# Patient Record
Sex: Female | Born: 1970
Health system: Southern US, Community
[De-identification: ages and names within clinical notes are randomized; demographics above are authoritative.]

## PROBLEM LIST (undated history)

## (undated) DIAGNOSIS — R519 Headache, unspecified: Secondary | ICD-10-CM

## (undated) DIAGNOSIS — R7303 Prediabetes: Secondary | ICD-10-CM

## (undated) DIAGNOSIS — E669 Obesity, unspecified: Secondary | ICD-10-CM

## (undated) DIAGNOSIS — Z87891 Personal history of nicotine dependence: Secondary | ICD-10-CM

## (undated) DIAGNOSIS — M549 Dorsalgia, unspecified: Secondary | ICD-10-CM

## (undated) DIAGNOSIS — M5010 Cervical disc disorder with radiculopathy, unspecified cervical region: Secondary | ICD-10-CM

## (undated) DIAGNOSIS — D649 Anemia, unspecified: Secondary | ICD-10-CM

## (undated) DIAGNOSIS — Z9889 Other specified postprocedural states: Secondary | ICD-10-CM

## (undated) DIAGNOSIS — J45909 Unspecified asthma, uncomplicated: Secondary | ICD-10-CM

## (undated) DIAGNOSIS — K635 Polyp of colon: Secondary | ICD-10-CM

## (undated) DIAGNOSIS — D72829 Elevated white blood cell count, unspecified: Secondary | ICD-10-CM

## (undated) DIAGNOSIS — K353 Acute appendicitis with localized peritonitis, without perforation or gangrene: Secondary | ICD-10-CM

## (undated) DIAGNOSIS — M51369 Other intervertebral disc degeneration, lumbar region without mention of lumbar back pain or lower extremity pain: Secondary | ICD-10-CM

## (undated) DIAGNOSIS — T8859XA Other complications of anesthesia, initial encounter: Secondary | ICD-10-CM

## (undated) DIAGNOSIS — F909 Attention-deficit hyperactivity disorder, unspecified type: Secondary | ICD-10-CM

## (undated) DIAGNOSIS — R112 Nausea with vomiting, unspecified: Secondary | ICD-10-CM

## (undated) DIAGNOSIS — M4802 Spinal stenosis, cervical region: Secondary | ICD-10-CM

## (undated) DIAGNOSIS — K219 Gastro-esophageal reflux disease without esophagitis: Secondary | ICD-10-CM

## (undated) HISTORY — DX: Attention-deficit hyperactivity disorder, unspecified type: F90.9

## (undated) HISTORY — PX: CHOLECYSTECTOMY: SHX55

## (undated) HISTORY — PX: HERNIA REPAIR: SHX51

## (undated) HISTORY — PX: BIOPSY BREAST: PRO8

## (undated) HISTORY — DX: Acute appendicitis with localized peritonitis, without perforation or gangrene: K35.30

---

## 2015-02-05 DIAGNOSIS — F909 Attention-deficit hyperactivity disorder, unspecified type: Secondary | ICD-10-CM

## 2015-02-05 HISTORY — DX: Attention-deficit hyperactivity disorder, unspecified type: F90.9

## 2015-09-05 ENCOUNTER — Emergency Department
Admission: EM | Admit: 2015-09-05 | Discharge: 2015-09-05 | Disposition: A | Discharge status: Home / Self Care | Payer: Medicaid Other | Attending: Emergency Medicine | Admitting: Emergency Medicine

## 2015-09-05 ENCOUNTER — Encounter: Payer: Self-pay | Admitting: Emergency Medicine

## 2015-09-05 DIAGNOSIS — Z79899 Other long term (current) drug therapy: Secondary | ICD-10-CM | POA: Insufficient documentation

## 2015-09-05 DIAGNOSIS — M545 Low back pain, unspecified: Secondary | ICD-10-CM

## 2015-09-05 DIAGNOSIS — F17210 Nicotine dependence, cigarettes, uncomplicated: Secondary | ICD-10-CM | POA: Insufficient documentation

## 2015-09-05 DIAGNOSIS — G8929 Other chronic pain: Secondary | ICD-10-CM | POA: Insufficient documentation

## 2015-09-05 HISTORY — DX: Dorsalgia, unspecified: M54.9

## 2015-09-05 MED ORDER — PREGABALIN 150 MG PO CAPS: 150.0000 mg | ORAL_CAPSULE | Freq: Two times a day (BID) | ORAL | Status: DC

## 2015-09-05 MED ORDER — BACLOFEN 10 MG PO TABS: 10.0000 mg | ORAL_TABLET | Freq: Two times a day (BID) | ORAL | Status: DC

## 2015-09-05 MED ORDER — HYDROCODONE-ACETAMINOPHEN 5-325 MG PO TABS: 1.0000 | ORAL_TABLET | Freq: Two times a day (BID) | ORAL | Status: DC | PRN

## 2015-09-05 NOTE — ED Notes (Signed)
Low back pain increasing over past week. History of disc problems and back pain. No new injury.

## 2015-09-05 NOTE — Discharge Instructions (Signed)
Chronic Back Pain  When back pain lasts longer than 3 months, it is called chronic back pain.People with chronic back pain often go through certain periods that are more intense (flare-ups).  CAUSES Chronic back pain can be caused by wear and tear (degeneration) on different structures in your back. These structures include:  The bones of your spine (vertebrae) and the joints surrounding your spinal cord and nerve roots (facets).  The strong, fibrous tissues that connect your vertebrae (ligaments). Degeneration of these structures may result in pressure on your nerves. This can lead to constant pain. HOME CARE INSTRUCTIONS  Avoid bending, heavy lifting, prolonged sitting, and activities which make the problem worse.  Take brief periods of rest throughout the day to reduce your pain. Lying down or standing usually is better than sitting while you are resting.  Take over-the-counter or prescription medicines only as directed by your caregiver. SEEK IMMEDIATE MEDICAL CARE IF:   You have weakness or numbness in one of your legs or feet.  You have trouble controlling your bladder or bowels.  You have nausea, vomiting, abdominal pain, shortness of breath, or fainting.   This information is not intended to replace advice given to you by your health care provider. Make sure you discuss any questions you have with your health care provider.   Document Released: 11/02/2004 Document Revised: 12/18/2011 Document Reviewed: 03/15/2015 Elsevier Interactive Patient Education [employer].  Emergency care providers appreciate that many patients coming to [place] are in severe pain and we wish to address their pain in the safest, most responsible manner.  It is important to recognize, however, that the proper treatment of chronic pain differs from that of the pain of injuries and acute illnesses.  Our goal is to provider quality, safe, personalized care and we thank you for giving [place] the opportunity to  serve you.  The use of narcotics and related agents for chronic pain syndromes may lead to additional physical and psychological problems.  Nearly as many people die from prescription narcotics each year as die from car crashes.  Additionally, this risk is increased if such prescriptions are obtained from a variety of sources.  Therefore, only your primary care physician or a pain management specialist is able to safely treat such syndromes with narcotic medications long-term.  Documentation revealing such prescriptions have been sought from multiple sources may prohibit [place] from providing a refill or different narcotic medication.  Your name may be checked first through the [place].  This database is a record of controlled substance medication prescriptions that the patient has received.  This has been established by [facility] in an effort to eliminate the dangerous, and often life-threatening, practice of obtaining multiple prescriptions from different medical providers.  If you have a chronic pain syndrome (i.e. chronic headaches, recurrent back or neck pain, dental pain, abdominal or pelvic pain without a specific diagnosis, or neuropathic pain such as fibromyalgia) or recurrent visits for the same condition without an acute diagnosis, you may be treated with non-narcotics and other non-addictive medicines.  Allergic reactions or negative side effects that may be reported by a patient to such medications will not typically lead to the use of a narcotic analgesic or other controlled substance as an alternative.  Patients managing chronic pain with a personal physician should have provisions in place for breakthrough pain.  If you are in crisis, you should call your physician.  If your physician directs you to the emergency department,  please have the doctor call and speak to our attending physician concerning your care.  When patients come to the  Emergency Department (ED) with acute medical conditions in which the ED physician feels it is appropriate to prescribe narcotic or sedating pain medication, the physician will prescribe these in very limited quantities.  The amount of these medications will last only until you can see your primary care physician in his/her office.  Any patient who returns to the ED seeking refills should expect only non-narcotic pain medications.  In the event an acute medical condition exists and the emergency physician feels it is necessary that the patient be given a narcotic or sedating medication, a responsible adult driver should be present in the room prior to the medication being given by the nurse.  Prescriptions for narcotic or sedating medications that have been lost, stolen, or expired will NOT be refilled in the ED.  Patients who have chronic pain may receive non-narcotic prescriptions until seen by their primary care physician.  It is every patient's personal responsibility to maintain active prescriptions with his or her primary care physician or specialist.  %%%%%%%%%%%%%%%%%%%%%%%%%%%%%%%%%%%%%%%%%%%%%%%%%%%%%%%%%%%%%%%%%%%%%%%%%%%%%%%%%% Take the prescription meds as directed. You MUST follow-up with a local clinic to establish care and for routine pain management. Follow-up with the [facility] in [place] for further care management. You may not be provided with narcotic pain medicines if you return to the ED.

## 2015-09-05 NOTE — ED Notes (Signed)
States increasing back pain x 1 week. Slipped getting in tub. History of disc problems.

## 2015-09-05 NOTE — ED Notes (Signed)
Patient states that she has 2 torn disc in her back, one at L4, L5, and L5, S1. [place]. Patient has been out of her meds for the last month due to lack of insurance. Over the last week her pain has increased. Patient says that she was taking Lyrica 150 mg BID, Baclofen 10 mg, Lortab 5 mg, and Hysungla 20 mg.

## 2015-09-05 NOTE — ED Provider Notes (Signed)
[facility] Emergency Department Provider Note ____________________________________________  Time seen: 1615  I have reviewed the triage vital signs and the nursing notes.  HISTORY  Chief Complaint  Back Pain  HPI Beverly Wright is a 44 y.o. female reports to the ED for evaluation and treatment of her chronic back pain. She gives a history of multilevel lumbar discannular tears. She has recently relocated here from [place], and has not reestablished care with a local PM & R provider. She gives a list of her previous medications in triage, and notes that it has been at least a month since she has dose of medications. She has in the interim been taking Aleve as needed for pain. Her pain is recently decreased just above her baseline about a week ago after she slipped and nearly fell while getting into the bathtub. She rates her baseline pain at about a 5 out of 10, but notes that her current acute flare pain is elevated her level to a 10 out of 10. She denies any new symptoms, distal paresthesias, incontinence, or foot drop.  Past Medical History  Diagnosis Date  . Back pain    There are no active problems to display for this patient.  Past Surgical History  Procedure Laterality Date  . Lumbar disc surgery      Current Outpatient Rx  Name  Route  Sig  Dispense  Refill  . baclofen (LIORESAL) 10 MG tablet   Oral   Take 10 mg by mouth 3 (three) times daily.         Marland Kitchen HYDROcodone Bitartrate ER (HYSINGLA ER) 20 MG T24A   Oral   Take 20 mg by mouth 1 day or 1 dose.         Marland Kitchen HYDROcodone-acetaminophen (NORCO/VICODIN) 5-325 MG tablet   Oral   Take 1 tablet by mouth every 6 (six) hours as needed for moderate pain.         . pregabalin (LYRICA) 150 MG capsule   Oral   Take 150 mg by mouth 2 (two) times daily.         . baclofen (LIORESAL) 10 MG tablet   Oral   Take 1 tablet (10 mg total) by mouth 2 (two) times daily.   20 each   0   .  HYDROcodone-acetaminophen (NORCO) 5-325 MG tablet   Oral   Take 1 tablet by mouth every 12 (twelve) hours as needed for moderate pain.   10 tablet   0   . pregabalin (LYRICA) 150 MG capsule   Oral   Take 1 capsule (150 mg total) by mouth 2 (two) times daily.   15 capsule   0    Allergies Review of patient's allergies indicates no known allergies.  No family history on file.  Social History Social History  Substance Use Topics  . Smoking status: Current Every Day Smoker -- 0.50 packs/day    Types: Cigarettes  . Smokeless tobacco: None  . Alcohol Use: No   Review of Systems  Constitutional: Negative for fever. Eyes: Negative for visual changes. ENT: Negative for sore throat. Cardiovascular: Negative for chest pain. Respiratory: Negative for shortness of breath. Gastrointestinal: Negative for abdominal pain, vomiting and diarrhea. Genitourinary: Negative for dysuria. Musculoskeletal: Positive for back pain. Skin: Negative for rash. Neurological: Negative for headaches, focal weakness or numbness. ____________________________________________  PHYSICAL EXAM:  VITAL SIGNS: ED Triage Vitals  Enc Vitals Group     BP 09/05/15 1456 141/93 mmHg  Pulse Rate 09/05/15 1456 87     Resp 09/05/15 1456 20     Temp 09/05/15 1456 98 F (36.7 C)     Temp Source 09/05/15 1456 Oral     SpO2 09/05/15 1456 98 %     Weight 09/05/15 1456 190 lb (86.183 kg)     Height 09/05/15 1456 5\' 7"  (1.702 m)     Head Cir --      Peak Flow --      Pain Score 09/05/15 1456 10     Pain Loc --      Pain Edu? --      Excl. in [place]? --    Constitutional: Alert and oriented. Well appearing and in no distress. Head: Normocephalic and atraumatic.      Eyes: Conjunctivae are normal. PERRL. Normal extraocular movements      Ears: Canals clear. TMs intact bilaterally.   Nose: No congestion/rhinorrhea.   Mouth/Throat: Mucous membranes are moist.   Neck: Supple. No  thyromegaly. Hematological/Lymphatic/Immunological: No cervical lymphadenopathy. Cardiovascular: Normal rate, regular rhythm.  Respiratory: Normal respiratory effort. No wheezes/rales/rhonchi. Gastrointestinal: Soft and nontender. No distention. Musculoskeletal: Lumbar spine without midline tenderness, spasm, or step-off. Negative straight leg raise in seated position. Normal transition from sit to stand. Nontender with normal range of motion in all extremities.  Neurologic:  Cranial nerves II through XII grossly intact. Normal LE DTRs bilaterally. Patient with normal toe dorsiflexion and foot eversion on exam. Normal gait without ataxia. Normal speech and language. No gross focal neurologic deficits are appreciated. Skin:  Skin is warm, dry and intact. No rash noted. Psychiatric: Mood and affect are normal. Patient exhibits appropriate insight and judgment. ____________________________________________  INITIAL IMPRESSION / ASSESSMENT AND PLAN / ED COURSE  Patient with acute flare for chronic low back pain presents to the ED for medication management. She is provided with a 5 day course of baclofen, Lyrica, and hydrocodone. She is provided with a copy of the ED's chronic pain management policy as well. She is encouraged to establish care with a local community clinic, for interim medical management. She is also encouraged to establish an appointment with a local physical medicine and rehabilitation provider pending the January 1 effective date of her new medical insurance. She is advised that future request for narcotic pain medicines do not be provided through this ED. ____________________________________________  FINAL CLINICAL IMPRESSION(S) / ED DIAGNOSES  Final diagnoses:  Chronic low back pain      Melvenia Needles, PA-C 09/05/15 2226  Lisa Roca, MD 09/05/15 2230

## 2015-09-29 ENCOUNTER — Encounter: Payer: Self-pay | Admitting: Family Medicine

## 2015-09-29 ENCOUNTER — Ambulatory Visit: Payer: Self-pay | Attending: Family Medicine | Admitting: Family Medicine

## 2015-09-29 VITALS — BP 125/87 | HR 86 | Temp 98.4°F | Resp 16 | Ht 67.0 in | Wt 231.0 lb

## 2015-09-29 DIAGNOSIS — M5441 Lumbago with sciatica, right side: Secondary | ICD-10-CM | POA: Insufficient documentation

## 2015-09-29 DIAGNOSIS — G8929 Other chronic pain: Secondary | ICD-10-CM | POA: Insufficient documentation

## 2015-09-29 DIAGNOSIS — F17210 Nicotine dependence, cigarettes, uncomplicated: Secondary | ICD-10-CM | POA: Insufficient documentation

## 2015-09-29 DIAGNOSIS — F902 Attention-deficit hyperactivity disorder, combined type: Secondary | ICD-10-CM | POA: Insufficient documentation

## 2015-09-29 DIAGNOSIS — M5442 Lumbago with sciatica, left side: Secondary | ICD-10-CM | POA: Insufficient documentation

## 2015-09-29 MED ORDER — GABAPENTIN 300 MG PO CAPS: 300.0000 mg | ORAL_CAPSULE | Freq: Two times a day (BID) | ORAL | Status: DC

## 2015-09-29 MED ORDER — BACLOFEN 10 MG PO TABS: 10.0000 mg | ORAL_TABLET | Freq: Three times a day (TID) | ORAL | Status: DC

## 2015-09-29 MED ORDER — ACETAMINOPHEN-CODEINE #3 300-30 MG PO TABS: 1.0000 | ORAL_TABLET | Freq: Two times a day (BID) | ORAL | Status: DC

## 2015-09-29 MED ORDER — PREGABALIN 150 MG PO CAPS: 150.0000 mg | ORAL_CAPSULE | Freq: Two times a day (BID) | ORAL | Status: DC

## 2015-09-29 NOTE — Patient Instructions (Addendum)
Beverly Wright was seen today for back pain.  Diagnoses and all orders for this visit:  Chronic bilateral low back pain with bilateral sciatica -     baclofen (LIORESAL) 10 MG tablet; Take 1 tablet (10 mg total) by mouth 3 (three) times daily. -     pregabalin (LYRICA) 150 MG capsule; Take 1 capsule (150 mg total) by mouth 2 (two) times daily. Reported on 09/29/2015 -     acetaminophen-codeine (TYLENOL #3) 300-30 MG tablet; Take 1-2 tablets by mouth 2 (two) times daily. -     Ambulatory referral to Pain Clinic -     gabapentin (NEURONTIN) 300 MG capsule; Take 1 capsule (300 mg total) by mouth 2 (two) times daily.  Attention deficit hyperactivity disorder (ADHD), combined type -     Ambulatory referral to Psychiatry    I will obtain medical records  gabapentin ordered for now to replace lyrica.   Please apply for PASS (medication assistance) for lyrica at the onsite pharmacy. This process takes about a month. You are also welcome to wait for your new insurance coverage to start in 2 weeks.   F/u in 4 weeks for chronic back pain   Dr. Adrian Blackwater
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
## 2015-09-29 NOTE — Assessment & Plan Note (Signed)
A; ADHD. I do not manage ADHD, patient informed  P: Psych referral placed Mental health resources provided to the following   [facility], [place] and [place].

## 2015-09-29 NOTE — Progress Notes (Signed)
HFU back pain Pain scale #7 Tobacco 1/2ppder every other day  No suicidal thoughts in the past two weeks  Medication refills Referral orthopedic

## 2015-09-29 NOTE — Assessment & Plan Note (Signed)
A; chronic pain. No acute worsening P: Patient informed that per clinic prescribing practices I prescribe tylenol #3 or tramadol only. Tylenol prescribed Baclofen refilled lyrica refilled lyrica is pricey, patient uninsured at the moment, so I also prescribed gabapentin which she can get today

## 2015-10-01 ENCOUNTER — Telehealth: Payer: Self-pay | Admitting: Clinical

## 2015-10-01 NOTE — Telephone Encounter (Signed)
Follow-up with Beverly Wright, she says she will have insurance in January (possibly [place], [place]), and wants to wait until that comes in to see psychiatry for ADHD medications. [facility] is sending her a list of ADHD resources in the [facility] area, so that she will know her options once her insurance arrives. In the meantime, she is now aware that she may call [phone]) or go to [facility] walk-in clinic, if she needs to see psychiatrist before October 09, 2014. Pt says she understands and will still wait until January 2017.

## 2015-11-19 ENCOUNTER — Encounter: Payer: Self-pay | Admitting: Primary Care

## 2015-11-19 ENCOUNTER — Other Ambulatory Visit: Payer: Self-pay | Admitting: Primary Care

## 2015-11-19 ENCOUNTER — Telehealth: Payer: Self-pay | Admitting: Primary Care

## 2015-11-19 ENCOUNTER — Telehealth: Payer: Self-pay

## 2015-11-19 ENCOUNTER — Ambulatory Visit (INDEPENDENT_AMBULATORY_CARE_PROVIDER_SITE_OTHER): Payer: BLUE CROSS/BLUE SHIELD | Admitting: Primary Care

## 2015-11-19 VITALS — BP 118/78 | HR 98 | Temp 98.0°F | Ht 66.0 in | Wt 225.0 lb

## 2015-11-19 DIAGNOSIS — J329 Chronic sinusitis, unspecified: Secondary | ICD-10-CM | POA: Diagnosis not present

## 2015-11-19 DIAGNOSIS — R062 Wheezing: Secondary | ICD-10-CM

## 2015-11-19 DIAGNOSIS — B349 Viral infection, unspecified: Secondary | ICD-10-CM | POA: Diagnosis not present

## 2015-11-19 DIAGNOSIS — B9789 Other viral agents as the cause of diseases classified elsewhere: Secondary | ICD-10-CM

## 2015-11-19 MED ORDER — ALBUTEROL SULFATE HFA 108 (90 BASE) MCG/ACT IN AERS: 2.0000 | INHALATION_SPRAY | Freq: Four times a day (QID) | RESPIRATORY_TRACT | Status: DC | PRN

## 2015-11-19 MED ORDER — PREDNISONE 10 MG PO TABS: ORAL_TABLET | ORAL | Status: DC

## 2015-11-19 MED ORDER — BENZONATATE 200 MG PO CAPS: 200.0000 mg | ORAL_CAPSULE | Freq: Three times a day (TID) | ORAL | Status: DC | PRN

## 2015-11-19 NOTE — Telephone Encounter (Signed)
Received ADHD records, tested positive for ADHD in April 2016. Results sent off for scanning.

## 2015-11-19 NOTE — Progress Notes (Signed)
Pre visit review using our clinic review tool, if applicable. No additional management support is needed unless otherwise documented below in the visit note. 

## 2015-11-19 NOTE — Patient Instructions (Signed)
Your symptoms are likely related to a viral infection which cannot be treated with antibiotics.  You may take Benzonatate capsules for cough. Take 1 capsule by mouth three times daily as needed for cough.  Start Prednisone tablets to help reduce pressure and congestion. Take 3 tablets for 2 days, then 2 tablets for 2 days, then 1 tablet for 2 days.  Try using Neti Pot rinses to help flush out nasal cavity.  Increase consumption of water and rest.  It was a pleasure meeting you!

## 2015-11-19 NOTE — Telephone Encounter (Signed)
I already sent this into the pharmacy she has listed. We may need to call the pharmacy to have them cancel in order for insurance to run through. Will you please call [facility] to cancel? I've sent in to [facility] on [address].

## 2015-11-19 NOTE — Progress Notes (Signed)
Subjective:    Patient ID: Leanord Asal, female    DOB: [birth year], 45 y.o.   MRN: [id number]  HPI  Ms. Friebel is a 45 year old female who presents today with a chief complaint of cough. She is scheduled to establish care next week as a new patient. She also reports shortness of breath, chest congestion, sinus pressure, wheezing, sore throat, ear pain. Her symptoms have been present for the past 3 days. She is a current smoker. Her cough is non productive and her nasal mucous is yellow/green. Denies fevers. She's taken Nyquil at night with some improvement. She's been around her grandson and husband who had similar symptoms.   Review of Systems  Constitutional: Negative for fever, chills and fatigue.  HENT: Positive for congestion, ear pain, sinus pressure and sore throat.   Respiratory: Positive for cough, shortness of breath and wheezing.   Cardiovascular: Negative for chest pain.  Gastrointestinal: Negative for nausea.       Past Medical History  Diagnosis Date  . Back pain   . Attention deficit hyperactivity disorder (ADHD) 02/05/2015    dx age 68     Social History   Social History  . Marital Status: Married    Spouse Name: N/A  . Number of Children: N/A  . Years of Education: N/A   Occupational History  . Not on file.   Social History Main Topics  . Smoking status: Current Every Day Smoker -- 0.50 packs/day    Types: Cigarettes  . Smokeless tobacco: Not on file  . Alcohol Use: No  . Drug Use: No  . Sexual Activity: Not on file   Other Topics Concern  . Not on file   Social History Narrative    Past Surgical History  Procedure Laterality Date  . Lumbar disc surgery  2014    No family history on file.  No Known Allergies  Current Outpatient Prescriptions on File Prior to Visit  Medication Sig Dispense Refill  . baclofen (LIORESAL) 10 MG tablet Take 1 tablet (10 mg total) by mouth 3 (three) times daily. 90 each 2  . pregabalin (LYRICA) 150 MG  capsule Take 1 capsule (150 mg total) by mouth 2 (two) times daily. Reported on 09/29/2015 60 capsule 2   No current facility-administered medications on file prior to visit.    BP 118/78 mmHg  Pulse 98  Temp(Src) 98 F (36.7 C) (Oral)  Ht 5\' 6"  (1.676 m)  Wt 225 lb (102.059 kg)  BMI 36.33 kg/m2  SpO2 99%    Objective:   Physical Exam  Constitutional: She appears well-nourished.  HENT:  Right Ear: Ear canal normal.  Left Ear: Ear canal normal.  Nose: Right sinus exhibits maxillary sinus tenderness and frontal sinus tenderness. Left sinus exhibits maxillary sinus tenderness and frontal sinus tenderness.  Mouth/Throat: Posterior oropharyngeal erythema present. No oropharyngeal exudate or posterior oropharyngeal edema.  Dullness to bilateral TM's  Eyes: Conjunctivae are normal.  Neck: Neck supple.  Cardiovascular: Normal rate and regular rhythm.   Pulmonary/Chest: She has wheezes in the right upper field. She has rhonchi in the right upper field.  Lymphadenopathy:    She has no cervical adenopathy.  Skin: Skin is warm and dry.          Assessment & Plan:  Viral Sinusitis:  Nasal congestion, sinus pressure, cough x 3 days. Improvement with Nyquil at night.  Exam with tenderness to maxillary sinuses, mild rhonchi and wheezing to RUF. Does not appear toxic. Vitals  stable. Suspect viral involvement at this point and will treat with supportive measures. RX for prednisone taper and tessalon pearls provided. Neti pot rinses, flonase, fluids, rest. Will re-evaluate next week during new patient appointment.

## 2015-11-19 NOTE — Telephone Encounter (Signed)
Spoke with  Lissa Merlin at [facility]; albuterol inhaler rx has not gotten to pharmacy electronically yet but Lissa Merlin will make note to cancel rx when comes in.

## 2015-11-19 NOTE — Telephone Encounter (Signed)
Pt was seen earlier today and pt request inhaler to help with breathing sent to [facility]; pt request cb. Mariann Laster called pt back to let her know med is being sent to [facility].

## 2015-11-25 ENCOUNTER — Ambulatory Visit (INDEPENDENT_AMBULATORY_CARE_PROVIDER_SITE_OTHER): Payer: BLUE CROSS/BLUE SHIELD | Admitting: Primary Care

## 2015-11-25 ENCOUNTER — Encounter: Payer: Self-pay | Admitting: Primary Care

## 2015-11-25 ENCOUNTER — Telehealth: Payer: Self-pay

## 2015-11-25 ENCOUNTER — Encounter: Payer: Self-pay | Admitting: Radiology

## 2015-11-25 VITALS — BP 120/76 | HR 91 | Temp 98.1°F | Ht 66.0 in | Wt 228.8 lb

## 2015-11-25 DIAGNOSIS — G8929 Other chronic pain: Secondary | ICD-10-CM

## 2015-11-25 DIAGNOSIS — M549 Dorsalgia, unspecified: Secondary | ICD-10-CM | POA: Diagnosis not present

## 2015-11-25 DIAGNOSIS — F909 Attention-deficit hyperactivity disorder, unspecified type: Secondary | ICD-10-CM

## 2015-11-25 DIAGNOSIS — M5442 Lumbago with sciatica, left side: Secondary | ICD-10-CM | POA: Diagnosis not present

## 2015-11-25 DIAGNOSIS — J45909 Unspecified asthma, uncomplicated: Secondary | ICD-10-CM | POA: Diagnosis not present

## 2015-11-25 DIAGNOSIS — M5441 Lumbago with sciatica, right side: Secondary | ICD-10-CM

## 2015-11-25 MED ORDER — AMPHETAMINE-DEXTROAMPHET ER 20 MG PO CP24: 40.0000 mg | ORAL_CAPSULE | ORAL | Status: DC

## 2015-11-25 MED ORDER — AMPHETAMINE-DEXTROAMPHET ER 20 MG PO CP24: 40.0000 mg | ORAL_CAPSULE | Freq: Every day | ORAL | Status: DC

## 2015-11-25 MED ORDER — CETIRIZINE HCL 10 MG PO TABS: 10.0000 mg | ORAL_TABLET | Freq: Every day | ORAL | Status: DC

## 2015-11-25 NOTE — Progress Notes (Signed)
Pre visit review using our clinic review tool, if applicable. No additional management support is needed unless otherwise documented below in the visit note. 

## 2015-11-25 NOTE — Assessment & Plan Note (Signed)
Presents with completed ADHD testing done in April 2016, which was scanned into chart. Refill provided for ADHD medications. Discussed that any changes in symptoms will have to be evaluated by psychology. UDS and controlled substance contract obtained.

## 2015-11-25 NOTE — Progress Notes (Signed)
Subjective:    Patient ID: Beverly Wright, female    DOB: [birth year], 45 y.o.   MRN: [id number]  HPI  Beverly Wright is a 45 year old female who presents today to establish care and discuss the problems mentioned below. Will obtain old records. Her last physical was in March 2016.  1) ADHD: Diagnosed in April 2016. She is currently managed on Adderall XR 20 mg every morning. She feels well managed. She brought in her ADHD testing report from April 2016 which was scanned into the chart.  2) Chronic Back Pain: Long standing history of pain. Xray and MRI's with evidence She is currently managed on Lyrica 150 mg and baclofen 10 mg. She underwent IDET surgery in 2004 which helped with pain for years. She fell in 2009 which caused her pain to return. She was managing with a spine specialist up until just recently since she's moved to [place] recently. She is working to lose weight through healthy diet and exercise. She's recetnly started working out at [employer] and is having breakthrough pain during work outs and is requesting pain medication. According to the [place] Controlled Substance Registry she was evaluated at the [facility] and [facility] and was provided with tylenol with codeine in December 2016.  3) Asthma: Diagnosed in youth. No recent issues until move to [place]. She has an albuterol inhaler that was sent to her pharmacy last week due to wheezing from viral URI. She's been using her inhaler every 6 hours as she will experience SOB, denies wheezing. She is not currently managed on an antihiamine. No recent spirometry or PFT's.  Review of Systems  Constitutional: Negative for fever and chills.  HENT: Negative for rhinorrhea.   Respiratory: Positive for shortness of breath. Negative for cough and wheezing.   Cardiovascular: Negative for chest pain.  Genitourinary: Negative for difficulty urinating.  Musculoskeletal: Positive for back pain.       Chronic back pain    Allergic/Immunologic: Positive for environmental allergies.  Neurological: Negative for dizziness and headaches.  Psychiatric/Behavioral:       Currently managed on Adderall XR, feels well managed.       Past Medical History  Diagnosis Date  . Back pain   . Attention deficit hyperactivity disorder (ADHD) 02/05/2015    dx age 87     Social History   Social History  . Marital Status: Married    Spouse Name: N/A  . Number of Children: N/A  . Years of Education: N/A   Occupational History  . Not on file.   Social History Main Topics  . Smoking status: Current Every Day Smoker -- 0.50 packs/day    Types: Cigarettes  . Smokeless tobacco: Not on file  . Alcohol Use: No  . Drug Use: No  . Sexual Activity: Not on file   Other Topics Concern  . Not on file   Social History Narrative   Married.   Has custody of her grandson.   Works as a Programme researcher, broadcasting/film/video.   Also aspires to go to nursing school    No past surgical history on file.  Family History  Problem Relation Age of Onset  . Other      adopted    No Known Allergies  Current Outpatient Prescriptions on File Prior to Visit  Medication Sig Dispense Refill  . albuterol (PROVENTIL HFA;VENTOLIN HFA) 108 (90 Base) MCG/ACT inhaler Inhale 2 puffs into the lungs every 6 (six) hours as needed for wheezing or shortness  of breath. 1 Inhaler 2  . pregabalin (LYRICA) 150 MG capsule Take 1 capsule (150 mg total) by mouth 2 (two) times daily. Reported on 09/29/2015 60 capsule 2   No current facility-administered medications on file prior to visit.    BP 120/76 mmHg  Pulse 91  Temp(Src) 98.1 F (36.7 C) (Oral)  Ht 5\' 6"  (1.676 m)  Wt 228 lb 12.8 oz (103.783 kg)  BMI 36.95 kg/m2  SpO2 98%    Objective:   Physical Exam  Constitutional: She appears well-nourished.  HENT:  Right Ear: Tympanic membrane and ear canal normal.  Left Ear: Tympanic membrane and ear canal normal.  Nose: Nose normal.  Mouth/Throat: Oropharynx is  clear and moist.  Eyes: Conjunctivae are normal.  Neck: Neck supple.  Cardiovascular: Normal rate and regular rhythm.   Pulmonary/Chest: Effort normal and breath sounds normal. She has no wheezes. She has no rales.  Skin: Skin is warm and dry.  Psychiatric: She has a normal mood and affect.          Assessment & Plan:

## 2015-11-25 NOTE — Telephone Encounter (Signed)
Patient called back asking about the PA on Adderall XR. Let her know I am waiting for response.  Sent the Prior Auth and waiting on response.

## 2015-11-25 NOTE — Telephone Encounter (Signed)
Pt request cb about prior auth for Adderall XR. Pt said pharmacy has faxed request to [facility]. Pt request cb.

## 2015-11-25 NOTE — Patient Instructions (Addendum)
Stop by the lab to complete the contract and urine drug screen.  Please schedule a physical with me in the Spring or Summer this year. You may also schedule a lab only appointment 3-4 days prior. We will discuss your lab results in detail during your physical.  Start Zyrtec antihistamine for allergies and asthma symptoms. Take 1 tablet by mouth at bedtime.  Please notify me if you continue to require your inhaler more than 2-3 times weekly.  It was a pleasure to see you today!

## 2015-11-25 NOTE — Assessment & Plan Note (Signed)
Long standing history. MRI with L4-5 annual tear. She was evaluated by [facility] and prescribed tylenol with codeine. (She did not mention this today during visit). Dicussed that I do not manage chronic back pain with narcotics and provided referral to pain management. Referral placed.

## 2015-11-25 NOTE — Assessment & Plan Note (Signed)
History of since youth. Recently some SOB since moving to [place]. Recently also with URI and is using albuterol frequently. Discussed that this is to be used sparingly. Start Zyrtec HS. Will complete spirometry at physical. She is to notify me if symptoms persist.

## 2015-11-26 ENCOUNTER — Other Ambulatory Visit: Payer: Self-pay | Admitting: Primary Care

## 2015-11-26 NOTE — Telephone Encounter (Signed)
Pt called and said she spoke with insurance company.  The "form" that was faxed yesterday for prior auth is incorrect according to [facility].  They have re-faxed a new form 11/26/15, and are requesting that the new form be completed and faxed back to them.  The [phone] is the best number to call insurance company if any questions.  Also insurance requests to put XL on the form not just adderall.

## 2015-11-26 NOTE — Telephone Encounter (Signed)
Noted. And I did sent both  to [facility] yesterday. Waiting on Beverly Wright to look at over the questions on the form. Beverly Wright is still seeing patients.

## 2015-11-26 NOTE — Telephone Encounter (Signed)
Faxed form to [facility].

## 2015-11-29 NOTE — Telephone Encounter (Signed)
Received fax confirmation that Adderall XR 20 mg has been approved on 10/26/2015. Already called patient on Friday, 11/26/2015 that prescription has been approved.   [employer] of [place].  Reference number: [id number] Effective dates of authorization: 20/17/2017-20/16/2018

## 2015-12-22 ENCOUNTER — Telehealth: Payer: Self-pay | Admitting: Primary Care

## 2015-12-22 DIAGNOSIS — G8929 Other chronic pain: Secondary | ICD-10-CM

## 2015-12-22 DIAGNOSIS — M549 Dorsalgia, unspecified: Principal | ICD-10-CM

## 2015-12-22 NOTE — Telephone Encounter (Signed)
Noted. Not sure what we can do, but new referral placed. Rosaria Ferries, are we able to get her connected with another pain clinic?

## 2015-12-22 NOTE — Telephone Encounter (Signed)
Pt called wanted to get referred to a different pain management clinic She was referred to [facility].  She stated this is not working out for her.  They put on a strong pain med morphine 15mg   that she could not take and she wanted something that wasn't as strong

## 2015-12-23 NOTE — Telephone Encounter (Signed)
Spoke with patient and patient wants a new Pain clinic appointment. We will refer to [facility], sent referral through Epic and gave the patient all their contact information, she will await their call.

## 2016-01-17 ENCOUNTER — Other Ambulatory Visit: Payer: Self-pay

## 2016-01-17 ENCOUNTER — Telehealth: Payer: Self-pay | Admitting: Primary Care

## 2016-01-17 DIAGNOSIS — M5442 Lumbago with sciatica, left side: Principal | ICD-10-CM

## 2016-01-17 DIAGNOSIS — G8929 Other chronic pain: Secondary | ICD-10-CM

## 2016-01-17 DIAGNOSIS — M5441 Lumbago with sciatica, right side: Principal | ICD-10-CM

## 2016-01-17 MED ORDER — PREGABALIN 150 MG PO CAPS: 150.0000 mg | ORAL_CAPSULE | Freq: Two times a day (BID) | ORAL | Status: DC

## 2016-01-17 NOTE — Telephone Encounter (Signed)
Spoken to patient. She stated that her appointment with pain management in 02/10/2016. Patient did not get the Lyrica until January because her new insurance did not get into effect until then. Patient stated that Lyrica does help with her sciatica.

## 2016-01-17 NOTE — Telephone Encounter (Signed)
Pt left v/m requesting refill lyrica; last refilled # 60 x 2 on 09/29/15 by Dr Adrian Blackwater; pt established care on 11/25/15 and appears was referred out for management of back pain.Please advise.

## 2016-01-17 NOTE — Telephone Encounter (Signed)
Will offer 1 refill until she can be evaluated by pain management. Please call in Lyrica 150 mg. Take 1 tablet by mouth twice daily. #60. No refills.

## 2016-01-17 NOTE — Telephone Encounter (Signed)
Received refill request for Lyrica.  Has she been evaluated by [facility] pain management? What's the current plan of treatment for her pain? When's the last time she had her Lyrica, I see this was prescribed last in December 2016 with 2 refills for which would only last until late February. Did the lyrica help with her sciatica?

## 2016-01-18 NOTE — Telephone Encounter (Signed)
Called and notified patient of prescription called in to [facility]. Patient verbalized understanding.

## 2016-01-18 NOTE — Telephone Encounter (Signed)
Called in Lyrica to [facility].

## 2016-02-08 ENCOUNTER — Telehealth: Payer: Self-pay

## 2016-02-08 NOTE — Telephone Encounter (Signed)
Pt will be due for adderall on 02/22/16 and pt has CPX scheduled for 03/01/16; Anda Kraft will be out of the office next week and not returning until the 16th. Pt will call on 02/18/16 and request adderall rx and will request done by another provider. Pt voiced understanding.

## 2016-02-10 ENCOUNTER — Encounter: Payer: Self-pay | Admitting: Pain Medicine

## 2016-02-10 ENCOUNTER — Ambulatory Visit: Payer: BLUE CROSS/BLUE SHIELD | Attending: Pain Medicine | Admitting: Pain Medicine

## 2016-02-10 ENCOUNTER — Encounter (INDEPENDENT_AMBULATORY_CARE_PROVIDER_SITE_OTHER): Payer: Self-pay

## 2016-02-10 VITALS — BP 128/88 | HR 91 | Temp 98.1°F | Resp 16 | Ht 67.0 in | Wt 215.0 lb

## 2016-02-10 DIAGNOSIS — M1990 Unspecified osteoarthritis, unspecified site: Secondary | ICD-10-CM | POA: Insufficient documentation

## 2016-02-10 DIAGNOSIS — M5136 Other intervertebral disc degeneration, lumbar region: Secondary | ICD-10-CM | POA: Insufficient documentation

## 2016-02-10 DIAGNOSIS — J45909 Unspecified asthma, uncomplicated: Secondary | ICD-10-CM | POA: Insufficient documentation

## 2016-02-10 DIAGNOSIS — M533 Sacrococcygeal disorders, not elsewhere classified: Secondary | ICD-10-CM | POA: Insufficient documentation

## 2016-02-10 DIAGNOSIS — R200 Anesthesia of skin: Secondary | ICD-10-CM | POA: Diagnosis not present

## 2016-02-10 DIAGNOSIS — F17210 Nicotine dependence, cigarettes, uncomplicated: Secondary | ICD-10-CM | POA: Insufficient documentation

## 2016-02-10 DIAGNOSIS — M1288 Other specific arthropathies, not elsewhere classified, other specified site: Secondary | ICD-10-CM | POA: Diagnosis not present

## 2016-02-10 DIAGNOSIS — K219 Gastro-esophageal reflux disease without esophagitis: Secondary | ICD-10-CM | POA: Diagnosis not present

## 2016-02-10 DIAGNOSIS — F909 Attention-deficit hyperactivity disorder, unspecified type: Secondary | ICD-10-CM | POA: Insufficient documentation

## 2016-02-10 DIAGNOSIS — M542 Cervicalgia: Secondary | ICD-10-CM | POA: Diagnosis not present

## 2016-02-10 DIAGNOSIS — M79604 Pain in right leg: Secondary | ICD-10-CM | POA: Diagnosis present

## 2016-02-10 DIAGNOSIS — M4726 Other spondylosis with radiculopathy, lumbar region: Secondary | ICD-10-CM | POA: Diagnosis not present

## 2016-02-10 DIAGNOSIS — M545 Low back pain: Secondary | ICD-10-CM | POA: Diagnosis present

## 2016-02-10 DIAGNOSIS — M5126 Other intervertebral disc displacement, lumbar region: Secondary | ICD-10-CM | POA: Insufficient documentation

## 2016-02-10 DIAGNOSIS — M79605 Pain in left leg: Secondary | ICD-10-CM | POA: Diagnosis present

## 2016-02-10 DIAGNOSIS — M5116 Intervertebral disc disorders with radiculopathy, lumbar region: Secondary | ICD-10-CM | POA: Insufficient documentation

## 2016-02-10 DIAGNOSIS — M51369 Other intervertebral disc degeneration, lumbar region without mention of lumbar back pain or lower extremity pain: Secondary | ICD-10-CM | POA: Insufficient documentation

## 2016-02-10 DIAGNOSIS — M5416 Radiculopathy, lumbar region: Secondary | ICD-10-CM | POA: Insufficient documentation

## 2016-02-10 DIAGNOSIS — M47816 Spondylosis without myelopathy or radiculopathy, lumbar region: Secondary | ICD-10-CM | POA: Insufficient documentation

## 2016-02-10 NOTE — Patient Instructions (Addendum)
PLAN  Continue present medication  Lumbar facet, medial branch nerve blocks to be performed at time of return appointment  F/U PCP K Clark for evaliation of  BP and general medical  condition  F/U surgical evaluation. May consider pending follow-up evaluations and  pending results of lumbar MRI  Ask the secretary and nurses the date of your lumbar MRI  F/U neurological evaluation. May consider PNCV/EMG studies and other studies pending follow-up evaluations  May consider radiofrequency rhizolysis or intraspinal procedures pending response to present treatment and F/U evaluation  Patient to call [facility] should patient have concerns prior to scheduled return appointment. Pain Management Discharge Instructions  General Discharge Instructions :  If you need to reach your doctor call: Monday-Friday 8:00 am - 4:00 pm at [phone] or toll free [phone].  After clinic hours [phone] to have operator reach doctor.  Bring all of your medication bottles to all your appointments in the pain clinic.  To cancel or reschedule your appointment with Pain Management please remember to call 24 hours in advance to avoid a fee.  Refer to the educational materials which you have been given on: General Risks, I had my Procedure. Discharge Instructions, Post Sedation.  Post Procedure Instructions:  The drugs you were given will stay in your system until tomorrow, so for the next 24 hours you should not drive, make any legal decisions or drink any alcoholic beverages.  You may eat anything you prefer, but it is better to start with liquids then soups and crackers, and gradually work up to solid foods.  Please notify your doctor immediately if you have any unusual bleeding, trouble breathing or pain that is not related to your normal pain.  Depending on the type of procedure that was done, some parts of your body may feel week and/or numb.  This usually clears up by tonight or  the next day.  Walk with the use of an assistive device or accompanied by an adult for the 24 hours.  You may use ice on the affected area for the first 24 hours.  Put ice in a Ziploc bag and cover with a towel and place against area 15 minutes on 15 minutes off.  You may switch to heat after 24 hours.GENERAL RISKS AND COMPLICATIONS  What are the risk, side effects and possible complications? Generally speaking, most procedures are safe.  However, with any procedure there are risks, side effects, and the possibility of complications.  The risks and complications are dependent upon the sites that are lesioned, or the type of nerve block to be performed.  The closer the procedure is to the spine, the more serious the risks are.  Great care is taken when placing the radio frequency needles, block needles or lesioning probes, but sometimes complications can occur. 1. Infection: Any time there is an injection through the skin, there is a risk of infection.  This is why sterile conditions are used for these blocks.  There are four possible types of infection. 1. Localized skin infection. 2. Central Nervous System Infection-This can be in the form of Meningitis, which can be deadly. 3. Epidural Infections-This can be in the form of an epidural abscess, which can cause pressure inside of the spine, causing compression of the spinal cord with subsequent paralysis. This would require an emergency surgery to decompress, and there are no guarantees that the patient would recover from the paralysis. 4. Discitis-This is an infection of the intervertebral discs.  It occurs in  about 1% of discography procedures.  It is difficult to treat and it may lead to surgery.        2. Pain: the needles have to go through skin and soft tissues, will cause soreness.       3. Damage to internal structures:  The nerves to be lesioned may be near blood vessels or    other nerves which can be potentially damaged.        4. Bleeding: Bleeding is more common if the patient is taking blood thinners such as  aspirin, Coumadin, Ticiid, Plavix, etc., or if he/she have some genetic predisposition  such as hemophilia. Bleeding into the spinal canal can cause compression of the spinal  cord with subsequent paralysis.  This would require an emergency surgery to  decompress and there are no guarantees that the patient would recover from the  paralysis.       5. Pneumothorax:  Puncturing of a lung is a possibility, every time a needle is introduced in  the area of the chest or upper back.  Pneumothorax refers to free air around the  collapsed lung(s), inside of the thoracic cavity (chest cavity).  Another two possible  complications related to a similar event would include: Hemothorax and Chylothorax.   These are variations of the Pneumothorax, where instead of air around the collapsed  lung(s), you may have blood or chyle, respectively.       6. Spinal headaches: They may occur with any procedures in the area of the spine.       7. Persistent CSF (Cerebro-Spinal Fluid) leakage: This is a rare problem, but may occur  with prolonged intrathecal or epidural catheters either due to the formation of a fistulous  track or a dural tear.       8. Nerve damage: By working so close to the spinal cord, there is always a possibility of  nerve damage, which could be as serious as a permanent spinal cord injury with  paralysis.       9. Death:  Although rare, severe deadly allergic reactions known as "Anaphylactic  reaction" can occur to any of the medications used.      10. Worsening of the symptoms:  We can always make thing worse.  What are the chances of something like this happening? Chances of any of this occuring are extremely low.  By statistics, you have more of a chance of getting killed in a motor vehicle accident: while driving to the hospital than any of the above occurring .  Nevertheless, you should be aware that they are  possibilities.  In general, it is similar to taking a shower.  Everybody knows that you can slip, hit your head and get killed.  Does that mean that you should not shower again?  Nevertheless always keep in mind that statistics do not mean anything if you happen to be on the wrong side of them.  Even if a procedure has a 1 (one) in a 1,000,000 (million) chance of going wrong, it you happen to be that one..Also, keep in mind that by statistics, you have more of a chance of having something go wrong when taking medications.  Who should not have this procedure? If you are on a blood thinning medication (e.g. Coumadin, Plavix, see list of "Blood Thinners"), or if you have an active infection going on, you should not have the procedure.  If you are taking any blood thinners, please inform your physician.  How should  I prepare for this procedure?  Do not eat or drink anything at least six hours prior to the procedure.  Bring a driver with you .  It cannot be a taxi.  Come accompanied by an adult that can drive you back, and that is strong enough to help you if your legs get weak or numb from the local anesthetic.  Take all of your medicines the morning of the procedure with just enough water to swallow them.  If you have diabetes, make sure that you are scheduled to have your procedure done first thing in the morning, whenever possible.  If you have diabetes, take only half of your insulin dose and notify our nurse that you have done so as soon as you arrive at the clinic.  If you are diabetic, but only take blood sugar pills (oral hypoglycemic), then do not take them on the morning of your procedure.  You may take them after you have had the procedure.  Do not take aspirin or any aspirin-containing medications, at least eleven (11) days prior to the procedure.  They may prolong bleeding.  Wear loose fitting clothing that may be easy to take off and that you would not mind if it got stained with  Betadine or blood.  Do not wear any jewelry or perfume  Remove any nail coloring.  It will interfere with some of our monitoring equipment.  NOTE: Remember that this is not meant to be interpreted as a complete list of all possible complications.  Unforeseen problems may occur.  BLOOD THINNERS The following drugs contain aspirin or other products, which can cause increased bleeding during surgery and should not be taken for 2 weeks prior to and 1 week after surgery.  If you should need take something for relief of minor pain, you may take acetaminophen which is found in Tylenol,m Datril, Anacin-3 and Panadol. It is not blood thinner. The products listed below are.  Do not take any of the products listed below in addition to any listed on your instruction sheet.  A.P.C or A.P.C with Codeine Codeine Phosphate Capsules #3 Ibuprofen Ridaura  ABC compound Congesprin Imuran rimadil  Advil Cope Indocin Robaxisal  Alka-Seltzer Effervescent Pain Reliever and Antacid Coricidin or Coricidin-D  Indomethacin Rufen  Alka-Seltzer plus Cold Medicine Cosprin Ketoprofen S-A-C Tablets  Anacin Analgesic Tablets or Capsules Coumadin Korlgesic Salflex  Anacin Extra Strength Analgesic tablets or capsules CP-2 Tablets Lanoril Salicylate  Anaprox Cuprimine Capsules Levenox Salocol  Anexsia-D Dalteparin Magan Salsalate  Anodynos Darvon compound Magnesium Salicylate Sine-off  Ansaid Dasin Capsules Magsal Sodium Salicylate  Anturane Depen Capsules Marnal Soma  APF Arthritis pain formula Dewitt's Pills Measurin Stanback  Argesic Dia-Gesic Meclofenamic Sulfinpyrazone  Arthritis Bayer Timed Release Aspirin Diclofenac Meclomen Sulindac  Arthritis pain formula Anacin Dicumarol Medipren Supac  Analgesic (Safety coated) Arthralgen Diffunasal Mefanamic Suprofen  Arthritis Strength Bufferin Dihydrocodeine Mepro Compound Suprol  Arthropan liquid Dopirydamole Methcarbomol with Aspirin Synalgos  ASA tablets/Enseals Disalcid  Micrainin Tagament  Ascriptin Doan's Midol Talwin  Ascriptin A/D Dolene Mobidin Tanderil  Ascriptin Extra Strength Dolobid Moblgesic Ticlid  Ascriptin with Codeine Doloprin or Doloprin with Codeine Momentum Tolectin  Asperbuf Duoprin Mono-gesic Trendar  Aspergum Duradyne Motrin or Motrin IB Triminicin  Aspirin plain, buffered or enteric coated Durasal Myochrisine Trigesic  Aspirin Suppositories Easprin Nalfon Trillsate  Aspirin with Codeine Ecotrin Regular or Extra Strength Naprosyn Uracel  Atromid-S Efficin Naproxen Ursinus  Auranofin Capsules Elmiron Neocylate Vanquish  Axotal Emagrin Norgesic Verin  Azathioprine Empirin or  Empirin with Codeine Normiflo Vitamin E  Azolid Emprazil Nuprin Voltaren  Bayer Aspirin plain, buffered or children's or timed BC Tablets or powders Encaprin Orgaran Warfarin Sodium  Buff-a-Comp Enoxaparin Orudis Zorpin  Buff-a-Comp with Codeine Equegesic Os-Cal-Gesic   Buffaprin Excedrin plain, buffered or Extra Strength Oxalid   Bufferin Arthritis Strength Feldene Oxphenbutazone   Bufferin plain or Extra Strength Feldene Capsules Oxycodone with Aspirin   Bufferin with Codeine Fenoprofen Fenoprofen Pabalate or Pabalate-SF   Buffets II Flogesic Panagesic   Buffinol plain or Extra Strength Florinal or Florinal with Codeine Panwarfarin   Buf-Tabs Flurbiprofen Penicillamine   Butalbital Compound Four-way cold tablets Penicillin   Butazolidin Fragmin Pepto-Bismol   Carbenicillin Geminisyn Percodan   Carna Arthritis Reliever Geopen Persantine   Carprofen Gold's salt Persistin   Chloramphenicol Goody's Phenylbutazone   Chloromycetin Haltrain Piroxlcam   Clmetidine heparin Plaquenil   Cllnoril Hyco-pap Ponstel   Clofibrate Hydroxy chloroquine Propoxyphen         Before stopping any of these medications, be sure to consult the physician who ordered them.  Some, such as Coumadin (Warfarin) are ordered to prevent or treat serious conditions such as "deep thrombosis",  "pumonary embolisms", and other heart problems.  The amount of time that you may need off of the medication may also vary with the medication and the reason for which you were taking it.  If you are taking any of these medications, please make sure you notify your pain physician before you undergo any procedures.         Facet Joint Block, Care After Refer to this sheet in the next few weeks. These instructions provide you with information on caring for yourself after your procedure. Your health care provider may also give you more specific instructions. Your treatment has been planned according to current medical practices, but problems sometimes occur. Call your health care provider if you have any problems or questions after your procedure. HOME CARE INSTRUCTIONS  2. Keep track of the amount of pain relief you feel and how long it lasts. 3. Limit pain medicine within the first 4-6 hours after the procedure as directed by your health care provider. 4. Resume taking dietary supplements and medicines as directed by your health care provider. 5. You may resume your regular diet. 6. Do not apply heat near or over the injection site(s) for 24 hours.  7. Do not take a bath or soak in water (such as a pool or lake) for 24 hours. 8. Do not drive for 24 hours unless approved by your health care provider. 9. Avoid strenuous activity for 24 hours. 10. Remove your bandages the morning after the procedure.  11. If the injection site is tender, applying an ice pack may relieve some tenderness. To do this: 1. Put ice in a bag. 2. Place a towel between your skin and the bag. 3. Leave the ice on for 15-20 minutes, 3-4 times a day. 12. Keep follow-up appointments as directed by your health care provider. SEEK MEDICAL CARE IF:   Your pain is not controlled by your medicines.   There is drainage from the injection site.   There is significant bleeding or swelling at the injection site.  You have  diabetes and your blood sugar is above 180 mg/dL. SEEK IMMEDIATE MEDICAL CARE IF:   You develop a fever of 101F (38.3C) or greater.   You have worsening pain or swelling around the injection site.   You have red streaking around the injection site.  You develop severe pain that is not controlled by your medicines.   You develop a headache, stiff neck, nausea, or vomiting.   Your eyes become very sensitive to light.   You have weakness, paralysis, or tingling in your arms or legs that was not present before the procedure.   You develop difficulty urinating or breathing.    This information is not intended to replace advice given to you by your health care provider. Make sure you discuss any questions you have with your health care provider.   Document Released: 09/11/2012 Document Revised: 10/16/2014 Document Reviewed: 09/11/2012 Elsevier Interactive Patient Education 2016 [employer] Facet Joint Block The facet joints connect the bones of the spine (vertebrae). They make it possible for you to bend, twist, and make other movements with your spine. They also prevent you from overbending, overtwisting, and making other excessive movements.  A facet joint block is a procedure where a numbing medicine (anesthetic) is injected into a facet joint. Often, a type of anti-inflammatory medicine called a steroid is also injected. A facet joint block may be done for two reasons:  13. Diagnosis. A facet joint block may be done as a test to see whether neck or back pain is caused by a worn-down or infected facet joint. If the pain gets better after a facet joint block, it means the pain is probably coming from the facet joint. If the pain does not get better, it means the pain is probably not coming from the facet joint.  14. Therapy. A facet joint block may be done to relieve neck or back pain caused by a facet joint. A facet joint block is only done as a therapy if the pain does not  improve with medicine, exercise programs, physical therapy, and other forms of pain management. LET [facility] CARE PROVIDER KNOW ABOUT:   Any allergies you have.   All medicines you are taking, including vitamins, herbs, eyedrops, and over-the-counter medicines and creams.   Previous problems you or members of your family have had with the use of anesthetics.   Any blood disorders you have had.   Other health problems you have. RISKS AND COMPLICATIONS Generally, having a facet joint block is safe. However, as with any procedure, complications can occur. Possible complications associated with having a facet joint block include:   Bleeding.   Injury to a nerve near the injection site.   Pain at the injection site.   Weakness or numbness in areas controlled by nerves near the injection site.   Infection.   Temporary fluid retention.   Allergic reaction to anesthetics or medicines used during the procedure. BEFORE THE PROCEDURE   Follow your health care provider's instructions if you are taking dietary supplements or medicines. You may need to stop taking them or reduce your dosage.   Do not take any new dietary supplements or medicines without asking your health care provider first.   Follow your health care provider's instructions about eating and drinking before the procedure. You may need to stop eating and drinking several hours before the procedure.   Arrange to have an adult drive you home after the procedure. PROCEDURE 12. You may need to remove your clothing and dress in an open-back gown so that your health care provider can access your spine.  13. The procedure will be done while you are lying on an X-ray table. Most of the time you will be asked to lie on your stomach, but you may be  asked to lie in a different position if an injection will be made in your neck.  14. Special machines will be used to monitor your oxygen levels, heart rate, and blood  pressure.  15. If an injection will be made in your neck, an intravenous (IV) tube will be inserted into one of your veins. Fluids and medicine will flow directly into your body through the IV tube.  16. The area over the facet joint where the injection will be made will be cleaned with an antiseptic soap. The surrounding skin will be covered with sterile drapes.  17. An anesthetic will be applied to your skin to make the injection area numb. You may feel a temporary stinging or burning sensation.  18. A video X-ray machine will be used to locate the joint. A contrast dye may be injected into the facet joint area to help with locating the joint.  19. When the joint is located, an anesthetic medicine will be injected into the joint through the needle.  76. Your health care provider will ask you whether you feel pain relief. If you do feel relief, a steroid may be injected to provide pain relief for a longer period of time. If you do not feel relief or feel only partial relief, additional injections of an anesthetic may be made in other facet joints.  21. The needle will be removed, the skin will be cleansed, and bandages will be applied.  AFTER THE PROCEDURE   You will be observed for 15-30 minutes before being allowed to go home. Do not drive. Have an adult drive you or take a taxi or public transportation instead.   If you feel pain relief, the pain will return in several hours or days when the anesthetic wears off.   You may feel pain relief 2-14 days after the procedure. The amount of time this relief lasts varies from person to person.   It is normal to feel some tenderness over the injected area(s) for 2 days following the procedure.   If you have diabetes, you may have a temporary increase in blood sugar.   This information is not intended to replace advice given to you by your health care provider. Make sure you discuss any questions you have with your health care provider.    Document Released: 02/14/2007 Document Revised: 10/16/2014 Document Reviewed: 07/15/2012 Elsevier Interactive Patient Education [employer].

## 2016-02-10 NOTE — Progress Notes (Signed)
Subjective:    Patient ID: Leanord Asal, female    DOB: April 09, 1971, 45 y.o.   MRN: [id number]  HPI  The patient is a 45 year old female who comes to pain management at the request of primary care physician Despina Pole for further evaluation and treatment of pain involving the lumbar and lower extremity regions predominantly. The patient has history of degenerative changes of the lumbar spine and has undergone prior IDET procedure in 2000 with history of L4-L5 annular tear noted on MRI. The patient has been prescribed medications consisting of Lyrica and baclofen and Hysingla ER. The patient continues Lyrica and baclofen at this time. We discussed patient's condition and patient stated that her pain radiating from the lumbar region toward the lower extremities left greater than the right and described the pain is agonizing burning constant deep sharp shooting stabbing tingling tiring sensation that awakens patient from sleep and interfered the patient ability to go to sleep. The patient stated the pain was also associated with numbness of the lower extremity. The patient stated the pain increased with bending climbing intercourse kneeling lifting motion nerve blocks sitting standing squatting stooping twisting walking working. Patient stated the pain had decreased with cold packs and hot packs resting warm showers and baths as well as with physical therapy and medications. We discussed patient's condition and reviewed prior MRI revealed patient to be with L1-L2 to L5-S1 facet spondylosis , moderate facet arthropathy L4-L5 and displacement with potential for compression of the L5 nerve root     Review of Systems    Cardiovascular: Unremarkable   Pulmonary: Asthma Positive cigarette smoking  Neurological: Unremarkable  Psychological: ADHD  Gastrointestinal: Gastroesophageal reflux  disease  Genitourinary: Unremarkable  Hematologic: Unremarkable  Endocrine: Unremarkable  Rheumatological: Osteoarthritis  Musculoskeletal: Unremarkable  Other significant: Unremarkable       Objective:   Physical Exam  The patient was with tenderness to palpation of the splenius capitis and occipitalis musculature regions of mild to moderate degree with mild to moderate tenderness of the cervical facet cervical paraspinal musculature region. Palpation of the acromioclavicular and glenohumeral joint regions were tenderness to palpation of mild to moderate degree and patient appeared to be with unremarkable Spurling's maneuver.. The patient appeared to be with bilaterally equal grip strength without significant increase of pain with Tinel and Phalen's maneuver. Palpation over the thoracic region thoracic facet region was attends to palpation with no crepitus of the thoracic region noted. There appeared to be muscle spasm of moderate degree of the lower thoracic paraspinal musculature region. Palpation over the lumbar paraspinal musculatures and lumbar facet region was associated with increased pain with lateral bending rotation extension and palpation of the lumbar facets reproducing moderate discomfort... There was tenderness to palpation of the PSIS and PII S region a moderate degree with moderate tenderness of the gluteal and piriformis musculature region. Straight leg raise was tolerates approximately 30 without a definite increased pain with dorsiflexion noted. EHL strength appeared to be slightly decreased. The knees were without excessive tenderness to palpation with negative anterior and posterior drawer signs without ballottement of the patella. No definite sensory deficit or dermatomal distribution of the lower extremities noted. There was negative clonus negative Homans. Abdomen was nontender with no costovertebral angle tenderness noted.      Assessment & Plan:    Degenerative disc disease lumbar spine L1-2 to L5-S1 spondylosis with L4-5 moderate facet arthropathy With displacement and potential for compression of the With displacement and potential for compression of the L5  nerve root  Lumbar facet syndrome  Lumbar radiculopathy  Sacroiliac joint dysfunction  Cervicalgia      PLAN  Continue present medication  Lumbar facet, medial branch nerve blocks to be performed at time of return appointment  F/U PCP K Clark for evaliation of  BP and general medical  condition  F/U surgical evaluation. May consider pending follow-up evaluations and  pending results of lumbar MRI  Ask the secretary and nurses the date of your lumbar MRI  F/U neurological evaluation. May consider PNCV/EMG studies and other studies pending follow-up evaluations  May consider radiofrequency rhizolysis or intraspinal procedures pending response to present treatment and F/U evaluation  Patient to call [facility] should patient have concerns prior to scheduled return appointment.

## 2016-02-14 ENCOUNTER — Other Ambulatory Visit: Payer: Self-pay

## 2016-02-14 DIAGNOSIS — M5441 Lumbago with sciatica, right side: Secondary | ICD-10-CM

## 2016-02-14 DIAGNOSIS — M5442 Lumbago with sciatica, left side: Secondary | ICD-10-CM

## 2016-02-14 DIAGNOSIS — G8929 Other chronic pain: Secondary | ICD-10-CM

## 2016-02-14 DIAGNOSIS — F909 Attention-deficit hyperactivity disorder, unspecified type: Secondary | ICD-10-CM

## 2016-02-14 NOTE — Telephone Encounter (Signed)
Who is waiting on permission from who? In regards to her lyrica do you mean?   Sorry-I'm not understanding

## 2016-02-14 NOTE — Telephone Encounter (Signed)
Pt left v/m requesting refill lyrica(last refilled # 60 on 01/17/16) to [facility]; adderall XR 20 mg (last printed # 60 on 11/25/15) Pt going to [facility]` pain clinic and they are waiting on permission to treat and wants to know what to do about pain meds. Pt request cb. Allie Bossier NP out of office.pt established 11/25/15.

## 2016-02-15 ENCOUNTER — Telehealth: Payer: Self-pay | Admitting: Primary Care

## 2016-02-15 MED ORDER — PREGABALIN 150 MG PO CAPS: 150.0000 mg | ORAL_CAPSULE | Freq: Two times a day (BID) | ORAL | Status: DC

## 2016-02-15 MED ORDER — AMPHETAMINE-DEXTROAMPHET ER 20 MG PO CP24: 40.0000 mg | ORAL_CAPSULE | Freq: Every day | ORAL | Status: DC

## 2016-02-15 NOTE — Telephone Encounter (Signed)
Done Printed the adderall  Please call in lyrica Will cc Anda Kraft for when she returns

## 2016-02-15 NOTE — Telephone Encounter (Signed)
Rx for Lyrica called in as prescribed, pt advise Rx sent and advise adderall Rx ready for pick up

## 2016-02-15 NOTE — Telephone Encounter (Signed)
Spoke with pt and she advise me that the pain clinic said they can't start prescribing meds until they get permission from Anda Kraft and Anda Kraft advises them she isn't filling med anymore(Kate out of office). Pt said they have not filled any meds yet and advised her to check with [place] about refills until they take over meds. Pt wants refill of Lyrica sent to pharmacy because she will be out on 02/17/16,and refill of adderall, pt knows adderall is a little early but Anda Kraft usually just puts not to refill it until it's due (this refill has don't fill until 02/22/16 on it), pt request refill of med, please advise

## 2016-02-15 NOTE — Telephone Encounter (Signed)
Please clarify with patient and [facility] pain clinic: Are they treating her chronic pain? They have my full permission to treat her pain as that is why I referred her to their services.

## 2016-02-16 NOTE — Telephone Encounter (Signed)
Received form from [facility] which was signed by Webb Silversmith NP and faxed back.

## 2016-02-16 NOTE — Telephone Encounter (Signed)
Spoke to patient and was advised that she saw Dr. Primus Bravo at the [facility] 02/10/16. Patient stated that they are waiting to hear back from [place] giving them permission  to treat her. Called and spoke to [place] at the Pain Clinic and was advised that they faxed over to the office a form for Beverly Wright to sign and send back giving permission to treat patient. Was advised that they can not take verbal permission and the form has to be signed and sent back to them. Beverly Wright stated that she will fax the form over again for Beverly Wright signature.

## 2016-02-17 LAB — TOXASSURE SELECT 13 (MW), URINE

## 2016-02-18 ENCOUNTER — Other Ambulatory Visit: Payer: Self-pay | Admitting: Primary Care

## 2016-02-18 DIAGNOSIS — Z0000 Encounter for general adult medical examination without abnormal findings: Secondary | ICD-10-CM

## 2016-02-21 ENCOUNTER — Encounter: Payer: Self-pay | Admitting: Pain Medicine

## 2016-02-21 ENCOUNTER — Ambulatory Visit: Payer: BLUE CROSS/BLUE SHIELD | Attending: Pain Medicine | Admitting: Pain Medicine

## 2016-02-21 VITALS — BP 112/71 | HR 82 | Temp 97.7°F | Resp 13 | Ht 67.0 in | Wt 215.0 lb

## 2016-02-21 DIAGNOSIS — M5136 Other intervertebral disc degeneration, lumbar region: Secondary | ICD-10-CM | POA: Diagnosis not present

## 2016-02-21 DIAGNOSIS — M5416 Radiculopathy, lumbar region: Secondary | ICD-10-CM

## 2016-02-21 DIAGNOSIS — M533 Sacrococcygeal disorders, not elsewhere classified: Secondary | ICD-10-CM

## 2016-02-21 DIAGNOSIS — M1288 Other specific arthropathies, not elsewhere classified, other specified site: Secondary | ICD-10-CM | POA: Diagnosis not present

## 2016-02-21 DIAGNOSIS — M79606 Pain in leg, unspecified: Secondary | ICD-10-CM | POA: Diagnosis present

## 2016-02-21 DIAGNOSIS — M47816 Spondylosis without myelopathy or radiculopathy, lumbar region: Secondary | ICD-10-CM | POA: Diagnosis not present

## 2016-02-21 DIAGNOSIS — M545 Low back pain: Secondary | ICD-10-CM | POA: Diagnosis present

## 2016-02-21 MED ORDER — CEFAZOLIN SODIUM 1 G IJ SOLR
INTRAMUSCULAR | Status: AC
  Administered 2016-02-21: 11:00:00
  Filled 2016-02-21: qty 10

## 2016-02-21 MED ORDER — TRIAMCINOLONE ACETONIDE 40 MG/ML IJ SUSP
40.0000 mg | Freq: Once | INTRAMUSCULAR | Status: DC
  Filled 2016-02-21: qty 1

## 2016-02-21 MED ORDER — CEFAZOLIN SODIUM 1-5 GM-% IV SOLN
1.0000 g | Freq: Once | INTRAVENOUS | Status: AC
  Administered 2016-02-21: 1 g via INTRAVENOUS

## 2016-02-21 MED ORDER — LACTATED RINGERS IV SOLN: 1000.0000 mL | INTRAVENOUS | Status: DC

## 2016-02-21 MED ORDER — CEFUROXIME AXETIL 250 MG PO TABS: 250.0000 mg | ORAL_TABLET | Freq: Two times a day (BID) | ORAL | Status: DC

## 2016-02-21 MED ORDER — FENTANYL CITRATE (PF) 100 MCG/2ML IJ SOLN
100.0000 ug | Freq: Once | INTRAMUSCULAR | Status: AC
  Administered 2016-02-21: 100 ug via INTRAVENOUS
  Filled 2016-02-21: qty 2

## 2016-02-21 MED ORDER — MIDAZOLAM HCL 5 MG/5ML IJ SOLN
5.0000 mg | Freq: Once | INTRAMUSCULAR | Status: AC
  Administered 2016-02-21: 5 mg via INTRAVENOUS
  Filled 2016-02-21: qty 5

## 2016-02-21 MED ORDER — ORPHENADRINE CITRATE 30 MG/ML IJ SOLN
60.0000 mg | Freq: Once | INTRAMUSCULAR | Status: DC
  Filled 2016-02-21: qty 2

## 2016-02-21 MED ORDER — BUPIVACAINE HCL (PF) 0.25 % IJ SOLN
30.0000 mL | Freq: Once | INTRAMUSCULAR | Status: DC
  Filled 2016-02-21: qty 30

## 2016-02-21 NOTE — Patient Instructions (Addendum)
PLAN  Continue present medication Lyrica for now Please obtain Ceftin antibiotic and begin taking Ceftin antibiotic today as prescribed You have inquired about Cymbalta. Cymbalta may be medication that will benefit you. You'll have concern regarding weight gain from Lyrica. Topamax may be an alternative medication to replace Lyrica. Please discuss these medications with primary care physician Gentry Fitz  F/U PCP Gentry Fitz for evaliation of  BP and general medical  condition  F/U surgical evaluation. May consider pending follow-up evaluations and  pending results of lumbar MRI  Ask the secretary and nurses the date of your lumbar MRI  F/U neurological evaluation. May consider PNCV/EMG studies and other studies pending follow-up evaluations  May consider radiofrequency rhizolysis or intraspinal procedures pending response to present treatment and F/U evaluation  Patient to call [facility] should patient have concerns prior to scheduled return appointment. GENERAL RISKS AND COMPLICATIONS  What are the risk, side effects and possible complications? Generally speaking, most procedures are safe.  However, with any procedure there are risks, side effects, and the possibility of complications.  The risks and complications are dependent upon the sites that are lesioned, or the type of nerve block to be performed.  The closer the procedure is to the spine, the more serious the risks are.  Great care is taken when placing the radio frequency needles, block needles or lesioning probes, but sometimes complications can occur. 1. Infection: Any time there is an injection through the skin, there is a risk of infection.  This is why sterile conditions are used for these blocks.  There are four possible types of infection. 1. Localized skin infection. 2. Central Nervous System Infection-This can be in the form of Meningitis, which can be deadly. 3. Epidural Infections-This can be in the form of an  epidural abscess, which can cause pressure inside of the spine, causing compression of the spinal cord with subsequent paralysis. This would require an emergency surgery to decompress, and there are no guarantees that the patient would recover from the paralysis. 4. Discitis-This is an infection of the intervertebral discs.  It occurs in about 1% of discography procedures.  It is difficult to treat and it may lead to surgery.        2. Pain: the needles have to go through skin and soft tissues, will cause soreness.       3. Damage to internal structures:  The nerves to be lesioned may be near blood vessels or    other nerves which can be potentially damaged.       4. Bleeding: Bleeding is more common if the patient is taking blood thinners such as  aspirin, Coumadin, Ticiid, Plavix, etc., or if he/she have some genetic predisposition  such as hemophilia. Bleeding into the spinal canal can cause compression of the spinal  cord with subsequent paralysis.  This would require an emergency surgery to  decompress and there are no guarantees that the patient would recover from the  paralysis.       5. Pneumothorax:  Puncturing of a lung is a possibility, every time a needle is introduced in  the area of the chest or upper back.  Pneumothorax refers to free air around the  collapsed lung(s), inside of the thoracic cavity (chest cavity).  Another two possible  complications related to a similar event would include: Hemothorax and Chylothorax.   These are variations of the Pneumothorax, where instead of air around the collapsed  lung(s), you may have blood or  chyle, respectively.       6. Spinal headaches: They may occur with any procedures in the area of the spine.       7. Persistent CSF (Cerebro-Spinal Fluid) leakage: This is a rare problem, but may occur  with prolonged intrathecal or epidural catheters either due to the formation of a fistulous  track or a dural tear.       8. Nerve damage: By working so close  to the spinal cord, there is always a possibility of  nerve damage, which could be as serious as a permanent spinal cord injury with  paralysis.       9. Death:  Although rare, severe deadly allergic reactions known as "Anaphylactic  reaction" can occur to any of the medications used.      10. Worsening of the symptoms:  We can always make thing worse.  What are the chances of something like this happening? Chances of any of this occuring are extremely low.  By statistics, you have more of a chance of getting killed in a motor vehicle accident: while driving to the hospital than any of the above occurring .  Nevertheless, you should be aware that they are possibilities.  In general, it is similar to taking a shower.  Everybody knows that you can slip, hit your head and get killed.  Does that mean that you should not shower again?  Nevertheless always keep in mind that statistics do not mean anything if you happen to be on the wrong side of them.  Even if a procedure has a 1 (one) in a 1,000,000 (million) chance of going wrong, it you happen to be that one..Also, keep in mind that by statistics, you have more of a chance of having something go wrong when taking medications.  Who should not have this procedure? If you are on a blood thinning medication (e.g. Coumadin, Plavix, see list of "Blood Thinners"), or if you have an active infection going on, you should not have the procedure.  If you are taking any blood thinners, please inform your physician.  How should I prepare for this procedure?  Do not eat or drink anything at least six hours prior to the procedure.  Bring a driver with you .  It cannot be a taxi.  Come accompanied by an adult that can drive you back, and that is strong enough to help you if your legs get weak or numb from the local anesthetic.  Take all of your medicines the morning of the procedure with just enough water to swallow them.  If you have diabetes, make sure that you  are scheduled to have your procedure done first thing in the morning, whenever possible.  If you have diabetes, take only half of your insulin dose and notify our nurse that you have done so as soon as you arrive at the clinic.  If you are diabetic, but only take blood sugar pills (oral hypoglycemic), then do not take them on the morning of your procedure.  You may take them after you have had the procedure.  Do not take aspirin or any aspirin-containing medications, at least eleven (11) days prior to the procedure.  They may prolong bleeding.  Wear loose fitting clothing that may be easy to take off and that you would not mind if it got stained with Betadine or blood.  Do not wear any jewelry or perfume  Remove any nail coloring.  It will interfere with some of our  monitoring equipment.  NOTE: Remember that this is not meant to be interpreted as a complete list of all possible complications.  Unforeseen problems may occur.  BLOOD THINNERS The following drugs contain aspirin or other products, which can cause increased bleeding during surgery and should not be taken for 2 weeks prior to and 1 week after surgery.  If you should need take something for relief of minor pain, you may take acetaminophen which is found in Tylenol,m Datril, Anacin-3 and Panadol. It is not blood thinner. The products listed below are.  Do not take any of the products listed below in addition to any listed on your instruction sheet.  A.P.C or A.P.C with Codeine Codeine Phosphate Capsules #3 Ibuprofen Ridaura  ABC compound Congesprin Imuran rimadil  Advil Cope Indocin Robaxisal  Alka-Seltzer Effervescent Pain Reliever and Antacid Coricidin or Coricidin-D  Indomethacin Rufen  Alka-Seltzer plus Cold Medicine Cosprin Ketoprofen S-A-C Tablets  Anacin Analgesic Tablets or Capsules Coumadin Korlgesic Salflex  Anacin Extra Strength Analgesic tablets or capsules CP-2 Tablets Lanoril Salicylate  Anaprox Cuprimine Capsules  Levenox Salocol  Anexsia-D Dalteparin Magan Salsalate  Anodynos Darvon compound Magnesium Salicylate Sine-off  Ansaid Dasin Capsules Magsal Sodium Salicylate  Anturane Depen Capsules Marnal Soma  APF Arthritis pain formula Dewitt's Pills Measurin Stanback  Argesic Dia-Gesic Meclofenamic Sulfinpyrazone  Arthritis Bayer Timed Release Aspirin Diclofenac Meclomen Sulindac  Arthritis pain formula Anacin Dicumarol Medipren Supac  Analgesic (Safety coated) Arthralgen Diffunasal Mefanamic Suprofen  Arthritis Strength Bufferin Dihydrocodeine Mepro Compound Suprol  Arthropan liquid Dopirydamole Methcarbomol with Aspirin Synalgos  ASA tablets/Enseals Disalcid Micrainin Tagament  Ascriptin Doan's Midol Talwin  Ascriptin A/D Dolene Mobidin Tanderil  Ascriptin Extra Strength Dolobid Moblgesic Ticlid  Ascriptin with Codeine Doloprin or Doloprin with Codeine Momentum Tolectin  Asperbuf Duoprin Mono-gesic Trendar  Aspergum Duradyne Motrin or Motrin IB Triminicin  Aspirin plain, buffered or enteric coated Durasal Myochrisine Trigesic  Aspirin Suppositories Easprin Nalfon Trillsate  Aspirin with Codeine Ecotrin Regular or Extra Strength Naprosyn Uracel  Atromid-S Efficin Naproxen Ursinus  Auranofin Capsules Elmiron Neocylate Vanquish  Axotal Emagrin Norgesic Verin  Azathioprine Empirin or Empirin with Codeine Normiflo Vitamin E  Azolid Emprazil Nuprin Voltaren  Bayer Aspirin plain, buffered or children's or timed BC Tablets or powders Encaprin Orgaran Warfarin Sodium  Buff-a-Comp Enoxaparin Orudis Zorpin  Buff-a-Comp with Codeine Equegesic Os-Cal-Gesic   Buffaprin Excedrin plain, buffered or Extra Strength Oxalid   Bufferin Arthritis Strength Feldene Oxphenbutazone   Bufferin plain or Extra Strength Feldene Capsules Oxycodone with Aspirin   Bufferin with Codeine Fenoprofen Fenoprofen Pabalate or Pabalate-SF   Buffets II Flogesic Panagesic   Buffinol plain or Extra Strength Florinal or Florinal with  Codeine Panwarfarin   Buf-Tabs Flurbiprofen Penicillamine   Butalbital Compound Four-way cold tablets Penicillin   Butazolidin Fragmin Pepto-Bismol   Carbenicillin Geminisyn Percodan   Carna Arthritis Reliever Geopen Persantine   Carprofen Gold's salt Persistin   Chloramphenicol Goody's Phenylbutazone   Chloromycetin Haltrain Piroxlcam   Clmetidine heparin Plaquenil   Cllnoril Hyco-pap Ponstel   Clofibrate Hydroxy chloroquine Propoxyphen         Before stopping any of these medications, be sure to consult the physician who ordered them.  Some, such as Coumadin (Warfarin) are ordered to prevent or treat serious conditions such as "deep thrombosis", "pumonary embolisms", and other heart problems.  The amount of time that you may need off of the medication may also vary with the medication and the reason for which you were taking it.  If you are  taking any of these medications, please make sure you notify your pain physician before you undergo any procedures.

## 2016-02-21 NOTE — Progress Notes (Signed)
Patient here for procedure d/t lower back pain.  Patient would like you to take over prescribing medications and permission has been received by [facility] office.  Patient would like to switch Lyrica 150 mg to Toprol d/t the fact that she is trying to lose weight.  Safety precautions to be maintained throughout the outpatient stay will include: orient to surroundings, keep bed in low position, maintain call bell within reach at all times, provide assistance with transfer out of bed and ambulation.

## 2016-02-21 NOTE — Progress Notes (Signed)
Subjective:    Patient ID: Leanord Asal, female    DOB: 1971/01/14, 45 y.o.   MRN: [id number]  HPI  PROCEDURE PERFORMED: Lumbar facet (medial branch block)   NOTE: The patient is a 45 y.o. female who returns to [place] for further evaluation and treatment of pain involving the lumbar and lower extremity region. MRI  revealed the patient to be with evidence of degenerative disc disease lumbar spine with L1-2 to L5-S1 spondylosis with L4-5 moderate facet arthropathy With displacement and potential for compression of the With displacement and potential for compression of the L5 nerve root. There is concern regarding intraspinal abnormalities contributing to patient's symptomatology with concern regarding component of pain due to facet arthrosis lumbar radiculopathy and lumbar stenosis with neurogenic claudication at the present time decision has been made to proceed with lumbar facet, medial branch nerve blocks in attempt to decrease severity of patient's symptoms, minimize progression of symptoms, and avoid the need for more involved treatment. The risks, benefits, and expectations of the procedure have been discussed and explained to the patient who was understanding and in agreement with suggested treatment plan. We will proceed with interventional treatment as discussed and as explained to the patient who was understanding and wished to proceed with procedure as planned.   DESCRIPTION OF PROCEDURE: Lumbar facet (medial branch block) with IV Versed, IV fentanyl conscious sedation, EKG, blood pressure, pulse, capnography, and pulse oximetry monitoring. The procedure was performed with the patient in the prone position. Betadine prep of proposed entry site performed.   NEEDLE PLACEMENT AT: Left L 2 lumbar facet (medial branch block). Under fluoroscopic guidance with oblique orientation of 15 degrees, a 22-gauge needle was inserted at the L 2 vertebral body level with needle placed at  the targeted area of Burton's Eye or Eye of the Scotty Dog with documentation of needle placement in the superior and lateral border of targeted area of Burton's Eye or Eye of the Scotty Dog with oblique orientation of 15 degrees. Following documentation of needle placement at the L 2 vertebral body level, needle placement was then accomplished at the L 3 vertebral body level.   NEEDLE PLACEMENT AT L3, L4, and L5 VERTEBRAL BODY LEVELS ON THE LEFT SIDE The procedure was performed at the L3, L4, and L5 vertebral body levels exactly as was performed at the L 2 vertebral body level utilizing the same technique and under fluoroscopic guidance.  NEEDLE PLACEMENT AT THE SACRAL ALA with AP view of the lumbosacral spine. With the patient in the prone position, Betadine prep of proposed entry site accomplished, a 22 gauge needle was inserted in the region of the sacral ala (groove formed by the superior articulating process of S1 and the sacral wing). Following documentation of needle placement at the sacral ala,    Needle placement was then verified at all levels on lateral view. Following documentation of needle placement at all levels on lateral view and following negative aspiration for heme and CSF, each level was injected with 1 mL of 0.25% bupivacaine with Kenalog.     LUMBAR FACET, MEDIAL BRANCH NERVE, BLOCKS PERFORMED ON THE RIGHT SIDE   The procedure was performed on the right side exactly as was performed on the left side at the same levels and utilizing the same technique under fluoroscopic guidance.     The patient tolerated the procedure well. A total of 40 mg of Kenalog was utilized for the procedure.   PLAN:  1. Medications: The patient  will continue presently prescribed medications. 2. May consider modification of treatment regimen at time of return appointment pending response to treatment rendered on today's visit. 3. The patient is to follow-up with primary care physician Gentry Fitz  for further evaluation of blood pressure and general medical condition status post steroid injection performed on today's visit. 4. Surgical follow-up evaluation. Has been addressed 5. Neurological follow-up evaluation.. May consider PNCV EMG studies and other studies 6. The patient may be candidate for radiofrequency procedures, implantation type procedures, and other treatment pending response to treatment and follow-up evaluation. 7. The patient has been advised to call the [facility] prior to scheduled return appointment should there be significant change in condition or should patient have other concerns regarding condition prior to scheduled return appointment.  The patient is understanding and in agreement with suggested treatment plan.   Review of Systems     Objective:   Physical Exam        Assessment & Plan:

## 2016-02-22 ENCOUNTER — Telehealth: Payer: Self-pay

## 2016-02-22 ENCOUNTER — Telehealth: Payer: Self-pay | Admitting: *Deleted

## 2016-02-22 ENCOUNTER — Encounter: Payer: Self-pay | Admitting: Primary Care

## 2016-02-22 ENCOUNTER — Telehealth: Payer: Self-pay | Admitting: Primary Care

## 2016-02-22 NOTE — Telephone Encounter (Signed)
Please notify Ms. Umphlett that if she desires proper management of her pain, she will need to adhere to the regimen prescribed by Dr. Vicie Mutters office. I support Dr. Ethel Rana recommendations and strongly encourage her to follow up as scheduled in June. I will not be prescribing any narcotics for her pain.

## 2016-02-22 NOTE — Telephone Encounter (Signed)
Spoken to patient as she called again regarding pain medication. Notified patient of Kate's comments. Tried to get patient understand that why Anda Kraft is not prescribing narcotics for her pain. Also suggested to patient if pain is so bad, patient may go to the ED. Patient decline and stated that she cannot afford to go. Patient started to cry and kept crying then hang up the phone.

## 2016-02-22 NOTE — Telephone Encounter (Signed)
Patient called Dr. Anell Barr office asking for pain medicine. Dr. Carlis Abbott wants to know why she is not getting it here. Please let them know today.

## 2016-02-22 NOTE — Telephone Encounter (Signed)
What's the status with pain management? Doesn't seem likely that pain management would refuse to treat. Looks like we faxed over something last week.

## 2016-02-22 NOTE — Telephone Encounter (Addendum)
Beverly Wright with Dr Primus Bravo office left v/m; Dr Primus Bravo is not going to write pain med at this time and Dr Primus Bravo is not sure when will take over prescribing pain medication.

## 2016-02-22 NOTE — Telephone Encounter (Signed)
Left voicemail re; pain medication and pain management for Beverly Wright .  At this time Dr Primus Bravo is not going to prescribe medications for the patient. If he begins prescribing, then all medication management re; pain will be at his discretion.  Patient asked for me to please call and let your office know this information.

## 2016-02-22 NOTE — Telephone Encounter (Signed)
Nurses Please discussed with me As you are well aware there is no guarantee that we will ever prescribe  medications  for treatment of patient's pain Please discuss with me in detail before you call patient

## 2016-02-22 NOTE — Telephone Encounter (Signed)
Spoken to patient. She is currently taking Lyrica but the back pain have increasing getting worse and requesting medication for the back pain. The shot that was given to patient on 02/21/16 did not help but she feels worse. She does not know that else she needs to do. She stated that she needs something for the pain until evaluation period with pain management. Patient have appointment to see Dr Primus Bravo for eval on 03/15/2016.
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
## 2016-02-22 NOTE — Telephone Encounter (Signed)
Spoke with patient re; procedure on yesterday, patient c/o the same pain and desires pain medication.  Dr Primus Bravo at this time is not going to prescribe and patient informed of this.  She states she did call PCP, Alma Friendly for medication.  Patient asked if I would please call and let them know Dr Primus Bravo decision to not prescribe medication.  Voicemail left with Rena reflecting this information.

## 2016-02-22 NOTE — Telephone Encounter (Addendum)
Pt said that she just got a shot for back pain on 02/21/16; pt is still going thru evaluation period and pain mgt cannot prescribe oral medication at this time. Pt is to contact PCP; recommendations per pain mgt are Cymbalta or topamax to substitute for Lyrica at pts request; pt is presently taking lyrica and baclofen. Pt requesting pain med for back pain. [facility]. Pt request cb ASAP.

## 2016-02-22 NOTE — Telephone Encounter (Signed)
Just received fax from pain management requesting approval to treat Beverly Wright. It appears this was signed and faxed in my absence last week.  Please call pain management and patient to figure out what's going on. I'm getting conflicting reports. Is pain management treating her?

## 2016-02-24 ENCOUNTER — Other Ambulatory Visit: Payer: Self-pay | Admitting: Primary Care

## 2016-02-24 ENCOUNTER — Telehealth: Payer: Self-pay | Admitting: Primary Care

## 2016-02-24 DIAGNOSIS — M549 Dorsalgia, unspecified: Principal | ICD-10-CM

## 2016-02-24 DIAGNOSIS — G8929 Other chronic pain: Secondary | ICD-10-CM

## 2016-02-24 NOTE — Telephone Encounter (Signed)
Please call in Tramadol 50 mg tablets. Take 1 tablet by mouth every 8 hours as needed for severe pain. #30, no refills.

## 2016-02-25 ENCOUNTER — Other Ambulatory Visit (INDEPENDENT_AMBULATORY_CARE_PROVIDER_SITE_OTHER): Payer: BLUE CROSS/BLUE SHIELD

## 2016-02-25 DIAGNOSIS — Z0000 Encounter for general adult medical examination without abnormal findings: Secondary | ICD-10-CM | POA: Diagnosis not present

## 2016-02-25 LAB — COMPREHENSIVE METABOLIC PANEL
ALBUMIN: 4.2 g/dL (ref 3.5–5.2)
ALT: 21 U/L (ref 0–35)
AST: 18 U/L (ref 0–37)
Alkaline Phosphatase: 61 U/L (ref 39–117)
BILIRUBIN TOTAL: 0.4 mg/dL (ref 0.2–1.2)
BUN: 19 mg/dL (ref 6–23)
CALCIUM: 9.3 mg/dL (ref 8.4–10.5)
CO2: 27 mEq/L (ref 19–32)
CREATININE: 0.77 mg/dL (ref 0.40–1.20)
Chloride: 104 mEq/L (ref 96–112)
GFR: 86.18 mL/min (ref >60.00)
Glucose, Bld: 114 mg/dL — ABNORMAL HIGH (ref 70–99)
Potassium: 4 mEq/L (ref 3.5–5.1)
Sodium: 138 mEq/L (ref 135–145)
Total Protein: 6.5 g/dL (ref 6.0–8.3)

## 2016-02-25 LAB — CBC
HCT: 43.6 % (ref 36.0–46.0)
HEMOGLOBIN: 14.6 g/dL (ref 12.0–15.0)
MCHC: 33.6 g/dL (ref 30.0–36.0)
MCV: 89.7 fl (ref 78.0–100.0)
PLATELETS: 303 10*3/uL (ref 150.0–400.0)
RBC: 4.86 Mil/uL (ref 3.87–5.11)
RDW: 15.2 % (ref 11.5–15.5)
WBC: 13.7 10*3/uL — ABNORMAL HIGH (ref 4.0–10.5)

## 2016-02-25 LAB — LIPID PANEL
CHOLESTEROL: 192 mg/dL (ref 0–200)
HDL: 59.2 mg/dL (ref >39.00)
LDL Cholesterol: 121 mg/dL — ABNORMAL HIGH (ref 0–99)
NonHDL: 132.33
Total CHOL/HDL Ratio: 3
Triglycerides: 56 mg/dL (ref 0.0–149.0)
VLDL: 11.2 mg/dL (ref 0.0–40.0)

## 2016-02-25 LAB — VITAMIN D 25 HYDROXY (VIT D DEFICIENCY, FRACTURES): VITD: 22.99 ng/mL — AB (ref 30.00–100.00)

## 2016-02-25 LAB — HEMOGLOBIN A1C: HEMOGLOBIN A1C: 6 % (ref 4.6–6.5)

## 2016-02-25 MED ORDER — TRAMADOL HCL 50 MG PO TABS: 50.0000 mg | ORAL_TABLET | Freq: Three times a day (TID) | ORAL | Status: DC | PRN

## 2016-02-25 NOTE — Telephone Encounter (Signed)
[facility]

## 2016-03-01 ENCOUNTER — Ambulatory Visit (INDEPENDENT_AMBULATORY_CARE_PROVIDER_SITE_OTHER): Payer: BLUE CROSS/BLUE SHIELD | Admitting: Primary Care

## 2016-03-01 VITALS — BP 124/86 | HR 108 | Temp 98.1°F | Ht 67.0 in | Wt 224.4 lb

## 2016-03-01 DIAGNOSIS — M5442 Lumbago with sciatica, left side: Secondary | ICD-10-CM | POA: Diagnosis not present

## 2016-03-01 DIAGNOSIS — F902 Attention-deficit hyperactivity disorder, combined type: Secondary | ICD-10-CM

## 2016-03-01 DIAGNOSIS — Z1239 Encounter for other screening for malignant neoplasm of breast: Secondary | ICD-10-CM

## 2016-03-01 DIAGNOSIS — M5441 Lumbago with sciatica, right side: Secondary | ICD-10-CM

## 2016-03-01 DIAGNOSIS — Z0000 Encounter for general adult medical examination without abnormal findings: Secondary | ICD-10-CM | POA: Insufficient documentation

## 2016-03-01 DIAGNOSIS — G8929 Other chronic pain: Secondary | ICD-10-CM

## 2016-03-01 MED ORDER — BACLOFEN 10 MG PO TABS: 10.0000 mg | ORAL_TABLET | Freq: Two times a day (BID) | ORAL | Status: DC

## 2016-03-01 NOTE — Patient Instructions (Addendum)
Your labs show that you are prediabetic. You must improve your diet by reducing intake of:  Breads, pasta, rice, processed carbohydrates, sugary foods, sweets, coke, gatorade.  Ensure you are consuming 64 ounces of water daily.  You will be contacted regarding your Mammogram.  Please let us know if you have not heard back within one week.   Increase Tramadol to 100 mg four times daily as needed for pain. Please e-mail me with an update.  Schedule a lab only appointment in 6 months to recheck your sugar levels.  Follow up in 1 year for repeat physical or sooner if needed.  It was a pleasure to see you today!  Diabetes Mellitus and Food It is important for you to manage your blood sugar (glucose) level. Your blood glucose level can be greatly affected by what you eat. Eating healthier foods in the appropriate amounts throughout the day at about the same time each day will help you control your blood glucose level. It can also help slow or prevent worsening of your diabetes mellitus. Healthy eating may even help you improve the level of your blood pressure and reach or maintain a healthy weight.  General recommendations for healthful eating and cooking habits include:  Eating meals and snacks regularly. Avoid going long periods of time without eating to lose weight.  Eating a diet that consists mainly of plant-based foods, such as fruits, vegetables, nuts, legumes, and whole grains.  Using low-heat cooking methods, such as baking, instead of high-heat cooking methods, such as deep frying. Work with your dietitian to make sure you understand how to use the Nutrition Facts information on food labels. HOW CAN FOOD AFFECT ME? Carbohydrates Carbohydrates affect your blood glucose level more than any other type of food. Your dietitian will help you determine how many carbohydrates to eat at each meal and teach you how to count carbohydrates. Counting carbohydrates is important to keep your blood  glucose at a healthy level, especially if you are using insulin or taking certain medicines for diabetes mellitus. Alcohol Alcohol can cause sudden decreases in blood glucose (hypoglycemia), especially if you use insulin or take certain medicines for diabetes mellitus. Hypoglycemia can be a life-threatening condition. Symptoms of hypoglycemia (sleepiness, dizziness, and disorientation) are similar to symptoms of having too much alcohol.  If your health care provider has given you approval to drink alcohol, do so in moderation and use the following guidelines:  Women should not have more than one drink per day, and men should not have more than two drinks per day. One drink is equal to:  12 oz of beer.  5 oz of wine.  1 oz of hard liquor.  Do not drink on an empty stomach.  Keep yourself hydrated. Have water, diet soda, or unsweetened iced tea.  Regular soda, juice, and other mixers might contain a lot of carbohydrates and should be counted. WHAT FOODS ARE NOT RECOMMENDED? As you make food choices, it is important to remember that all foods are not the same. Some foods have fewer nutrients per serving than other foods, even though they might have the same number of calories or carbohydrates. It is difficult to get your body what it needs when you eat foods with fewer nutrients. Examples of foods that you should avoid that are high in calories and carbohydrates but low in nutrients include:  Trans fats (most processed foods list trans fats on the Nutrition Facts label).  Regular soda.  Juice.  Candy.  Sweets, such  as cake, pie, doughnuts, and cookies.  Fried foods. WHAT FOODS CAN I EAT? Eat nutrient-rich foods, which will nourish your body and keep you healthy. The food you should eat also will depend on several factors, including:  The calories you need.  The medicines you take.  Your weight.  Your blood glucose level.  Your blood pressure level.  Your cholesterol  level. You should eat a variety of foods, including:  Protein.  Lean cuts of meat.  Proteins low in saturated fats, such as fish, egg whites, and beans. Avoid processed meats.  Fruits and vegetables.  Fruits and vegetables that may help control blood glucose levels, such as apples, mangoes, and yams.  Dairy products.  Choose fat-free or low-fat dairy products, such as milk, yogurt, and cheese.  Grains, bread, pasta, and rice.  Choose whole grain products, such as multigrain bread, whole oats, and brown rice. These foods may help control blood pressure.  Fats.  Foods containing healthful fats, such as nuts, avocado, olive oil, canola oil, and fish. DOES EVERYONE WITH DIABETES MELLITUS HAVE THE SAME MEAL PLAN? Because every person with diabetes mellitus is different, there is not one meal plan that works for everyone. It is very important that you meet with a dietitian who will help you create a meal plan that is just right for you.   This information is not intended to replace advice given to you by your health care provider. Make sure you discuss any questions you have with your health care provider.   Document Released: 06/22/2005 Document Revised: 10/16/2014 Document Reviewed: 08/22/2013 Elsevier Interactive Patient Education [employer].

## 2016-03-01 NOTE — Progress Notes (Signed)
Pre visit review using our clinic review tool, if applicable. No additional management support is needed unless otherwise documented below in the visit note. 

## 2016-03-01 NOTE — Progress Notes (Signed)
Subjective:    Patient ID: Beverly Wright, female    DOB: 17-Oct-1970, 45 y.o.   MRN: [id number]  HPI  Beverly Wright is a 45 year old female who presents today for complete physical.  Immunizations: -Tetanus: Completed in March 2016 -Influenza: She did not complete last season.   Diet: She endorses a fair diet. Breakfast: Skips Lunch: Soup, Games developer: Pasta, pork chops, chicken, steak, little vegetables, bread, potatoes, mac and cheese Snacks: Fruit with Carmel sauce, fruit dip, chips Desserts: Occasionally, recently cut back on sweets. Beverages: Gatorade, coke, water (1 liter per day)  Exercise: She does not currently exercise. Eye exam: Completed 1 year ago, has noticed slight change in vision. Dental exam: Completed several years ago. Pap Smear: Completed several years ago. Would like to defer until next year. Mammogram: Has not completed in several years.   Review of Systems  Constitutional: Negative for unexpected weight change.  HENT: Negative for rhinorrhea.   Respiratory: Negative for cough and shortness of breath.   Cardiovascular: Negative for chest pain.  Gastrointestinal: Negative for diarrhea and constipation.  Genitourinary: Negative for difficulty urinating.  Musculoskeletal: Positive for back pain and arthralgias.       Moderate to severe pain to mid lower back with radiation to bilateral buttocks.  Skin: Negative for rash.  Neurological: Negative for dizziness and headaches.       Numbness to right hand from procedure at pain management.  Psychiatric/Behavioral:       Denies concerns for anxiety and depression       Past Medical History  Diagnosis Date  . Back pain   . Attention deficit hyperactivity disorder (ADHD) 02/05/2015    dx age 76      Social History   Social History  . Marital Status: Married    Spouse Name: N/A  . Number of Children: N/A  . Years of Education: N/A   Occupational History  . Not on file.   Social History  Main Topics  . Smoking status: Current Some Day Smoker -- 0.20 packs/day    Types: Cigarettes  . Smokeless tobacco: Not on file  . Alcohol Use: No  . Drug Use: No  . Sexual Activity: Not on file   Other Topics Concern  . Not on file   Social History Narrative   Married.   Has custody of her grandson.   Works as a Programme researcher, broadcasting/film/video.   Also aspires to go to nursing school    Past Surgical History  Procedure Laterality Date  . Hernia repair      Family History  Problem Relation Age of Onset  . Adopted: Yes  . Other      adopted    No Known Allergies  Current Outpatient Prescriptions on File Prior to Visit  Medication Sig Dispense Refill  . albuterol (PROVENTIL HFA;VENTOLIN HFA) 108 (90 Base) MCG/ACT inhaler Inhale 2 puffs into the lungs every 6 (six) hours as needed for wheezing or shortness of breath. 1 Inhaler 2  . amphetamine-dextroamphetamine (ADDERALL XR) 20 MG 24 hr capsule Take 2 capsules (40 mg total) by mouth daily. 60 capsule 0  . baclofen (LIORESAL) 10 MG tablet Take 10 mg by mouth 2 (two) times daily.    . cefUROXime (CEFTIN) 250 MG tablet Take 1 tablet (250 mg total) by mouth 2 (two) times daily with a meal. 14 tablet 0  . pregabalin (LYRICA) 150 MG capsule Take 1 capsule (150 mg total) by mouth 2 (two) times daily. Reported on  09/29/2015 60 capsule 0  . cetirizine (ZYRTEC) 10 MG tablet Take 1 tablet (10 mg total) by mouth daily. (Patient not taking: Reported on 02/10/2016) 30 tablet 11  . traMADol (ULTRAM) 50 MG tablet Take 1 tablet (50 mg total) by mouth every 8 (eight) hours as needed. (Patient not taking: Reported on 03/01/2016) 30 tablet 0   Current Facility-Administered Medications on File Prior to Visit  Medication Dose Route Frequency Provider Last Rate Last Dose  . bupivacaine (PF) (MARCAINE) 0.25 % injection 30 mL  30 mL Other Once Mohammed Kindle, MD      . lactated ringers infusion 1,000 mL  1,000 mL Intravenous Continuous Mohammed Kindle, MD      . orphenadrine  (NORFLEX) injection 60 mg  60 mg Intramuscular Once Mohammed Kindle, MD   60 mg at 02/21/16 1135  . triamcinolone acetonide (KENALOG-40) injection 40 mg  40 mg Other Once Mohammed Kindle, MD        BP 124/86 mmHg  Pulse 108  Temp(Src) 98.1 F (36.7 C) (Oral)  Ht 5\' 7"  (1.702 m)  Wt 224 lb 6.4 oz (101.787 kg)  BMI 35.14 kg/m2  SpO2 98%  LMP 10/19/2015    Objective:   Physical Exam  Constitutional: She is oriented to person, place, and time. She appears well-nourished.  HENT:  Right Ear: Tympanic membrane and ear canal normal.  Left Ear: Tympanic membrane and ear canal normal.  Nose: Nose normal.  Mouth/Throat: Oropharynx is clear and moist.  Eyes: Conjunctivae and EOM are normal. Pupils are equal, round, and reactive to light.  Neck: Neck supple. No thyromegaly present.  Cardiovascular: Normal rate and regular rhythm.   No murmur heard. Pulmonary/Chest: Effort normal and breath sounds normal. She has no rales.  Abdominal: Soft. Bowel sounds are normal. There is no tenderness.  Musculoskeletal:       Lumbar back: She exhibits decreased range of motion and pain. She exhibits no tenderness and no spasm.  Lymphadenopathy:    She has no cervical adenopathy.  Neurological: She is alert and oriented to person, place, and time. She has normal reflexes. No cranial nerve deficit.  Skin: Skin is warm and dry. No rash noted.  Psychiatric: She has a normal mood and affect.          Assessment & Plan:

## 2016-03-01 NOTE — Assessment & Plan Note (Signed)
Td UTD. Pap and mammogram due. Mammogram ordered, patient would like to defer Pap until next year. Discussed the importance of a healthy diet and regular exercise in order for weight loss and to reduce risk of other medical diseases.  Labs with prediabetes, discussed risks and improvements in diet. Exam with moderate decrease in ROM with pain to lumbar spine, otherwise unremarkable. Repeat A1C in 6 months, follow up in 1 year.

## 2016-03-01 NOTE — Assessment & Plan Note (Addendum)
Followed with [facility] pain management, no improvement with injection, patient referred to [facility]. Will provide patient with Tramadol as needed for now until she's evaluated. Continue Lyrica and Baclofen.

## 2016-03-01 NOTE — Assessment & Plan Note (Signed)
Stable on current regimen   

## 2016-03-02 ENCOUNTER — Encounter: Payer: Self-pay | Admitting: Primary Care

## 2016-03-02 ENCOUNTER — Telehealth: Payer: Self-pay | Admitting: Primary Care

## 2016-03-02 NOTE — Telephone Encounter (Signed)
Spoken and notified [facility] Also notified this is temporary until either the injection takes effect or when she gets in with pain management.

## 2016-03-02 NOTE — Telephone Encounter (Signed)
Beverly Wright, please call in Tramadol 100 mg tablets. Take 1 tablet by mouth every 6 hours as needed for severe pain. #120, no refills.

## 2016-03-02 NOTE — Telephone Encounter (Signed)
[facility]

## 2016-03-03 ENCOUNTER — Other Ambulatory Visit: Payer: Self-pay | Admitting: Primary Care

## 2016-03-03 ENCOUNTER — Encounter: Payer: BLUE CROSS/BLUE SHIELD | Admitting: Primary Care

## 2016-03-13 ENCOUNTER — Encounter: Payer: Self-pay | Admitting: Primary Care

## 2016-03-14 ENCOUNTER — Ambulatory Visit: Payer: BLUE CROSS/BLUE SHIELD

## 2016-03-15 ENCOUNTER — Ambulatory Visit: Payer: BLUE CROSS/BLUE SHIELD | Admitting: Pain Medicine

## 2016-03-15 ENCOUNTER — Telehealth: Payer: Self-pay | Admitting: Pain Medicine

## 2016-03-15 NOTE — Telephone Encounter (Signed)
Thank you :)

## 2016-03-15 NOTE — Telephone Encounter (Addendum)
Patient was referred to [facility], 03-15-16 appt should have been canceled

## 2016-03-17 DIAGNOSIS — M5126 Other intervertebral disc displacement, lumbar region: Secondary | ICD-10-CM | POA: Insufficient documentation

## 2016-03-21 ENCOUNTER — Encounter: Payer: Self-pay | Admitting: Primary Care

## 2016-03-22 ENCOUNTER — Telehealth: Payer: Self-pay | Admitting: Primary Care

## 2016-03-22 ENCOUNTER — Other Ambulatory Visit: Payer: Self-pay | Admitting: Primary Care

## 2016-03-22 DIAGNOSIS — F909 Attention-deficit hyperactivity disorder, unspecified type: Secondary | ICD-10-CM

## 2016-03-22 MED ORDER — AMPHETAMINE-DEXTROAMPHET ER 20 MG PO CP24: 20.0000 mg | ORAL_CAPSULE | Freq: Every day | ORAL | Status: DC

## 2016-03-22 MED ORDER — AMPHETAMINE-DEXTROAMPHET ER 20 MG PO CP24: 40.0000 mg | ORAL_CAPSULE | Freq: Every day | ORAL | Status: DC

## 2016-03-22 NOTE — Telephone Encounter (Signed)
Patient returned Chan's call. °

## 2016-03-22 NOTE — Telephone Encounter (Signed)
Called patient and notified her prescriptions are ready for pick up.

## 2016-04-26 ENCOUNTER — Encounter: Payer: Self-pay | Admitting: Primary Care

## 2016-04-27 ENCOUNTER — Encounter: Payer: Self-pay | Admitting: Primary Care

## 2016-04-27 ENCOUNTER — Telehealth: Payer: Self-pay | Admitting: Primary Care

## 2016-04-27 DIAGNOSIS — F909 Attention-deficit hyperactivity disorder, unspecified type: Secondary | ICD-10-CM

## 2016-04-27 MED ORDER — AMPHETAMINE-DEXTROAMPHET ER 20 MG PO CP24: 40.0000 mg | ORAL_CAPSULE | Freq: Every day | ORAL | Status: DC

## 2016-04-27 NOTE — Telephone Encounter (Signed)
[facility] for Adderall. They will shred and I will print new prescriptions with the correct dosing. Patient notified on my chart that prescriptions are ready for pickup at her convenience.

## 2016-06-19 ENCOUNTER — Encounter: Payer: Self-pay | Admitting: Primary Care

## 2016-06-19 ENCOUNTER — Ambulatory Visit (INDEPENDENT_AMBULATORY_CARE_PROVIDER_SITE_OTHER): Payer: BLUE CROSS/BLUE SHIELD | Admitting: Primary Care

## 2016-06-19 ENCOUNTER — Telehealth: Payer: Self-pay | Admitting: Primary Care

## 2016-06-19 VITALS — BP 134/86 | HR 87 | Temp 98.4°F | Ht 67.0 in | Wt 228.0 lb

## 2016-06-19 DIAGNOSIS — N644 Mastodynia: Secondary | ICD-10-CM | POA: Diagnosis not present

## 2016-06-19 NOTE — Telephone Encounter (Signed)
Pt has appt with Allie Bossier NP 06/19/16 at 12:15.

## 2016-06-19 NOTE — Patient Instructions (Signed)
Stop by the front desk and speak with either Rosaria Ferries or Ebony Hail regarding your Mammogram. Please schedule this for this week.  It was a pleasure to see you today!

## 2016-06-19 NOTE — Telephone Encounter (Signed)
[facility]  Patient Name: Beverly Wright  DOB: 1971/09/04    Initial Comment Caller states for week having sharp pain in left breast; hurts to even take bra off; spotting some today; feels like someone ripping female organs from wall; moved some furniture over the weekend; history of hernia in groin area when young;    Nurse Assessment  Nurse: Robby Sermon, RN, April Date/Time (Eastern Time): 06/19/2016 9:02:16 AM  Confirm and document reason for call. If symptomatic, describe symptoms. You must click the next button to save text entered. ---Caller states she is having extreme in her left breast, she says even shower water hurts severely as well as taking off her bra. There is no redness or warmth. She states it is the underside of her breast, deep. Saturday she rearranged living room furniture, her husband helped her rearrange furniture in bedroom. Now she began spotting yesterday but only enough when she wipes. This morning she is experiencing severe abdominal pain, right above the pelvic bone. She is also radiating to where her ovaries are as well as scars from hernia repairs.  Has the patient traveled out of the country within the last 30 days? ---No  Does the patient have any new or worsening symptoms? ---Yes  Will a triage be completed? ---Yes  Related visit to physician within the last 2 weeks? ---No  Does the PT have any chronic conditions? (i.e. diabetes, asthma, etc.) ---Yes  List chronic conditions. ---asthma, torn disc between L4 and L5  Is the patient pregnant or possibly pregnant? (Ask all females between the ages of 49-55) ---No  Is this a behavioral health or substance abuse call? ---No     Guidelines    Guideline Title Affirmed Question Affirmed Notes  Breast Symptoms Change in shape or appearance of breast    Final Disposition User   See PCP When Office is Open (within 3 days) Robby Sermon,  RN, April    Comments  Appointment 06/19/16 12:15 with Alma Friendly at [facility]. Appointment made by Debbora Dus RN.   Referrals  REFERRED TO PCP OFFICE   Disagree/Comply: Comply

## 2016-06-19 NOTE — Progress Notes (Signed)
Pre visit review using our clinic review tool, if applicable. No additional management support is needed unless otherwise documented below in the visit note. 

## 2016-06-19 NOTE — Telephone Encounter (Signed)
Noted  

## 2016-06-19 NOTE — Progress Notes (Signed)
Subjective:    Patient ID: Beverly Wright, female    DOB: [birth year], 45 y.o.   MRN: [id number]  HPI  Beverly Wright is a 45 year old female who presents today with a chief complaint of breast pain. Her pain is located to the 6 o'clock position of her left breast which has been present for the past 1 week. She describes her pain as sharp and constant. Her grandson layed down onto her left breast several days ago which caused a lot of pain. Use of her bra causes discomfort. She has noticed swelling. Denies erythema, discharge, changes in skin texture. She recently started spotting several days ago and is experiencing menstrual cramping. She has a history of irregular periods for the past 2 years.    Review of Systems  Constitutional: Negative for fatigue, fever and unexpected weight change.  Cardiovascular:       Left breast pain  Genitourinary:       Menstrual cramping  Skin: Negative for color change, rash and wound.       Past Medical History:  Diagnosis Date  . Attention deficit hyperactivity disorder (ADHD) 02/05/2015   dx age 21   . Back pain      Social History   Social History  . Marital status: Married    Spouse name: N/A  . Number of children: N/A  . Years of education: N/A   Occupational History  . Not on file.   Social History Main Topics  . Smoking status: Current Some Day Smoker    Packs/day: 0.20    Types: Cigarettes  . Smokeless tobacco: Not on file  . Alcohol use No  . Drug use: No  . Sexual activity: Not on file   Other Topics Concern  . Not on file   Social History Narrative   Married.   Has custody of her grandson.   Works as a Programme researcher, broadcasting/film/video.   Also aspires to go to nursing school    Past Surgical History:  Procedure Laterality Date  . HERNIA REPAIR      Family History  Problem Relation Age of Onset  . Adopted: Yes  . Other      adopted    No Known Allergies  Current Outpatient Prescriptions on File Prior to Visit  Medication Sig  Dispense Refill  . amphetamine-dextroamphetamine (ADDERALL XR) 20 MG 24 hr capsule Take 2 capsules (40 mg total) by mouth daily. 60 capsule 0  . amphetamine-dextroamphetamine (ADDERALL XR) 20 MG 24 hr capsule Take 2 capsules (40 mg total) by mouth daily. 60 capsule 0  . amphetamine-dextroamphetamine (ADDERALL XR) 20 MG 24 hr capsule Take 2 capsules (40 mg total) by mouth daily. 60 capsule 0  . baclofen (LIORESAL) 10 MG tablet Take 1 tablet (10 mg total) by mouth 2 (two) times daily. 180 each 2  . PROAIR HFA 108 (90 Base) MCG/ACT inhaler INHALE 2 PUFFS INTO THE LUNGS EVERY 6 (SIX) HOURS AS NEEDED FOR WHEEZING OR SHORTNESS OF BREATH. 8.5 Inhaler 2   Current Facility-Administered Medications on File Prior to Visit  Medication Dose Route Frequency Provider Last Rate Last Dose  . bupivacaine (PF) (MARCAINE) 0.25 % injection 30 mL  30 mL Other Once Mohammed Kindle, MD      . lactated ringers infusion 1,000 mL  1,000 mL Intravenous Continuous Mohammed Kindle, MD      . orphenadrine (NORFLEX) injection 60 mg  60 mg Intramuscular Once Mohammed Kindle, MD      . triamcinolone acetonide (  KENALOG-40) injection 40 mg  40 mg Other Once Mohammed Kindle, MD        BP 134/86   Pulse 87   Temp 98.4 F (36.9 C) (Oral)   Ht 5\' 7"  (1.702 m)   Wt 228 lb (103.4 kg)   SpO2 98%   BMI 35.71 kg/m    Objective:   Physical Exam  Constitutional: She appears well-nourished.  Neck: Neck supple.  Cardiovascular: Normal rate.   Pulmonary/Chest: Effort normal. Right breast exhibits no mass, no skin change and no tenderness. Left breast exhibits mass and tenderness. Left breast exhibits no skin change.    Dense breast tissue to left when compared to right.  Skin: Skin is warm and dry. No erythema.          Assessment & Plan:  Breast Pain:  Located to left breast x 1 week. Exam with tenderness to numerous positions. Tissue to left breast more dense than right. No recent mammogram on file. Could be related to  recent initiation of menstrual period, but given no recent mammogram on file, will obtain diagnostic mammogram and ultrasound. No other alarm signs.   Sheral Flow, NP

## 2016-06-23 ENCOUNTER — Encounter: Payer: Self-pay | Admitting: Primary Care

## 2016-07-06 ENCOUNTER — Other Ambulatory Visit: Payer: BLUE CROSS/BLUE SHIELD

## 2016-07-06 ENCOUNTER — Ambulatory Visit: Payer: BLUE CROSS/BLUE SHIELD

## 2016-07-18 ENCOUNTER — Other Ambulatory Visit: Payer: Self-pay

## 2016-07-18 DIAGNOSIS — F909 Attention-deficit hyperactivity disorder, unspecified type: Secondary | ICD-10-CM

## 2016-07-18 MED ORDER — AMPHETAMINE-DEXTROAMPHET ER 20 MG PO CP24: 40.0000 mg | ORAL_CAPSULE | Freq: Every day | ORAL | 0 refills | Status: DC

## 2016-07-18 NOTE — Telephone Encounter (Signed)
Pt left v/m requesting rx for Adderall. Call when ready for pick up. Last printed #60 x 2. Last annual 03/01/16.

## 2016-07-19 ENCOUNTER — Encounter: Payer: Self-pay | Admitting: Primary Care

## 2016-07-19 ENCOUNTER — Other Ambulatory Visit: Payer: Self-pay | Admitting: Primary Care

## 2016-07-19 NOTE — Telephone Encounter (Addendum)
Beverly Wright notified patient thru [facility].

## 2016-08-23 ENCOUNTER — Encounter: Payer: Self-pay | Admitting: Primary Care

## 2016-08-27 ENCOUNTER — Other Ambulatory Visit: Payer: Self-pay | Admitting: Primary Care

## 2016-08-27 DIAGNOSIS — E559 Vitamin D deficiency, unspecified: Secondary | ICD-10-CM

## 2016-08-27 DIAGNOSIS — R7303 Prediabetes: Secondary | ICD-10-CM

## 2016-08-28 ENCOUNTER — Other Ambulatory Visit (INDEPENDENT_AMBULATORY_CARE_PROVIDER_SITE_OTHER): Payer: BLUE CROSS/BLUE SHIELD

## 2016-08-28 DIAGNOSIS — E559 Vitamin D deficiency, unspecified: Secondary | ICD-10-CM

## 2016-08-28 DIAGNOSIS — R7303 Prediabetes: Secondary | ICD-10-CM | POA: Diagnosis not present

## 2016-08-28 LAB — HEMOGLOBIN A1C: Hgb A1c MFr Bld: 5.8 % (ref 4.6–6.5)

## 2016-08-28 LAB — VITAMIN D 25 HYDROXY (VIT D DEFICIENCY, FRACTURES): VITD: 41.28 ng/mL (ref 30.00–100.00)

## 2016-09-11 ENCOUNTER — Encounter: Payer: Self-pay | Admitting: Primary Care

## 2016-09-11 NOTE — Telephone Encounter (Signed)
Please see my chart message. Will you check the [facility] for her immunizations. Looks like she needs a copy. Thanks.

## 2016-09-12 NOTE — Telephone Encounter (Signed)
See My Chart message. Looks like she won't be in [place]. Can we get her immunizations from her previous PCP in [place]? Maybe call [facility] and ask specifically what she needs proof of?

## 2016-09-13 DIAGNOSIS — M47816 Spondylosis without myelopathy or radiculopathy, lumbar region: Secondary | ICD-10-CM | POA: Insufficient documentation

## 2016-09-14 NOTE — Telephone Encounter (Signed)
Will you please respond to patient with an update?

## 2016-10-04 ENCOUNTER — Ambulatory Visit (INDEPENDENT_AMBULATORY_CARE_PROVIDER_SITE_OTHER): Payer: BLUE CROSS/BLUE SHIELD

## 2016-10-04 DIAGNOSIS — Z111 Encounter for screening for respiratory tuberculosis: Secondary | ICD-10-CM | POA: Diagnosis not present

## 2016-10-04 DIAGNOSIS — Z23 Encounter for immunization: Secondary | ICD-10-CM | POA: Diagnosis not present

## 2016-10-04 NOTE — Progress Notes (Signed)
Pre visit review using our clinic review tool, if applicable. No additional management support is needed unless otherwise documented below in the visit note. 

## 2016-10-07 LAB — TB SKIN TEST
INDURATION: 0 mm
TB SKIN TEST: NEGATIVE

## 2016-10-10 ENCOUNTER — Encounter: Payer: Self-pay | Admitting: *Deleted

## 2016-10-19 ENCOUNTER — Other Ambulatory Visit: Payer: Self-pay | Admitting: Primary Care

## 2016-10-19 DIAGNOSIS — F909 Attention-deficit hyperactivity disorder, unspecified type: Secondary | ICD-10-CM

## 2016-10-19 MED ORDER — AMPHETAMINE-DEXTROAMPHET ER 20 MG PO CP24: 40.0000 mg | ORAL_CAPSULE | Freq: Every day | ORAL | 0 refills | Status: DC

## 2016-10-19 MED ORDER — AMPHETAMINE-DEXTROAMPHET ER 20 MG PO CP24
40.0000 mg | ORAL_CAPSULE | Freq: Every day | ORAL | 0 refills | Status: DC
Start: 2016-10-19 — End: 2017-01-17

## 2016-10-20 ENCOUNTER — Encounter: Payer: Self-pay | Admitting: Primary Care

## 2016-10-23 ENCOUNTER — Other Ambulatory Visit: Payer: Self-pay | Admitting: Primary Care

## 2016-11-26 ENCOUNTER — Other Ambulatory Visit: Payer: Self-pay | Admitting: Primary Care

## 2016-11-26 DIAGNOSIS — G8929 Other chronic pain: Secondary | ICD-10-CM

## 2016-11-26 DIAGNOSIS — M5442 Lumbago with sciatica, left side: Principal | ICD-10-CM

## 2016-11-26 DIAGNOSIS — M5441 Lumbago with sciatica, right side: Principal | ICD-10-CM

## 2016-11-27 NOTE — Telephone Encounter (Signed)
Ok to refill? Electronically refill request for baclofen (LIORESAL) 10 MG tablet #180 with 2 refills. Last prescribed on 03/01/2016. Last seen on 06/19/2016

## 2016-12-07 ENCOUNTER — Other Ambulatory Visit: Payer: Self-pay | Admitting: Primary Care

## 2016-12-07 ENCOUNTER — Encounter: Payer: Self-pay | Admitting: Primary Care

## 2016-12-07 DIAGNOSIS — M47816 Spondylosis without myelopathy or radiculopathy, lumbar region: Secondary | ICD-10-CM | POA: Insufficient documentation

## 2017-01-04 ENCOUNTER — Encounter: Payer: Self-pay | Admitting: Primary Care

## 2017-01-10 ENCOUNTER — Other Ambulatory Visit: Payer: Self-pay | Admitting: Radiology

## 2017-01-10 ENCOUNTER — Encounter: Payer: Self-pay | Admitting: Primary Care

## 2017-01-17 ENCOUNTER — Other Ambulatory Visit: Payer: Self-pay | Admitting: Primary Care

## 2017-01-17 DIAGNOSIS — F909 Attention-deficit hyperactivity disorder, unspecified type: Secondary | ICD-10-CM

## 2017-01-18 MED ORDER — AMPHETAMINE-DEXTROAMPHET ER 20 MG PO CP24: 40.0000 mg | ORAL_CAPSULE | Freq: Every day | ORAL | 0 refills | Status: DC

## 2017-01-18 NOTE — Telephone Encounter (Signed)
Please notify patient that her Adderall Rx's are ready for pick up. Ensure UDS and contract UTD.

## 2017-01-19 ENCOUNTER — Encounter: Payer: Self-pay | Admitting: Primary Care

## 2017-01-19 NOTE — Telephone Encounter (Signed)
Rx left in front office for pick up and meds not detected in predious UDS per Anda Kraft wants to repeat... Noted on Rx in front office Pt notified via mychart

## 2017-01-25 ENCOUNTER — Telehealth: Payer: Self-pay | Admitting: Primary Care

## 2017-01-25 NOTE — Telephone Encounter (Signed)
Please notify patient that she is due for her CPE in May 2018, we will need to see her at this time in order to continue filling Adderall as well. Any problems with Adderall? Is she taking this daily? Please schedule.

## 2017-01-26 NOTE — Telephone Encounter (Signed)
Message left for patient to return my call.  

## 2017-01-30 NOTE — Telephone Encounter (Signed)
Message left for patient to return my call.  

## 2017-01-31 NOTE — Telephone Encounter (Signed)
Pt returned your call. I sch her for cpe 5/25. She said she is not having any problems with her Adderall.

## 2017-02-16 ENCOUNTER — Other Ambulatory Visit: Payer: Self-pay | Admitting: Primary Care

## 2017-02-21 ENCOUNTER — Other Ambulatory Visit: Payer: Self-pay | Admitting: Primary Care

## 2017-02-21 DIAGNOSIS — Z0000 Encounter for general adult medical examination without abnormal findings: Secondary | ICD-10-CM

## 2017-02-21 DIAGNOSIS — R7303 Prediabetes: Secondary | ICD-10-CM

## 2017-02-21 DIAGNOSIS — E559 Vitamin D deficiency, unspecified: Secondary | ICD-10-CM

## 2017-02-28 ENCOUNTER — Other Ambulatory Visit: Payer: BLUE CROSS/BLUE SHIELD

## 2017-03-02 ENCOUNTER — Telehealth: Payer: Self-pay | Admitting: Primary Care

## 2017-03-02 ENCOUNTER — Encounter: Payer: Medicaid Other | Admitting: Primary Care

## 2017-03-02 ENCOUNTER — Encounter: Payer: BLUE CROSS/BLUE SHIELD | Admitting: Primary Care

## 2017-03-02 NOTE — Telephone Encounter (Signed)
Please reschedule and notify patient that she will need to be seen in our office for follow up for any additional refills of her medications. She is due for follow up and CPE. No fee.

## 2017-03-02 NOTE — Telephone Encounter (Signed)
Patient did not come in for their appointment today for cpe. Please let me know if patient needs to be contacted immediately for follow up or no follow up needed. Do you want to charge the NSF? °

## 2017-03-13 ENCOUNTER — Encounter: Payer: Self-pay | Admitting: Primary Care

## 2017-03-13 NOTE — Telephone Encounter (Signed)
Sent letter to pt to reschedule appointment

## 2017-04-13 ENCOUNTER — Telehealth: Payer: Self-pay

## 2017-04-13 NOTE — Telephone Encounter (Signed)
Pt said 2016 or 2017 pt had titers at [facility] [phone] had sent titers for Hep B, MMR,and varicella. Vallarie Mare do you know where titer results are? Pt request cb.

## 2017-04-16 NOTE — Telephone Encounter (Signed)
Message left for patient to return my call.  

## 2017-04-17 ENCOUNTER — Other Ambulatory Visit (INDEPENDENT_AMBULATORY_CARE_PROVIDER_SITE_OTHER): Payer: Self-pay

## 2017-04-17 ENCOUNTER — Ambulatory Visit (INDEPENDENT_AMBULATORY_CARE_PROVIDER_SITE_OTHER): Payer: Self-pay

## 2017-04-17 DIAGNOSIS — Z0000 Encounter for general adult medical examination without abnormal findings: Secondary | ICD-10-CM

## 2017-04-17 DIAGNOSIS — E559 Vitamin D deficiency, unspecified: Secondary | ICD-10-CM

## 2017-04-17 DIAGNOSIS — R7303 Prediabetes: Secondary | ICD-10-CM

## 2017-04-17 DIAGNOSIS — Z111 Encounter for screening for respiratory tuberculosis: Secondary | ICD-10-CM

## 2017-04-17 LAB — VITAMIN D 25 HYDROXY (VIT D DEFICIENCY, FRACTURES): VITD: 37.92 ng/mL (ref 30.00–100.00)

## 2017-04-17 LAB — COMPREHENSIVE METABOLIC PANEL
ALT: 20 U/L (ref 0–35)
AST: 17 U/L (ref 0–37)
Albumin: 4 g/dL (ref 3.5–5.2)
Alkaline Phosphatase: 71 U/L (ref 39–117)
BUN: 18 mg/dL (ref 6–23)
CALCIUM: 9 mg/dL (ref 8.4–10.5)
CHLORIDE: 105 meq/L (ref 96–112)
CO2: 28 meq/L (ref 19–32)
CREATININE: 0.78 mg/dL (ref 0.40–1.20)
GFR: 84.47 mL/min (ref >60.00)
Glucose, Bld: 111 mg/dL — ABNORMAL HIGH (ref 70–99)
Potassium: 4.6 mEq/L (ref 3.5–5.1)
Sodium: 140 mEq/L (ref 135–145)
Total Bilirubin: 0.3 mg/dL (ref 0.2–1.2)
Total Protein: 6.7 g/dL (ref 6.0–8.3)

## 2017-04-17 LAB — LIPID PANEL
CHOL/HDL RATIO: 3
Cholesterol: 194 mg/dL (ref 0–200)
HDL: 60.7 mg/dL (ref >39.00)
LDL CALC: 111 mg/dL — AB (ref 0–99)
NonHDL: 133.09
TRIGLYCERIDES: 110 mg/dL (ref 0.0–149.0)
VLDL: 22 mg/dL (ref 0.0–40.0)

## 2017-04-17 LAB — HEMOGLOBIN A1C: Hgb A1c MFr Bld: 5.8 % (ref 4.6–6.5)

## 2017-04-17 NOTE — Progress Notes (Signed)
Pt received a TB test on the right arm.

## 2017-04-19 LAB — TB SKIN TEST
Induration: 0 mm
TB SKIN TEST: NEGATIVE

## 2017-04-23 ENCOUNTER — Ambulatory Visit (INDEPENDENT_AMBULATORY_CARE_PROVIDER_SITE_OTHER): Payer: BLUE CROSS/BLUE SHIELD | Admitting: Primary Care

## 2017-04-23 ENCOUNTER — Other Ambulatory Visit (HOSPITAL_COMMUNITY)
Admission: RE | Admit: 2017-04-23 | Discharge: 2017-04-23 | Disposition: A | Discharge status: Home / Self Care | Payer: BLUE CROSS/BLUE SHIELD | Source: Ambulatory Visit | Attending: Primary Care | Admitting: Primary Care

## 2017-04-23 ENCOUNTER — Encounter: Payer: Self-pay | Admitting: Primary Care

## 2017-04-23 VITALS — Ht 67.0 in | Wt 230.4 lb

## 2017-04-23 DIAGNOSIS — F902 Attention-deficit hyperactivity disorder, combined type: Secondary | ICD-10-CM

## 2017-04-23 DIAGNOSIS — Z124 Encounter for screening for malignant neoplasm of cervix: Secondary | ICD-10-CM | POA: Insufficient documentation

## 2017-04-23 DIAGNOSIS — G8929 Other chronic pain: Secondary | ICD-10-CM

## 2017-04-23 DIAGNOSIS — Z0000 Encounter for general adult medical examination without abnormal findings: Secondary | ICD-10-CM | POA: Diagnosis not present

## 2017-04-23 DIAGNOSIS — M5442 Lumbago with sciatica, left side: Secondary | ICD-10-CM

## 2017-04-23 DIAGNOSIS — M5441 Lumbago with sciatica, right side: Secondary | ICD-10-CM

## 2017-04-23 DIAGNOSIS — Z020 Encounter for examination for admission to educational institution: Secondary | ICD-10-CM | POA: Diagnosis not present

## 2017-04-23 LAB — POC URINALSYSI DIPSTICK (AUTOMATED)
BILIRUBIN UA: NEGATIVE
Blood, UA: NEGATIVE
GLUCOSE UA: NEGATIVE
Ketones, UA: NEGATIVE
Leukocytes, UA: NEGATIVE
Nitrite, UA: NEGATIVE
Protein, UA: NEGATIVE
SPEC GRAV UA: 1.015 (ref 1.010–1.025)
UROBILINOGEN UA: 0.2 U/dL
pH, UA: 7 (ref 5.0–8.0)

## 2017-04-23 NOTE — Assessment & Plan Note (Signed)
2 negative urine drug screen results with active prescription for Adderall. First negative screen was in January 2018, the second being in April 2018. Based off the [place] controlled substance Registry she did fill the prescription during those months.  Discussed today that I will no longer be prescribing Adderall given the 2 negative urine drug screen results. She verbalized understanding.

## 2017-04-23 NOTE — Assessment & Plan Note (Signed)
Following with pain management, has not required use of her Hysingla in months. No evidence of filling this prescription in several months based off of the [place] controlled substance registry. Continue Cymbalta.

## 2017-04-23 NOTE — Addendum Note (Signed)
Addended by: Jacqualin Combes on: 04/23/2017 03:06 PM   Modules accepted: Orders

## 2017-04-23 NOTE — Assessment & Plan Note (Signed)
Immunizations up-to-date. Pap due, completed today. Mammogram up-to-date. Discussed the importance of a healthy diet and regular exercise in order for weight loss, and to reduce the risk of other medical problems. Exam unremarkable. Labs with prediabetes, borderline hyperlipidemia. Education provided. Follow-up in one year for annual exam. Nursing school forms completed.

## 2017-04-23 NOTE — Telephone Encounter (Signed)
Noted. Addressed in the office visit on 04/23/2017

## 2017-04-23 NOTE — Progress Notes (Signed)
Subjective:    Patient ID: Beverly Wright, female    DOB: 05-21-1971, 46 y.o.   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  Immunizations: -Tetanus: Complete din 2016 -Influenza: Completed last season   Diet: She endorses a fair diet. Breakfast: Skips Lunch: Sandwich, chips Dinner: Meat, starch, casseroles, green beans Snacks: None Desserts: Daily Beverages: Soda, herbal detox tea, little water  Exercise: She does not currently exercise Eye exam: Completed several years ago. Dental exam: Has not been in several years. Pap Smear: Due. Mammogram: Completed in March 2018   Review of Systems  Constitutional: Negative for unexpected weight change.  HENT: Negative for rhinorrhea.   Respiratory: Negative for cough and shortness of breath.   Cardiovascular: Negative for chest pain.  Gastrointestinal: Negative for constipation and diarrhea.       GERD  Genitourinary: Negative for difficulty urinating and menstrual problem.  Musculoskeletal: Positive for joint swelling. Negative for arthralgias and myalgias.       Right ankle swelling, gradually increasing during the day. Sits often during the day.  Skin: Negative for rash.  Allergic/Immunologic: Negative for environmental allergies.  Neurological: Negative for dizziness, numbness and headaches.  Psychiatric/Behavioral:       Denies concerns for anxiety or depression.       Past Medical History:  Diagnosis Date  . Attention deficit hyperactivity disorder (ADHD) 02/05/2015   dx age 34   . Back pain      Social History   Social History  . Marital status: Married    Spouse name: N/A  . Number of children: N/A  . Years of education: N/A   Occupational History  . Not on file.   Social History Main Topics  . Smoking status: Current Some Day Smoker    Packs/day: 0.20    Types:  Cigarettes  . Smokeless tobacco: Never Used  . Alcohol use No  . Drug use: No  . Sexual activity: Not on file   Other Topics Concern  . Not on file   Social History Narrative   Married.   Has custody of her grandson.   Works as a Programme researcher, broadcasting/film/video.   Also aspires to go to nursing school    Past Surgical History:  Procedure Laterality Date  . HERNIA REPAIR      Family History  Problem Relation Age of Onset  . Adopted: Yes  . Other Unknown        adopted    No Known Allergies  Current Outpatient Prescriptions on File Prior to Visit  Medication Sig Dispense Refill  . DULoxetine (CYMBALTA) 30 MG capsule Take 30 mg by mouth daily.   0  . PROAIR HFA 108 (90 Base) MCG/ACT inhaler INHALE 2 PUFFS INTO THE LUNGS EVERY 6 (SIX) HOURS AS NEEDED FOR WHEEZING OR SHORTNESS OF BREATH. 8.5 Inhaler 2  . amphetamine-dextroamphetamine (ADDERALL XR) 20 MG 24 hr capsule Take 2 capsules (40 mg total) by mouth daily. (Patient not taking: Reported on 04/23/2017) 60 capsule 0  . amphetamine-dextroamphetamine (ADDERALL XR) 20 MG 24 hr capsule Take 2 capsules (40 mg total) by mouth daily. (Patient not taking: Reported on 04/23/2017) 60 capsule 0  . amphetamine-dextroamphetamine (ADDERALL XR) 20 MG 24 hr capsule Take 2 capsules (40 mg total) by mouth daily. (Patient not taking: Reported on 04/23/2017) 60 capsule 0  . baclofen (LIORESAL) 10 MG  tablet TAKE 1 TABLET (10 MG TOTAL) BY MOUTH 2 (TWO) TIMES DAILY. (Patient not taking: Reported on 04/23/2017) 180 tablet 1   Current Facility-Administered Medications on File Prior to Visit  Medication Dose Route Frequency Provider Last Rate Last Dose  . bupivacaine (PF) (MARCAINE) 0.25 % injection 30 mL  30 mL Other Once Mohammed Kindle, MD      . lactated ringers infusion 1,000 mL  1,000 mL Intravenous Continuous Mohammed Kindle, MD      . orphenadrine (NORFLEX) injection 60 mg  60 mg Intramuscular Once Mohammed Kindle, MD      . triamcinolone acetonide (KENALOG-40) injection 40  mg  40 mg Other Once Mohammed Kindle, MD        Ht 5\' 7"  (1.702 m)   Wt 230 lb 6.4 oz (104.5 kg)   BMI 36.09 kg/m    Objective:   Physical Exam  Constitutional: She is oriented to person, place, and time. She appears well-nourished.  HENT:  Right Ear: Tympanic membrane and ear canal normal.  Left Ear: Tympanic membrane and ear canal normal.  Nose: Nose normal.  Mouth/Throat: Oropharynx is clear and moist.  Eyes: Pupils are equal, round, and reactive to light. Conjunctivae and EOM are normal.  Neck: Neck supple. No thyromegaly present.  Cardiovascular: Normal rate and regular rhythm.   No murmur heard. Pulmonary/Chest: Effort normal and breath sounds normal. She has no rales.  Abdominal: Soft. Bowel sounds are normal. There is no tenderness.  Musculoskeletal: Normal range of motion.  Mild swelling right lateral malleolus. Good ROM, no changes in skin color.  Lymphadenopathy:    She has no cervical adenopathy.  Neurological: She is alert and oriented to person, place, and time. She has normal reflexes. No cranial nerve deficit.  Skin: Skin is warm and dry. No rash noted.  Psychiatric: She has a normal mood and affect.          Assessment & Plan:

## 2017-04-23 NOTE — Patient Instructions (Addendum)
Start exercising. You should be getting 150 minutes of moderate intensity exercise weekly.  It's important to improve your diet by reducing consumption of fast food, fried food, processed snack foods, sugary drinks. Increase consumption of fresh vegetables and fruits, whole grains, water.  Ensure you are drinking 64 ounces of water daily.  We will notify you of your pap results after received.  It was a pleasure to see you today!

## 2017-04-24 ENCOUNTER — Telehealth: Payer: Self-pay | Admitting: Primary Care

## 2017-04-24 NOTE — Telephone Encounter (Signed)
Pt returned your call, please call back  Thanks

## 2017-04-24 NOTE — Telephone Encounter (Signed)
Spoken to patient and notified her that paperwork has completed as much as I can. Patient still need her 2 step PPD test which she had that nurse visit scheduled.  Left the paperwork in the front office for patient to pick up.

## 2017-04-25 LAB — CYTOLOGY - PAP
DIAGNOSIS: NEGATIVE
HPV (WINDOPATH): NOT DETECTED

## 2017-05-01 ENCOUNTER — Ambulatory Visit (INDEPENDENT_AMBULATORY_CARE_PROVIDER_SITE_OTHER): Payer: BLUE CROSS/BLUE SHIELD | Admitting: *Deleted

## 2017-05-01 DIAGNOSIS — Z111 Encounter for screening for respiratory tuberculosis: Secondary | ICD-10-CM

## 2017-05-04 LAB — TB SKIN TEST
INDURATION: 0 mm
TB SKIN TEST: NEGATIVE

## 2017-05-15 ENCOUNTER — Telehealth: Payer: Self-pay | Admitting: Primary Care

## 2017-05-15 ENCOUNTER — Encounter: Payer: Self-pay | Admitting: Primary Care

## 2017-05-15 NOTE — Telephone Encounter (Signed)
Spoken to patient. I will fill out for patient that she had flu vaccine in 09/2016. I already print the immunization summery for patient.   Notified patient as well that we do not have the flu vaccine available yet.

## 2017-05-15 NOTE — Telephone Encounter (Signed)
Pt dropped off form to be fixed. She's also requesting a print out if possible stating we don't have vaccination available yet. Says she will get kicked out of program without it.

## 2017-05-15 NOTE — Telephone Encounter (Signed)
Went to the front office. I have fixed the form and place the flu vaccine on 10/04/2016 for patient. Raquel Sarna printed letter for patient regarding flu vaccine is not available yet.

## 2017-05-15 NOTE — Telephone Encounter (Signed)
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  Patient said her flu shot wasn't listed.  The school said she had to have proof of a flu shot between August and December of the current flu season.  Please call patient back. Patient starts school a week from tomorrow.

## 2017-06-19 ENCOUNTER — Other Ambulatory Visit: Payer: Self-pay | Admitting: Primary Care

## 2017-07-18 ENCOUNTER — Ambulatory Visit (INDEPENDENT_AMBULATORY_CARE_PROVIDER_SITE_OTHER): Payer: BLUE CROSS/BLUE SHIELD

## 2017-07-18 DIAGNOSIS — Z23 Encounter for immunization: Secondary | ICD-10-CM | POA: Diagnosis not present

## 2017-08-10 ENCOUNTER — Ambulatory Visit: Payer: Self-pay | Admitting: *Deleted

## 2017-08-10 ENCOUNTER — Emergency Department (HOSPITAL_COMMUNITY): Payer: BLUE CROSS/BLUE SHIELD

## 2017-08-10 ENCOUNTER — Emergency Department (HOSPITAL_COMMUNITY)
Admission: EM | Admit: 2017-08-10 | Discharge: 2017-08-10 | Disposition: A | Discharge status: Home / Self Care | Payer: BLUE CROSS/BLUE SHIELD | Attending: Emergency Medicine | Admitting: Emergency Medicine

## 2017-08-10 ENCOUNTER — Encounter (HOSPITAL_COMMUNITY): Payer: Self-pay | Admitting: Emergency Medicine

## 2017-08-10 DIAGNOSIS — J45909 Unspecified asthma, uncomplicated: Secondary | ICD-10-CM | POA: Diagnosis not present

## 2017-08-10 DIAGNOSIS — R1031 Right lower quadrant pain: Secondary | ICD-10-CM

## 2017-08-10 DIAGNOSIS — R112 Nausea with vomiting, unspecified: Secondary | ICD-10-CM | POA: Diagnosis not present

## 2017-08-10 DIAGNOSIS — Z87891 Personal history of nicotine dependence: Secondary | ICD-10-CM | POA: Insufficient documentation

## 2017-08-10 DIAGNOSIS — Z79899 Other long term (current) drug therapy: Secondary | ICD-10-CM | POA: Insufficient documentation

## 2017-08-10 DIAGNOSIS — R197 Diarrhea, unspecified: Secondary | ICD-10-CM | POA: Insufficient documentation

## 2017-08-10 LAB — COMPREHENSIVE METABOLIC PANEL
ALBUMIN: 4 g/dL (ref 3.5–5.0)
ALT: 19 U/L (ref 14–54)
AST: 18 U/L (ref 15–41)
Alkaline Phosphatase: 76 U/L (ref 38–126)
Anion gap: 10 (ref 5–15)
BILIRUBIN TOTAL: 0.4 mg/dL (ref 0.3–1.2)
BUN: 17 mg/dL (ref 6–20)
CHLORIDE: 102 mmol/L (ref 101–111)
CO2: 26 mmol/L (ref 22–32)
CREATININE: 0.8 mg/dL (ref 0.44–1.00)
Calcium: 8.6 mg/dL — ABNORMAL LOW (ref 8.9–10.3)
GFR calc Af Amer: >60 mL/min (ref >60)
GLUCOSE: 110 mg/dL — AB (ref 65–99)
POTASSIUM: 4.4 mmol/L (ref 3.5–5.1)
Sodium: 138 mmol/L (ref 135–145)
Total Protein: 7.3 g/dL (ref 6.5–8.1)

## 2017-08-10 LAB — URINALYSIS, ROUTINE W REFLEX MICROSCOPIC
Bilirubin Urine: NEGATIVE
Glucose, UA: NEGATIVE mg/dL
Hgb urine dipstick: NEGATIVE
KETONES UR: NEGATIVE mg/dL
LEUKOCYTES UA: NEGATIVE
NITRITE: NEGATIVE
PH: 5 (ref 5.0–8.0)
Protein, ur: NEGATIVE mg/dL
SPECIFIC GRAVITY, URINE: 1.016 (ref 1.005–1.030)

## 2017-08-10 LAB — CBC
HEMATOCRIT: 42.6 % (ref 36.0–46.0)
Hemoglobin: 13.9 g/dL (ref 12.0–15.0)
MCH: 29.7 pg (ref 26.0–34.0)
MCHC: 32.6 g/dL (ref 30.0–36.0)
MCV: 91 fL (ref 78.0–100.0)
Platelets: 279 10*3/uL (ref 150–400)
RBC: 4.68 MIL/uL (ref 3.87–5.11)
RDW: 13.7 % (ref 11.5–15.5)
WBC: 8.7 10*3/uL (ref 4.0–10.5)

## 2017-08-10 LAB — LIPASE, BLOOD: LIPASE: 24 U/L (ref 11–51)

## 2017-08-10 LAB — I-STAT BETA HCG BLOOD, ED (MC, WL, AP ONLY): I-stat hCG, quantitative: <5 m[IU]/mL (ref <5)

## 2017-08-10 MED ORDER — ONDANSETRON HCL 4 MG PO TABS: 4.0000 mg | ORAL_TABLET | Freq: Three times a day (TID) | ORAL | 0 refills | Status: DC | PRN

## 2017-08-10 MED ORDER — IOPAMIDOL (ISOVUE-300) INJECTION 61%
100.0000 mL | Freq: Once | INTRAVENOUS | Status: AC | PRN
  Administered 2017-08-10: 100 mL via INTRAVENOUS

## 2017-08-10 MED ORDER — ONDANSETRON HCL 4 MG/2ML IJ SOLN
4.0000 mg | Freq: Once | INTRAMUSCULAR | Status: AC
  Administered 2017-08-10: 4 mg via INTRAVENOUS
  Filled 2017-08-10: qty 2

## 2017-08-10 MED ORDER — SODIUM CHLORIDE 0.9 % IV BOLUS (SEPSIS)
1000.0000 mL | Freq: Once | INTRAVENOUS | Status: AC
  Administered 2017-08-10: 1000 mL via INTRAVENOUS

## 2017-08-10 MED ORDER — MORPHINE SULFATE (PF) 2 MG/ML IV SOLN
2.0000 mg | Freq: Once | INTRAVENOUS | Status: AC
  Administered 2017-08-10: 2 mg via INTRAVENOUS
  Filled 2017-08-10: qty 1

## 2017-08-10 MED ORDER — HYDROCODONE-ACETAMINOPHEN 5-325 MG PO TABS
1.0000 | ORAL_TABLET | Freq: Four times a day (QID) | ORAL | 0 refills | Status: DC | PRN
Start: 2017-08-10 — End: 2017-12-28

## 2017-08-10 NOTE — ED Notes (Signed)
Patient transported to Ultrasound 

## 2017-08-10 NOTE — ED Provider Notes (Signed)
[facility] EMERGENCY DEPARTMENT Provider Note   CSN: [id number] Arrival date & time: 08/10/17  1107     History   Chief Complaint Chief Complaint  Patient presents with  . Diarrhea    HPI Beverly Wright is a 46 y.o. female.  HPI Patient presents with right lower quadrant pain that woke her at 3 AM this morning.  States there is some radiation to her back.  Had associated nausea and vomiting x2.  Also multiple episodes of loose stool.  No blood in stool or vomit.  Patient had subjective fevers and chills.  Patient also states she has had intermittent constipation and diarrhea for the past 1-2 months. Past Medical History:  Diagnosis Date  . Attention deficit hyperactivity disorder (ADHD) 02/05/2015   dx age 44   . Back pain     Patient Active Problem List   Diagnosis Date Noted  . Preventative health care 03/01/2016  . DDD (degenerative disc disease), lumbar 02/10/2016  . Facet syndrome, lumbar 02/10/2016  . Lumbar radiculopathy 02/10/2016  . Asthma 11/25/2015  . Chronic low back pain with bilateral sciatica 09/29/2015  . Attention deficit hyperactivity disorder (ADHD) 02/05/2015    Past Surgical History:  Procedure Laterality Date  . BIOPSY BREAST Right   . HERNIA REPAIR      OB History    Gravida Para Term Preterm AB Living   5 4 3 1 1 4    SAB TAB Ectopic Multiple Live Births   1               Home Medications    Prior to Admission medications   Medication Sig Start Date End Date Taking? Authorizing Provider  albuterol (PROAIR HFA) 108 (90 Base) MCG/ACT inhaler INHALE 2 PUFFS INTO THE LUNGS EVERY 6 (SIX) HOURS AS NEEDED FOR WHEEZING OR SHORTNESS OF BREATH. 06/19/17  Yes Pleas Koch, NP  Cholecalciferol (VITAMIN D PO) Take 1 capsule by mouth daily.   Yes [provider]  lansoprazole (PREVACID) 15 MG capsule Take 15 mg by mouth daily at 12 noon.   Yes [provider]  naproxen sodium (ALEVE) 220 MG tablet Take 440 mg by mouth.    Yes [provider]  HYDROcodone-acetaminophen (NORCO) 5-325 MG tablet Take 1 tablet by mouth every 6 (six) hours as needed for severe pain. 08/10/17   Julianne Rice, MD  ondansetron (ZOFRAN) 4 MG tablet Take 1 tablet (4 mg total) by mouth every 8 (eight) hours as needed for nausea or vomiting. 08/10/17   Julianne Rice, MD    Family History Family History  Adopted: Yes  Problem Relation Age of Onset  . Other Unknown        adopted    Social History Social History   Tobacco Use  . Smoking status: Former Smoker    Packs/day: 0.20    Years: 23.00    Pack years: 4.60    Types: Cigarettes    Last attempt to quit: 06/11/2017    Years since quitting: 0.1  . Smokeless tobacco: Never Used  Substance Use Topics  . Alcohol use: No    Alcohol/week: 0.0 oz  . Drug use: No     Allergies   Patient has no known allergies.   Review of Systems Review of Systems  Constitutional: Positive for chills and fever.  Respiratory: Negative for cough and shortness of breath.   Cardiovascular: Negative for chest pain.  Gastrointestinal: Positive for abdominal pain, diarrhea, nausea and vomiting. Negative for blood in  stool.  Genitourinary: Positive for flank pain. Negative for dysuria, frequency, hematuria, pelvic pain, vaginal bleeding and vaginal discharge.  Musculoskeletal: Positive for back pain. Negative for myalgias, neck pain and neck stiffness.  Skin: Negative for rash and wound.  Neurological: Negative for dizziness, weakness, light-headedness, numbness and headaches.  All other systems reviewed and are negative.    Physical Exam Updated Vital Signs BP 112/60 (BP Location: Left Arm)   Pulse 60   Temp 98.1 F (36.7 C) (Oral)   Resp 19   Ht 5\' 7"  (1.702 m)   Wt 99.8 kg (220 lb)   LMP 11/10/2016   SpO2 99%   BMI 34.46 kg/m   Physical Exam  Constitutional: She is oriented to person, place, and time. She appears well-developed and well-nourished.  HENT:  Head:  Normocephalic and atraumatic.  Mouth/Throat: Oropharynx is clear and moist.  Eyes: Pupils are equal, round, and reactive to light. EOM are normal.  Neck: Normal range of motion. Neck supple.  Cardiovascular: Normal rate and regular rhythm.  Exam reveals no gallop and no friction rub.   No murmur heard. Pulmonary/Chest: Effort normal and breath sounds normal.  Abdominal: Soft. Bowel sounds are normal. There is tenderness. There is no rebound and no guarding.  Patient with left lower quadrant and right lower quadrant tenderness to palpation.  Questionable rebound tenderness on the right.  Musculoskeletal: Normal range of motion. She exhibits no edema or tenderness.  No CVA tenderness bilaterally.  Neurological: She is alert and oriented to person, place, and time.  Moves all extremities without focal deficit.  Sensation fully intact.  Skin: Skin is warm and dry. Capillary refill takes less than 2 seconds. No rash noted. No erythema.  Psychiatric: She has a normal mood and affect. Her behavior is normal.  Nursing note and vitals reviewed.    ED Treatments / Results  Labs (all labs ordered are listed, but only abnormal results are displayed) Labs Reviewed  COMPREHENSIVE METABOLIC PANEL - Abnormal; Notable for the following components:      Result Value   Glucose, Bld 110 (*)    Calcium 8.6 (*)    All other components within normal limits  URINALYSIS, ROUTINE W REFLEX MICROSCOPIC - Abnormal; Notable for the following components:   APPearance HAZY (*)    All other components within normal limits  LIPASE, BLOOD  CBC  I-STAT BETA HCG BLOOD, ED ([facility], [facility], [facility] ONLY)    EKG  EKG Interpretation None       Radiology No results found.  Procedures Procedures (including critical care time)  Medications Ordered in ED Medications  sodium chloride 0.9 % bolus 1,000 mL (0 mLs Intravenous Stopped 08/10/17 1506)  morphine 2 MG/ML injection 2 mg (2 mg Intravenous Given 08/10/17 1322)    ondansetron (ZOFRAN) injection 4 mg (4 mg Intravenous Given 08/10/17 1322)  iopamidol (ISOVUE-300) 61 % injection 100 mL (100 mLs Intravenous Contrast Given 08/10/17 1421)     Initial Impression / Assessment and Plan / ED Course  I have reviewed the triage vital signs and the nursing notes.  Pertinent labs & imaging results that were available during my care of the patient were reviewed by me and considered in my medical decision making (see chart for details).    Ultrasound with questionable gallstones but no evidence of cholecystitis.  CT with no definite abnormality.  Patient states her symptoms are completely resolved after single dose of IV morphine. Patient will follow up with gastroenterology as an outpatient.  Return precautions given.  Final Clinical Impressions(s) / ED Diagnoses   Final diagnoses:  Nausea vomiting and diarrhea  Right lower quadrant abdominal pain    New Prescriptions This SmartLink is deprecated. Use AVSMEDLIST instead to display the medication list for a patient.   Julianne Rice, MD 08/12/17 [phone]

## 2017-08-10 NOTE — ED Triage Notes (Signed)
Patient reports loose, watery, foul smelling BMs x6 for past month. Patient states now has nausea, vomiting, and fevers. Denies any recent antibiotics. 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 Denies any urinary symptoms. Patient does report sharp, stabbing right lower abd pain.

## 2017-08-10 NOTE — Telephone Encounter (Signed)
C/o watery diarrhea for a month.  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  Started at 3:30am this morning with sharp abd pain radiating around to her back.  Husband is with her and is going to drive her to the ED now. Reason for Disposition . [1] SEVERE abdominal pain (e.g., excruciating) AND [2] present > 1 hour  Answer Assessment - Initial Assessment Questions 1. DIARRHEA SEVERITY: "How bad is the diarrhea?" "How many extra stools have you had in the past 24 hours than normal?"    - MILD: Few loose or mushy BMs; increase of 1-3 stools over normal daily number of stools; mild increase in ostomy output.   - MODERATE: Increase of 4-6 stools daily over normal; moderate increase in ostomy output.   - SEVERE (or Worst Possible): Increase of 7 or more stools daily over normal; moderate increase in ostomy output; incontinence.     Severe.  I'm going multiple times a day.   Goes all day. 2. ONSET: "When did the diarrhea begin?"      A month ago 3. BM CONSISTENCY: "How loose or watery is the diarrhea?"      Watery.  Darker brown than usual. 4. VOMITING: "Are you also vomiting?" If so, ask: "How many times in the past 24 hours?"      Vomiting since this morning.  I'm not taking any water or food. 5. ABDOMINAL PAIN: "Are you having any abdominal pain?" If yes: "What does it feel like?" (e.g., crampy, dull, intermittent, constant)      Sharp pain in abd and in back.  Lower right back and abd 6. ABDOMINAL PAIN SEVERITY: If present, ask: "How bad is the pain?"  (e.g., Scale 1-10; mild, moderate, or severe)    - MILD (1-3): doesn't interfere with normal activities, abdomen soft and not tender to touch     - MODERATE (4-7): interferes with normal activities or awakens from sleep, tender to touch     - SEVERE (8-10): excruciating pain, doubled over, unable to do any normal activities       Very severe 7. ORAL INTAKE: If vomiting, "Have you been able to drink liquids?"  "How much fluids have you had in the past 24 hours?"     No 8. HYDRATION: "Any signs of dehydration?" (e.g., dry mouth [not just dry lips], too weak to stand, dizziness, new weight loss) "When did you last urinate?"     Dizzy, very dry mouth, very sweaty 9. EXPOSURE: "Have you traveled to a foreign country recently?" "Have you been exposed to anyone with diarrhea?" "Could you have eaten any food that was spoiled?"     No , No  10. OTHER SYMPTOMS: "Do you have any other symptoms?" (e.g., fever, blood in stool)       No blood in stool.   I have not checked it. 11. PREGNANCY: "Is there any chance you are pregnant?" "When was your last menstrual period?"       No   Feb was last period.  Protocols used: [facility]

## 2017-08-10 NOTE — ED Notes (Signed)
Not in [place] for recheck of VS

## 2017-12-04 ENCOUNTER — Encounter: Payer: Self-pay | Admitting: Primary Care

## 2017-12-13 ENCOUNTER — Encounter: Payer: Self-pay | Admitting: Primary Care

## 2017-12-13 DIAGNOSIS — J4520 Mild intermittent asthma, uncomplicated: Secondary | ICD-10-CM

## 2017-12-13 MED ORDER — ALBUTEROL SULFATE HFA 108 (90 BASE) MCG/ACT IN AERS: 2.0000 | INHALATION_SPRAY | RESPIRATORY_TRACT | 0 refills | Status: DC | PRN

## 2017-12-28 ENCOUNTER — Encounter: Payer: Self-pay | Admitting: Primary Care

## 2017-12-28 ENCOUNTER — Ambulatory Visit: Payer: BLUE CROSS/BLUE SHIELD | Admitting: Primary Care

## 2017-12-28 VITALS — BP 114/80 | HR 90 | Temp 98.0°F | Wt 245.0 lb

## 2017-12-28 DIAGNOSIS — R7303 Prediabetes: Secondary | ICD-10-CM | POA: Insufficient documentation

## 2017-12-28 DIAGNOSIS — R195 Other fecal abnormalities: Secondary | ICD-10-CM | POA: Diagnosis not present

## 2017-12-28 DIAGNOSIS — J4520 Mild intermittent asthma, uncomplicated: Secondary | ICD-10-CM | POA: Diagnosis not present

## 2017-12-28 DIAGNOSIS — N951 Menopausal and female climacteric states: Secondary | ICD-10-CM

## 2017-12-28 DIAGNOSIS — R609 Edema, unspecified: Secondary | ICD-10-CM | POA: Insufficient documentation

## 2017-12-28 DIAGNOSIS — R600 Localized edema: Secondary | ICD-10-CM | POA: Diagnosis not present

## 2017-12-28 LAB — COMPREHENSIVE METABOLIC PANEL
ALBUMIN: 4.4 g/dL (ref 3.5–5.2)
ALT: 63 U/L — AB (ref 0–35)
AST: 78 U/L — ABNORMAL HIGH (ref 0–37)
Alkaline Phosphatase: 71 U/L (ref 39–117)
BUN: 16 mg/dL (ref 6–23)
CHLORIDE: 103 meq/L (ref 96–112)
CO2: 30 mEq/L (ref 19–32)
CREATININE: 0.83 mg/dL (ref 0.40–1.20)
Calcium: 9.6 mg/dL (ref 8.4–10.5)
GFR: 78.39 mL/min (ref >60.00)
Glucose, Bld: 88 mg/dL (ref 70–99)
Potassium: 4.2 mEq/L (ref 3.5–5.1)
SODIUM: 142 meq/L (ref 135–145)
Total Bilirubin: 0.3 mg/dL (ref 0.2–1.2)
Total Protein: 7.6 g/dL (ref 6.0–8.3)

## 2017-12-28 LAB — CBC WITH DIFFERENTIAL/PLATELET
BASOS ABS: 0 10*3/uL (ref 0.0–0.1)
Basophils Relative: 0.5 % (ref 0.0–3.0)
Eosinophils Absolute: 0.4 10*3/uL (ref 0.0–0.7)
Eosinophils Relative: 5.3 % — ABNORMAL HIGH (ref 0.0–5.0)
HCT: 40.9 % (ref 36.0–46.0)
Hemoglobin: 13.7 g/dL (ref 12.0–15.0)
LYMPHS ABS: 1.7 10*3/uL (ref 0.7–4.0)
Lymphocytes Relative: 24.4 % (ref 12.0–46.0)
MCHC: 33.6 g/dL (ref 30.0–36.0)
MCV: 90.5 fl (ref 78.0–100.0)
MONOS PCT: 7.4 % (ref 3.0–12.0)
Monocytes Absolute: 0.5 10*3/uL (ref 0.1–1.0)
NEUTROS ABS: 4.3 10*3/uL (ref 1.4–7.7)
NEUTROS PCT: 62.4 % (ref 43.0–77.0)
PLATELETS: 309 10*3/uL (ref 150.0–400.0)
RBC: 4.52 Mil/uL (ref 3.87–5.11)
RDW: 14.8 % (ref 11.5–15.5)
WBC: 6.9 10*3/uL (ref 4.0–10.5)

## 2017-12-28 LAB — TSH: TSH: 3.37 u[IU]/mL (ref 0.35–4.50)

## 2017-12-28 LAB — HEMOGLOBIN A1C: HEMOGLOBIN A1C: 5.9 % (ref 4.6–6.5)

## 2017-12-28 MED ORDER — VENLAFAXINE HCL ER 37.5 MG PO CP24
37.5000 mg | ORAL_CAPSULE | Freq: Every day | ORAL | 0 refills | Status: DC
Start: 2017-12-28 — End: 2018-03-27

## 2017-12-28 NOTE — Progress Notes (Signed)
Subjective:    Patient ID: Beverly Wright, female    DOB: [birth year], 47 y.o.   MRN: [id number]  HPI  Mr. Papania is a 47 year old female who presents today with multiple complaints.   1) Hot Flashes: No menstrual cycle since February 2018. She's experiencing symptoms of hot flashes, mood swings, bloating, difficulty staying asleep. She's never taken anything for her hot flashes including OTC treatment. She'll experience hot flashes during the day and also at night. Her flashes will wake her from sleep at night.   2) Lower Extremity Edema: Everyday occurrence and located to bilateral lower extremities from ankles to feet, and fingers. She notices her swelling throughout the day, does have swelling the morning which is better, but then progresses throughout the day. She does elevated her legs in the evening without a noticeable difference until morning. She is sedentary most of her day as she sits in a desk and recliner. She does not wear compression socks.   She started going to the gym one month ago, but has been more consistent starting this week. She is drinking 64 ounces of water daily.  Wt Readings from Last 3 Encounters:  12/28/17 245 lb (111.1 kg)  08/10/17 220 lb (99.8 kg)  04/23/17 230 lb 6.4 oz (104.5 kg)    3) Bowel Changes: Daily with episodes of diarrhea with her stool starting out very soft (not formed), then 2-3 liquid stools, then no bowel movement for the rest of her day. After 7 days of this she'll then have 1-2 days without a bowel movement. She denies changes in diet, fevers, chills, rectal bleeding. She does experience intermittent, infrequent epigastric pain that will wrap around her right flank. She was evaluated in the ED in November 2018 for those symptoms, negative gall bladder work up. Overall her epigastric symptoms are better.    Review of Systems  Constitutional: Negative for fever.  Cardiovascular:       Lower extremity edema   Gastrointestinal:  Positive for diarrhea. Negative for blood in stool, nausea and vomiting.  Genitourinary:       Hot flashes, mood swings, irritability        Past Medical History:  Diagnosis Date  . Attention deficit hyperactivity disorder (ADHD) 02/05/2015   dx age 63   . Back pain      Social History   Socioeconomic History  . Marital status: Married    Spouse name: Not on file  . Number of children: Not on file  . Years of education: Not on file  . Highest education level: Not on file  Occupational History  . Not on file  Social Needs  . Financial resource strain: Not on file  . Food insecurity:    Worry: Not on file    Inability: Not on file  . Transportation needs:    Medical: Not on file    Non-medical: Not on file  Tobacco Use  . Smoking status: Former Smoker    Packs/day: 0.20    Years: 23.00    Pack years: 4.60    Types: Cigarettes    Last attempt to quit: 06/11/2017    Years since quitting: 0.5  . Smokeless tobacco: Never Used  Substance and Sexual Activity  . Alcohol use: No    Alcohol/week: 0.0 oz  . Drug use: No  . Sexual activity: Not on file  Lifestyle  . Physical activity:    Days per week: Not on file    Minutes per session:  Not on file  . Stress: Not on file  Relationships  . Social connections:    Talks on phone: Not on file    Gets together: Not on file    Attends religious service: Not on file    Active member of club or organization: Not on file    Attends meetings of clubs or organizations: Not on file    Relationship status: Not on file  . Intimate partner violence:    Fear of current or ex partner: Not on file    Emotionally abused: Not on file    Physically abused: Not on file    Forced sexual activity: Not on file  Other Topics Concern  . Not on file  Social History Narrative   Married.   Has custody of her grandson.   Works as a Programme researcher, broadcasting/film/video.   Also aspires to go to nursing school    Past Surgical History:  Procedure Laterality Date  .  BIOPSY BREAST Right   . HERNIA REPAIR      Family History  Adopted: Yes  Problem Relation Age of Onset  . Other Unknown        adopted    No Known Allergies  Current Outpatient Medications on File Prior to Visit  Medication Sig Dispense Refill  . albuterol (PROAIR HFA) 108 (90 Base) MCG/ACT inhaler Inhale 2 puffs into the lungs every 4 (four) hours as needed for wheezing or shortness of breath. 8.5 Inhaler 0  . calcium carbonate (TUMS - DOSED IN MG ELEMENTAL CALCIUM) 500 MG chewable tablet Chew 1 tablet by mouth daily.    . Cholecalciferol (VITAMIN D PO) Take 1 capsule by mouth daily.    . cyanocobalamin 500 MCG tablet Take 500 mcg by mouth daily.    Marland Kitchen ibuprofen (ADVIL,MOTRIN) 200 MG tablet Take 200 mg by mouth every 6 (six) hours as needed.     No current facility-administered medications on file prior to visit.     BP 114/80   Pulse 90   Temp 98 F (36.7 C) (Oral)   Wt 245 lb (111.1 kg)   LMP 11/10/2016   SpO2 97%   BMI 38.37 kg/m    Objective:   Physical Exam  Constitutional: She appears well-nourished.  HENT:  Right Ear: Tympanic membrane and ear canal normal.  Left Ear: Tympanic membrane and ear canal normal.  Nose: Right sinus exhibits no maxillary sinus tenderness and no frontal sinus tenderness. Left sinus exhibits no maxillary sinus tenderness and no frontal sinus tenderness.  Mouth/Throat: Oropharynx is clear and moist.  Eyes: Conjunctivae are normal.  Neck: Neck supple.  Cardiovascular: Normal rate and regular rhythm.  No lower extremity edema noted today  Pulmonary/Chest: Effort normal and breath sounds normal. She has no wheezes. She has no rales.  Lymphadenopathy:    She has no cervical adenopathy.  Skin: Skin is warm and dry.  Psychiatric: She has a normal mood and affect.          Assessment & Plan:

## 2017-12-28 NOTE — Assessment & Plan Note (Signed)
Doing well, using albuterol sparingly.

## 2017-12-28 NOTE — Assessment & Plan Note (Signed)
None noted on exam.  Suspect this to be secondary to her sedentary lifestyle and weight gain. Check labs today including A1C, TSH, CMP.  Recommended to continue regular exercise, become more mobile during the day, elevate legs when resting, compression socks.

## 2017-12-28 NOTE — Assessment & Plan Note (Signed)
Symptoms of hot flashes, irritability, mood swings, difficulty sleeping. Discussed options for treatment, will start with low dose Effexor once daily.  She will update through my chart in 4 weeks.  We discussed possible side effects of headache, GI upset, drowsiness, and SI/HI. If thoughts of SI/HI develop, we discussed to present to the emergency immediately. Patient verbalized understanding.

## 2017-12-28 NOTE — Patient Instructions (Signed)
Start venlafaxine ER 37.5 mg every morning for menopausal symptoms. Please update me in 3-4 weeks, or sooner if you have any problems.  Elevate your legs when sitting at your desk.   Continue exercising. You should be getting 150 minutes of moderate intensity exercise weekly.  Stop by the lab prior to leaving today. I will notify you of your results once received.   Increase fiber in your diet, or by trying metamucil.   It was a pleasure to see you today!   High-Fiber Diet Fiber, also called dietary fiber, is a type of carbohydrate found in fruits, vegetables, whole grains, and beans. A high-fiber diet can have many health benefits. Your health care provider may recommend a high-fiber diet to help:  Prevent constipation. Fiber can make your bowel movements more regular.  Lower your cholesterol.  Relieve hemorrhoids, uncomplicated diverticulosis, or irritable bowel syndrome.  Prevent overeating as part of a weight-loss plan.  Prevent heart disease, type 2 diabetes, and certain cancers.  What is my plan? The recommended daily intake of fiber includes:  38 grams for men under age 77.  70 grams for men over age 5.  2 grams for women under age 57.  46 grams for women over age 75.  You can get the recommended daily intake of dietary fiber by eating a variety of fruits, vegetables, grains, and beans. Your health care provider may also recommend a fiber supplement if it is not possible to get enough fiber through your diet. What do I need to know about a high-fiber diet?  Fiber supplements have not been widely studied for their effectiveness, so it is better to get fiber through food sources.  Always check the fiber content on thenutrition facts label of any prepackaged food. Look for foods that contain at least 5 grams of fiber per serving.  Ask your dietitian if you have questions about specific foods that are related to your condition, especially if those foods are not  listed in the following section.  Increase your daily fiber consumption gradually. Increasing your intake of dietary fiber too quickly may cause bloating, cramping, or gas.  Drink plenty of water. Water helps you to digest fiber. What foods can I eat? Grains Whole-grain breads. Multigrain cereal. Oats and oatmeal. Brown rice. Barley. Bulgur wheat. [place]. Bran muffins. Popcorn. Rye wafer crackers. Vegetables Sweet potatoes. Spinach. Kale. Artichokes. Cabbage. Broccoli. Green peas. Carrots. Squash. Fruits Berries. Pears. Apples. Oranges. Avocados. Prunes and raisins. Dried figs. Meats and Other Protein Sources [place], kidney, pinto, and soy beans. Split peas. Lentils. Nuts and seeds. Dairy Fiber-fortified yogurt. Beverages Fiber-fortified soy milk. Fiber-fortified orange juice. Other Fiber bars. The items listed above may not be a complete list of recommended foods or beverages. Contact your dietitian for more options. What foods are not recommended? Grains White bread. Pasta made with refined flour. White rice. Vegetables Fried potatoes. Canned vegetables. Well-cooked vegetables. Fruits Fruit juice. Cooked, strained fruit. Meats and Other Protein Sources Fatty cuts of meat. Fried Sales executive or fried fish. Dairy Milk. Yogurt. Cream cheese. Sour cream. Beverages Soft drinks. Other Cakes and pastries. Butter and oils. The items listed above may not be a complete list of foods and beverages to avoid. Contact your dietitian for more information. What are some tips for including high-fiber foods in my diet?  Eat a wide variety of high-fiber foods.  Make sure that half of all grains consumed each day are whole grains.  Replace breads and cereals made from refined flour or white  flour with whole-grain breads and cereals.  Replace white rice with brown rice, bulgur wheat, or millet.  Start the day with a breakfast that is high in fiber, such as a cereal that contains at least 5 grams  of fiber per serving.  Use beans in place of meat in soups, salads, or pasta.  Eat high-fiber snacks, such as berries, raw vegetables, nuts, or popcorn. This information is not intended to replace advice given to you by your health care provider. Make sure you discuss any questions you have with your health care provider. Document Released: 09/25/2005 Document Revised: 03/02/2016 Document Reviewed: 03/10/2014 Elsevier Interactive Patient Education  [employer].

## 2017-12-28 NOTE — Assessment & Plan Note (Signed)
Soft to liquid stools daily, then break for a 1-2 days.  Could be secondary to stress, weight gain, poor diet. Discussed to increase fiber through diet, or with metamucil.  She'll update.  Consider stool studies in the future if no improvement. Do not suspect infectious cause.

## 2017-12-28 NOTE — Assessment & Plan Note (Signed)
Prior A1C of 5.8, due for repeat A1C today. Discussed the importance of a healthy diet and regular exercise in order for weight loss, and to reduce the risk of any potential medical problems.

## 2018-01-01 ENCOUNTER — Ambulatory Visit: Payer: BLUE CROSS/BLUE SHIELD | Admitting: Primary Care

## 2018-01-01 ENCOUNTER — Encounter: Payer: Self-pay | Admitting: Primary Care

## 2018-01-01 DIAGNOSIS — R1084 Generalized abdominal pain: Secondary | ICD-10-CM

## 2018-01-02 ENCOUNTER — Ambulatory Visit: Payer: BLUE CROSS/BLUE SHIELD | Admitting: Family Medicine

## 2018-01-02 ENCOUNTER — Encounter: Payer: Self-pay | Admitting: Family Medicine

## 2018-01-02 ENCOUNTER — Encounter: Payer: Self-pay | Admitting: *Deleted

## 2018-01-02 VITALS — BP 136/82 | HR 104 | Temp 100.1°F | Wt 243.5 lb

## 2018-01-02 DIAGNOSIS — B9789 Other viral agents as the cause of diseases classified elsewhere: Secondary | ICD-10-CM | POA: Diagnosis not present

## 2018-01-02 DIAGNOSIS — M791 Myalgia, unspecified site: Secondary | ICD-10-CM | POA: Diagnosis not present

## 2018-01-02 DIAGNOSIS — J988 Other specified respiratory disorders: Secondary | ICD-10-CM

## 2018-01-02 DIAGNOSIS — R509 Fever, unspecified: Secondary | ICD-10-CM | POA: Diagnosis not present

## 2018-01-02 NOTE — Patient Instructions (Signed)
For nasal congestion you can use Afrin nasal spray for 3 days max, Sudafed, saline nasal spray (generic is fine for all). For cough you can try Delsym. Drink enough fluids to make your urine light yellow. For fever/chill/muscle aches you can take over the counter acetaminophen or ibuprofen.  Please come back in if you are not better in 5-7 days or if you develop wheezing, shortness of breath or persistent vomiting.   

## 2018-01-02 NOTE — Progress Notes (Signed)
Subjective:    Patient ID: Beverly Wright, female    DOB: 04-02-1971, 47 y.o.   MRN: [id number]  HPI This is a 47 yo female, accompanied by her school aged son, who presents today with 2 days of sudden onset myalgias, sore throat, headache, dry cough, fever to 100.2. She has taken some Mucinex with little relief. No wheeze, feels winded. Headache in left temple. Little nasal drainage, no ear pin. 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  No meds for fever or myalgias.   Past Medical History:  Diagnosis Date  . Attention deficit hyperactivity disorder (ADHD) 02/05/2015   dx age 31   . Back pain    Past Surgical History:  Procedure Laterality Date  . BIOPSY BREAST Right   . HERNIA REPAIR     Family History  Adopted: Yes  Problem Relation Age of Onset  . Other Unknown        adopted   Social History   Tobacco Use  . Smoking status: Former Smoker    Packs/day: 0.20    Years: 23.00    Pack years: 4.60    Types: Cigarettes    Last attempt to quit: 06/11/2017    Years since quitting: 0.5  . Smokeless tobacco: Never Used  Substance Use Topics  . Alcohol use: No    Alcohol/week: 0.0 oz  . Drug use: No   Medications, allergies, past medical history, surgical history, family history, social history and problem list reviewed and updated.  Review of Systems  Per HPI    Objective:   Physical Exam  Constitutional: She is oriented to person, place, and time. She appears well-developed and well-nourished. She appears ill. No distress.  HENT:  Head: Normocephalic and atraumatic.  Right Ear: Tympanic membrane, external ear and ear canal normal.  Left Ear: Tympanic membrane, external ear and ear canal normal.  Nose: No mucosal edema or rhinorrhea.  Mouth/Throat: Mucous membranes are normal. Posterior oropharyngeal erythema present. No oropharyngeal exudate, posterior oropharyngeal edema or tonsillar abscesses.  Eyes:  Conjunctivae are normal.  Neck: Normal range of motion. Neck supple.  Cardiovascular: Normal rate, regular rhythm and normal heart sounds.  Pulmonary/Chest: Effort normal and breath sounds normal. No respiratory distress. She has no wheezes. She has no rales.  Occasional dry cough witnessed.   Lymphadenopathy:    She has no cervical adenopathy.  Neurological: She is alert and oriented to person, place, and time.  Skin: Skin is warm and dry. She is not diaphoretic.  Psychiatric: She has a normal mood and affect. Her behavior is normal. Judgment and thought content normal.  Vitals reviewed.     BP 136/82   Pulse (!) 104   Temp 100.1 F (37.8 C) (Oral)   Wt 243 lb 8 oz (110.5 kg)   LMP 11/10/2016   SpO2 93%   BMI 38.14 kg/m  Wt Readings from Last 3 Encounters:  01/02/18 243 lb 8 oz (110.5 kg)  12/28/17 245 lb (111.1 kg)  08/10/17 220 lb (99.8 kg)  POCT influenza- negative    Assessment & Plan:  1. Fever and chills - patient given 600 mg ibuprofen in office  - POCT Influenza A/B  2. Myalgia - POCT Influenza A/B  3. Viral respiratory illness - negative influenza test - Provided written and verbal information regarding diagnosis and treatment. -  Patient Instructions  For nasal congestion you can use Afrin nasal spray for 3 days  max, Sudafed, saline nasal spray (generic is fine for all). For cough you can try Delsym. Drink enough fluids to make your urine light yellow. For fever/chill/muscle aches you can take over the counter acetaminophen or ibuprofen.  Please come back in if you are not better in 5-7 days or if you develop wheezing, shortness of breath or persistent vomiting.       Clarene Reamer, FNP-BC  [facility]  01/02/2018 8:21 PM

## 2018-01-03 LAB — POCT INFLUENZA A/B
Influenza A, POC: NEGATIVE
Influenza B, POC: NEGATIVE

## 2018-01-04 ENCOUNTER — Other Ambulatory Visit: Payer: Self-pay | Admitting: Primary Care

## 2018-01-04 DIAGNOSIS — R1010 Upper abdominal pain, unspecified: Secondary | ICD-10-CM

## 2018-01-04 DIAGNOSIS — R945 Abnormal results of liver function studies: Secondary | ICD-10-CM

## 2018-01-07 ENCOUNTER — Ambulatory Visit: Payer: Self-pay

## 2018-01-07 ENCOUNTER — Emergency Department (HOSPITAL_COMMUNITY): Payer: BLUE CROSS/BLUE SHIELD

## 2018-01-07 ENCOUNTER — Emergency Department (HOSPITAL_COMMUNITY)
Admission: EM | Admit: 2018-01-07 | Discharge: 2018-01-07 | Disposition: A | Discharge status: Home / Self Care | Payer: BLUE CROSS/BLUE SHIELD | Attending: Emergency Medicine | Admitting: Emergency Medicine

## 2018-01-07 ENCOUNTER — Encounter (HOSPITAL_COMMUNITY): Payer: Self-pay

## 2018-01-07 DIAGNOSIS — J40 Bronchitis, not specified as acute or chronic: Secondary | ICD-10-CM

## 2018-01-07 DIAGNOSIS — Z79899 Other long term (current) drug therapy: Secondary | ICD-10-CM | POA: Insufficient documentation

## 2018-01-07 DIAGNOSIS — Z87891 Personal history of nicotine dependence: Secondary | ICD-10-CM | POA: Insufficient documentation

## 2018-01-07 LAB — CBC WITH DIFFERENTIAL/PLATELET
Basophils Absolute: 0 10*3/uL (ref 0.0–0.1)
Basophils Relative: 0 %
EOS ABS: 0.4 10*3/uL (ref 0.0–0.7)
Eosinophils Relative: 6 %
HCT: 40.1 % (ref 36.0–46.0)
Hemoglobin: 13.2 g/dL (ref 12.0–15.0)
LYMPHS ABS: 2.4 10*3/uL (ref 0.7–4.0)
Lymphocytes Relative: 41 %
MCH: 29.6 pg (ref 26.0–34.0)
MCHC: 32.9 g/dL (ref 30.0–36.0)
MCV: 89.9 fL (ref 78.0–100.0)
Monocytes Absolute: 0.3 10*3/uL (ref 0.1–1.0)
Monocytes Relative: 6 %
Neutro Abs: 2.8 10*3/uL (ref 1.7–7.7)
Neutrophils Relative %: 47 %
Platelets: 261 10*3/uL (ref 150–400)
RBC: 4.46 MIL/uL (ref 3.87–5.11)
RDW: 13.6 % (ref 11.5–15.5)
WBC: 5.9 10*3/uL (ref 4.0–10.5)

## 2018-01-07 LAB — COMPREHENSIVE METABOLIC PANEL
ALT: 34 U/L (ref 14–54)
AST: 25 U/L (ref 15–41)
Albumin: 4 g/dL (ref 3.5–5.0)
Alkaline Phosphatase: 71 U/L (ref 38–126)
Anion gap: 12 (ref 5–15)
BUN: 17 mg/dL (ref 6–20)
CO2: 23 mmol/L (ref 22–32)
Calcium: 9 mg/dL (ref 8.9–10.3)
Chloride: 103 mmol/L (ref 101–111)
Creatinine, Ser: 0.73 mg/dL (ref 0.44–1.00)
GFR calc Af Amer: >60 mL/min (ref >60)
GFR calc non Af Amer: >60 mL/min (ref >60)
Glucose, Bld: 100 mg/dL — ABNORMAL HIGH (ref 65–99)
Potassium: 3.4 mmol/L — ABNORMAL LOW (ref 3.5–5.1)
Sodium: 138 mmol/L (ref 135–145)
Total Bilirubin: 0.5 mg/dL (ref 0.3–1.2)
Total Protein: 7.6 g/dL (ref 6.5–8.1)

## 2018-01-07 MED ORDER — METHYLPREDNISOLONE SODIUM SUCC 125 MG IJ SOLR
125.0000 mg | Freq: Once | INTRAMUSCULAR | Status: AC
  Administered 2018-01-07: 125 mg via INTRAVENOUS
  Filled 2018-01-07: qty 2

## 2018-01-07 MED ORDER — DOXYCYCLINE HYCLATE 100 MG PO TABS
100.0000 mg | ORAL_TABLET | Freq: Once | ORAL | Status: AC
  Administered 2018-01-07: 100 mg via ORAL
  Filled 2018-01-07: qty 1

## 2018-01-07 MED ORDER — IPRATROPIUM-ALBUTEROL 0.5-2.5 (3) MG/3ML IN SOLN
3.0000 mL | Freq: Once | RESPIRATORY_TRACT | Status: AC
  Administered 2018-01-07: 3 mL via RESPIRATORY_TRACT
  Filled 2018-01-07: qty 3

## 2018-01-07 MED ORDER — ALBUTEROL SULFATE (2.5 MG/3ML) 0.083% IN NEBU
2.5000 mg | INHALATION_SOLUTION | Freq: Once | RESPIRATORY_TRACT | Status: AC
  Administered 2018-01-07: 2.5 mg via RESPIRATORY_TRACT
  Filled 2018-01-07: qty 3

## 2018-01-07 MED ORDER — DOXYCYCLINE HYCLATE 100 MG PO CAPS: 100.0000 mg | ORAL_CAPSULE | Freq: Two times a day (BID) | ORAL | 0 refills | Status: DC

## 2018-01-07 MED ORDER — LORAZEPAM 2 MG/ML IJ SOLN
0.5000 mg | Freq: Once | INTRAMUSCULAR | Status: AC
  Administered 2018-01-07: 0.5 mg via INTRAVENOUS
  Filled 2018-01-07: qty 1

## 2018-01-07 MED ORDER — PREDNISONE 10 MG PO TABS: 20.0000 mg | ORAL_TABLET | Freq: Every day | ORAL | 0 refills | Status: DC

## 2018-01-07 NOTE — Telephone Encounter (Signed)
Phone call from pt.  Reported upper right chest tightness when she takes a deep breath.  Stated her Inhaler is not helping.  Reported her cough has worsened, and she can hear wheezing when she breathes. Coughing up "green mucus."  C/o increased shortness of breath.  Reported c/o intermittent fever; (last temp. on Sat. of 101.3), laryngitis, lower back pain, and diarrhea.  Reported 02 Sat of 93%.  Pt. Sounds very sick on the phone.  Advised to go to the ER, due to her worsening symptoms.  Pt. Stated her husband is with her, and will take her to [facility] ER, at this time.     Reason for Disposition . Difficulty breathing  Answer Assessment - Initial Assessment Questions 1. ONSET: "When did the cough begin?"      6 days ago 2. SEVERITY: "How bad is the cough today?"      Has worsened; sharp pain goes to head  3. RESPIRATORY DISTRESS: "Describe your breathing."      Hurts to take a deep a breath in upper right chest ; 02 Sats are 93% 4. FEVER: "Do you have a fever?" If so, ask: "What is your temperature, how was it measured, and when did it start?"    Saturday; 101.3 5. SPUTUM: "Describe the color of your sputum" (clear, white, yellow, green)     Green   6. HEMOPTYSIS: "Are you coughing up any blood?" If so ask: "How much?" (flecks, streaks, tablespoons, etc.)     No  7. CARDIAC HISTORY: "Do you have any history of heart disease?" (e.g., heart attack, congestive heart failure)      no 8. LUNG HISTORY: "Do you have any history of lung disease?"  (e.g., pulmonary embolus, asthma, emphysema)     Hx of asthma 9. PE RISK FACTORS: "Do you have a history of blood clots?" (or: recent major surgery, recent prolonged travel, bedridden )     n/a 10. OTHER SYMPTOMS: "Do you have any other symptoms?" (e.g., runny nose, wheezing, chest pain)      Cough with intermittent wheeze, shortness of breath, diarrhea, across lower back hurts,  11. PREGNANCY: "Is there any chance you are pregnant?" "When was your  last menstrual period?"       No; no periods since 11/2016 12. TRAVEL: "Have you traveled out of the country in the last month?" (e.g., travel history, exposures)       No  Protocols used: [place]

## 2018-01-07 NOTE — Telephone Encounter (Signed)
Noted, patient at [facility] ED now.

## 2018-01-07 NOTE — Discharge Instructions (Addendum)
Follow-up with your doctor next week for recheck 

## 2018-01-07 NOTE — ED Notes (Signed)
Gave EKG to Dr. Zammit 

## 2018-01-07 NOTE — ED Triage Notes (Signed)
Pt went to the doctor last week and was diagnosed with a virus. Pt has been getting progressively SOB since Tuesday. Pt's O2 level is 100% but breathing 25x's per minute. Expiratory wheezes.

## 2018-01-07 NOTE — ED Notes (Signed)
Pulse rate was mistakenly put in as 1, but corrected to 82

## 2018-01-07 NOTE — ED Notes (Signed)
Have paged respiratory  

## 2018-01-07 NOTE — ED Provider Notes (Signed)
[facility] EMERGENCY DEPARTMENT Provider Note   CSN: [id number] Arrival date & time: 01/07/18  1550     History   Chief Complaint Chief Complaint  Patient presents with  . Shortness of Breath    HPI Beverly Wright is a 47 y.o. female.  Patient states she has had a cough and congestion with yellow sputum production for a number days.  She has not been improving.  The history is provided by the patient.  Shortness of Breath  This is a recurrent problem. The problem occurs continuously.The current episode started more than 1 week ago. The problem has not changed since onset.Pertinent negatives include no fever, no headaches, no cough, no chest pain, no abdominal pain and no rash. The problem's precipitants include pollens. She has tried ipratropium inhalers for the symptoms. The treatment provided mild relief. She has had prior hospitalizations. Associated medical issues include asthma.    Past Medical History:  Diagnosis Date  . Attention deficit hyperactivity disorder (ADHD) 02/05/2015   dx age 89   . Back pain     Patient Active Problem List   Diagnosis Date Noted  . Lower extremity edema 12/28/2017  . Prediabetes 12/28/2017  . Vasomotor symptoms due to menopause 12/28/2017  . Change in consistency of stool 12/28/2017  . Facet arthropathy, lumbar 12/07/2016  . Spondylosis without myelopathy or radiculopathy, lumbar region 09/13/2016  . Lumbar discogenic pain syndrome 03/17/2016  . Preventative health care 03/01/2016  . DDD (degenerative disc disease), lumbar 02/10/2016  . Facet syndrome, lumbar 02/10/2016  . Lumbar radiculopathy 02/10/2016  . Asthma 11/25/2015  . Chronic low back pain with bilateral sciatica 09/29/2015  . Attention deficit hyperactivity disorder (ADHD) 02/05/2015    Past Surgical History:  Procedure Laterality Date  . BIOPSY BREAST Right   . HERNIA REPAIR       OB History    Gravida  5   Para  4   Term  3   Preterm  1   AB  1   Living  4     SAB  1   TAB      Ectopic      Multiple      Live Births               Home Medications    Prior to Admission medications   Medication Sig Start Date End Date Taking? Authorizing Provider  albuterol (PROAIR HFA) 108 (90 Base) MCG/ACT inhaler Inhale 2 puffs into the lungs every 4 (four) hours as needed for wheezing or shortness of breath. 12/13/17  Yes Pleas Koch, NP  Cholecalciferol (VITAMIN D PO) Take 1 capsule by mouth daily.   Yes [provider]  cyanocobalamin 500 MCG tablet Take 500 mcg by mouth daily.   Yes [provider]  ibuprofen (ADVIL,MOTRIN) 200 MG tablet Take 400-600 mg by mouth every 6 (six) hours as needed for mild pain or moderate pain.    Yes [provider]  venlafaxine XR (EFFEXOR XR) 37.5 MG 24 hr capsule Take 1 capsule (37.5 mg total) by mouth daily with breakfast. 12/28/17  Yes Pleas Koch, NP  doxycycline (VIBRAMYCIN) 100 MG capsule Take 1 capsule (100 mg total) by mouth 2 (two) times daily. One po bid x 7 days 01/07/18   Milton Ferguson, MD  predniSONE (DELTASONE) 10 MG tablet Take 2 tablets (20 mg total) by mouth daily. 01/07/18   Milton Ferguson, MD    Family History Family History  Adopted: Yes  Problem Relation Age of Onset  . Other Unknown        adopted    Social History Social History   Tobacco Use  . Smoking status: Former Smoker    Packs/day: 0.20    Years: 23.00    Pack years: 4.60    Types: Cigarettes    Last attempt to quit: 06/11/2017    Years since quitting: 0.5  . Smokeless tobacco: Never Used  Substance Use Topics  . Alcohol use: No    Alcohol/week: 0.0 oz  . Drug use: No     Allergies   Patient has no known allergies.   Review of Systems Review of Systems  Constitutional: Negative for appetite change, fatigue and fever.  HENT: Negative for congestion, ear discharge and sinus pressure.   Eyes: Negative for discharge.  Respiratory: Positive for shortness of breath.  Negative for cough.   Cardiovascular: Negative for chest pain.  Gastrointestinal: Negative for abdominal pain and diarrhea.  Genitourinary: Negative for frequency and hematuria.  Musculoskeletal: Negative for back pain.  Skin: Negative for rash.  Neurological: Negative for seizures and headaches.  Psychiatric/Behavioral: Negative for hallucinations.     Physical Exam Updated Vital Signs BP (!) 131/99 (BP Location: Left Arm)   Pulse 78   Temp (!) 97.5 F (36.4 C) (Oral)   Resp 14   Ht 5\' 7"  (1.702 m)   Wt 108.9 kg (240 lb)   LMP 11/10/2016   SpO2 99%   BMI 37.59 kg/m   Physical Exam  Constitutional: She is oriented to person, place, and time. She appears well-developed.  HENT:  Head: Normocephalic.  Eyes: Conjunctivae and EOM are normal. No scleral icterus.  Neck: Neck supple. No thyromegaly present.  Cardiovascular: Normal rate and regular rhythm. Exam reveals no gallop and no friction rub.  No murmur heard. Pulmonary/Chest: No stridor. She has wheezes. She has no rales. She exhibits no tenderness.  Abdominal: She exhibits no distension. There is no tenderness. There is no rebound.  Musculoskeletal: Normal range of motion. She exhibits no edema.  Lymphadenopathy:    She has no cervical adenopathy.  Neurological: She is oriented to person, place, and time. She exhibits normal muscle tone. Coordination normal.  Skin: No rash noted. No erythema.  Psychiatric: She has a normal mood and affect. Her behavior is normal.     ED Treatments / Results  Labs (all labs ordered are listed, but only abnormal results are displayed) Labs Reviewed  COMPREHENSIVE METABOLIC PANEL - Abnormal; Notable for the following components:      Result Value   Potassium 3.4 (*)    Glucose, Bld 100 (*)    All other components within normal limits  CBC WITH DIFFERENTIAL/PLATELET    EKG None  Radiology Dg Chest 2 View  Result Date: 01/07/2018 CLINICAL DATA:  Shortness of breath and fever  for the past week. EXAM: CHEST - 2 VIEW COMPARISON:  None. FINDINGS: The heart size and mediastinal contours are within normal limits. Both lungs are clear. The visualized skeletal structures are unremarkable. IMPRESSION: No active cardiopulmonary disease. Electronically Signed   By: Titus Dubin M.D.   On: 01/07/2018 17:05    Procedures Procedures (including critical care time)  Medications Ordered in ED Medications  doxycycline (VIBRA-TABS) tablet 100 mg (has no administration in time range)  albuterol (PROVENTIL) (2.5 MG/3ML) 0.083% nebulizer solution 2.5 mg (2.5 mg Nebulization Given 01/07/18 1626)  methylPREDNISolone sodium succinate (SOLU-MEDROL) 125 mg/2 mL injection 125 mg (125 mg Intravenous Given  01/07/18 2020)  ipratropium-albuterol (DUONEB) 0.5-2.5 (3) MG/3ML nebulizer solution 3 mL (3 mLs Nebulization Given 01/07/18 2054)  albuterol (PROVENTIL) (2.5 MG/3ML) 0.083% nebulizer solution 2.5 mg (2.5 mg Nebulization Given 01/07/18 2054)  LORazepam (ATIVAN) injection 0.5 mg (0.5 mg Intravenous Given 01/07/18 2020)     Initial Impression / Assessment and Plan / ED Course  I have reviewed the triage vital signs and the nursing notes.  Pertinent labs & imaging results that were available during my care of the patient were reviewed by me and considered in my medical decision making (see chart for details).     Patient has bronchitis with bronchospasm.  Patient will be given doxycycline and prednisone and will continue her albuterol inhaler and follow-up with her PCP.  Labs and x-rays reviewed  Final Clinical Impressions(s) / ED Diagnoses   Final diagnoses:  Bronchitis    ED Discharge Orders        Ordered    predniSONE (DELTASONE) 10 MG tablet  Daily     01/07/18 2125    doxycycline (VIBRAMYCIN) 100 MG capsule  2 times daily     01/07/18 2125       Milton Ferguson, MD 01/07/18 2127

## 2018-01-08 ENCOUNTER — Ambulatory Visit: Payer: BLUE CROSS/BLUE SHIELD | Admitting: Family Medicine

## 2018-01-08 DIAGNOSIS — Z0289 Encounter for other administrative examinations: Secondary | ICD-10-CM

## 2018-01-11 ENCOUNTER — Encounter: Payer: Self-pay | Admitting: Primary Care

## 2018-01-11 ENCOUNTER — Ambulatory Visit
Admission: RE | Admit: 2018-01-11 | Discharge: 2018-01-11 | Disposition: A | Discharge status: Home / Self Care | Payer: BLUE CROSS/BLUE SHIELD | Source: Ambulatory Visit | Attending: Primary Care | Admitting: Primary Care

## 2018-01-11 DIAGNOSIS — R945 Abnormal results of liver function studies: Secondary | ICD-10-CM | POA: Diagnosis not present

## 2018-01-11 DIAGNOSIS — R1010 Upper abdominal pain, unspecified: Secondary | ICD-10-CM | POA: Diagnosis present

## 2018-01-11 DIAGNOSIS — R932 Abnormal findings on diagnostic imaging of liver and biliary tract: Secondary | ICD-10-CM | POA: Diagnosis not present

## 2018-01-11 DIAGNOSIS — J4540 Moderate persistent asthma, uncomplicated: Secondary | ICD-10-CM

## 2018-01-11 HISTORY — DX: Unspecified asthma, uncomplicated: J45.909

## 2018-01-11 MED ORDER — IOPAMIDOL (ISOVUE-300) INJECTION 61%
100.0000 mL | Freq: Once | INTRAVENOUS | Status: AC | PRN
  Administered 2018-01-11: 100 mL via INTRAVENOUS

## 2018-01-13 MED ORDER — FLUTICASONE PROPIONATE HFA 44 MCG/ACT IN AERO: 2.0000 | INHALATION_SPRAY | Freq: Two times a day (BID) | RESPIRATORY_TRACT | 3 refills | Status: DC

## 2018-02-11 ENCOUNTER — Other Ambulatory Visit: Payer: Self-pay | Admitting: Primary Care

## 2018-02-11 DIAGNOSIS — J4520 Mild intermittent asthma, uncomplicated: Secondary | ICD-10-CM

## 2018-02-11 NOTE — Telephone Encounter (Signed)
How often is she using her albuterol?  Thanks.

## 2018-02-11 NOTE — Telephone Encounter (Signed)
Electronic refill request Last office visit 01/02/18/acute Last refill 12/13/17

## 2018-02-12 NOTE — Telephone Encounter (Signed)
Spoke to patient and was advised that she is using this daily since all of the pollen has been around. Patient stated that she was not able to get the flovent inhaler because it was going to cost her $142 and that is too expensive. Patient stated that since she has been sick she has been having to use it more also.

## 2018-02-12 NOTE — Telephone Encounter (Signed)
Noted. Okay to use for now during pollen season and illness but if she continues to require use of the inhaler more than three days weekly after she recovers then we'll need to consider an alternative.

## 2018-02-12 NOTE — Telephone Encounter (Signed)
Patient was notified in person.

## 2018-03-27 ENCOUNTER — Other Ambulatory Visit: Payer: Self-pay | Admitting: Primary Care

## 2018-03-27 DIAGNOSIS — N951 Menopausal and female climacteric states: Secondary | ICD-10-CM

## 2018-04-16 ENCOUNTER — Other Ambulatory Visit: Payer: Self-pay | Admitting: Primary Care

## 2018-04-16 DIAGNOSIS — J4520 Mild intermittent asthma, uncomplicated: Secondary | ICD-10-CM

## 2018-06-27 ENCOUNTER — Other Ambulatory Visit: Payer: Self-pay | Admitting: Primary Care

## 2018-06-27 DIAGNOSIS — N951 Menopausal and female climacteric states: Secondary | ICD-10-CM

## 2018-08-20 NOTE — Telephone Encounter (Signed)
See 02/23/18 note; tramadol was prescribed.

## 2018-12-31 DIAGNOSIS — J4520 Mild intermittent asthma, uncomplicated: Secondary | ICD-10-CM

## 2018-12-31 MED ORDER — ALBUTEROL SULFATE HFA 108 (90 BASE) MCG/ACT IN AERS: INHALATION_SPRAY | RESPIRATORY_TRACT | 0 refills | Status: DC

## 2018-12-31 NOTE — Telephone Encounter (Signed)
Web ex or phone. Looks like they could do WebEx if available. Thanks!

## 2019-01-24 NOTE — Telephone Encounter (Signed)
Virtual visit.  Will you respond back to patient? Thanks.

## 2019-01-27 ENCOUNTER — Ambulatory Visit (INDEPENDENT_AMBULATORY_CARE_PROVIDER_SITE_OTHER): Payer: Self-pay | Admitting: Primary Care

## 2019-01-27 DIAGNOSIS — R609 Edema, unspecified: Secondary | ICD-10-CM

## 2019-01-27 DIAGNOSIS — R600 Localized edema: Secondary | ICD-10-CM

## 2019-01-27 DIAGNOSIS — R7303 Prediabetes: Secondary | ICD-10-CM

## 2019-01-27 NOTE — Assessment & Plan Note (Signed)
Unclear etiology, video failed so I could not assess her body. Check labs today including TSH, CMP, BNP, A1C. Recommended to reduce water intake to around 64 ounces daily. Also recommended concentrated exercise in order to get heart rate up. Elevate extremities.

## 2019-01-27 NOTE — Progress Notes (Signed)
Subjective:    Patient ID: Beverly Wright, female    DOB: Jul 08, 1971, 48 y.o.   MRN: [id number]  HPI  Virtual Visit via Video Note  I connected with Beverly Wright on 01/27/19 at  2:40 PM EDT by a video enabled telemedicine application and verified that I am speaking with the correct person using two identifiers.   I discussed the limitations of evaluation and management by telemedicine and the availability of in person appointments. The patient expressed understanding and agreed to proceed. She is at home, I am in the office.  Video had difficulty connecting so it ultimately failed. We had to complete our visit via phone.  History of Present Illness:  Beverly Wright is a 48 year old female with a history of asthma, chronic lower back pain, lower extremity edema who presents today with a chief complaint of "fluid retention". She's noticed the retention from "head to toe" but mostly to her hands, wrists, feet, ankles, knees, antecubital fossa. She feels like her skin has blown up like a balloon. She is drinking 96 ounces of water daily at minimum most days. She's recently been doing a lot of yard work outdoors and noticed swelling during yard work. She thinks she's lost some weight as some of her clothes were fitting more loosely.  She's been taking Biotin over the last one month, but nothing else new. She denies new foods and does not add salt to her food. She avoids canned and frozen foods for the most part.   Wt Readings from Last 3 Encounters:  01/07/18 240 lb (108.9 kg)  01/02/18 243 lb 8 oz (110.5 kg)  12/28/17 245 lb (111.1 kg)    BP Readings from Last 3 Encounters:  01/07/18 116/84  01/02/18 136/82  12/28/17 114/80     Observations/Objective:  Speaking in complete sentences. Alert and oriented. No distress.  Assessment and Plan:  Unclear etiology, video failed so I could not assess her body. Check labs today including TSH, CMP, BNP, A1C. Recommended to reduce  water intake to around 64 ounces daily. Also recommended concentrated exercise in order to get heart rate up. Elevate extremities.   Follow Up Instructions:  We will contact you regarding your lab appointment.  Reduce water intake to 64 ounces daily.  Elevate your legs when resting.  Start exercising. You should be getting 150 minutes of moderate intensity exercise weekly.  I'll be in touch once I receive your lab results. It was a pleasure to see you today! Allie Bossier, NP-C    I discussed the assessment and treatment plan with the patient. The patient was provided an opportunity to ask questions and all were answered. The patient agreed with the plan and demonstrated an understanding of the instructions.   The patient was advised to call back or seek an in-person evaluation if the symptoms worsen or if the condition fails to improve as anticipated.    Pleas Koch, NP    Review of Systems  Constitutional: Negative for fever.       Generalized body swelling/edema  Respiratory: Negative for shortness of breath.   Cardiovascular: Positive for leg swelling. Negative for chest pain.       Past Medical History:  Diagnosis Date  . Asthma   . Attention deficit hyperactivity disorder (ADHD) 02/05/2015   dx age 49   . Back pain      Social History   Socioeconomic History  . Marital status: Married    Spouse name: Not on  file  . Number of children: Not on file  . Years of education: Not on file  . Highest education level: Not on file  Occupational History  . Not on file  Social Needs  . Financial resource strain: Not on file  . Food insecurity:    Worry: Not on file    Inability: Not on file  . Transportation needs:    Medical: Not on file    Non-medical: Not on file  Tobacco Use  . Smoking status: Former Smoker    Packs/day: 0.20    Years: 23.00    Pack years: 4.60    Types: Cigarettes    Last attempt to quit: 06/11/2017    Years since quitting: 1.6  .  Smokeless tobacco: Never Used  Substance and Sexual Activity  . Alcohol use: No    Alcohol/week: 0.0 standard drinks  . Drug use: No  . Sexual activity: Not on file  Lifestyle  . Physical activity:    Days per week: Not on file    Minutes per session: Not on file  . Stress: Not on file  Relationships  . Social connections:    Talks on phone: Not on file    Gets together: Not on file    Attends religious service: Not on file    Active member of club or organization: Not on file    Attends meetings of clubs or organizations: Not on file    Relationship status: Not on file  . Intimate partner violence:    Fear of current or ex partner: Not on file    Emotionally abused: Not on file    Physically abused: Not on file    Forced sexual activity: Not on file  Other Topics Concern  . Not on file  Social History Narrative   Married.   Has custody of her grandson.   Works as a Programme researcher, broadcasting/film/video.   Also aspires to go to nursing school    Past Surgical History:  Procedure Laterality Date  . BIOPSY BREAST Right   . HERNIA REPAIR      Family History  Adopted: Yes  Problem Relation Age of Onset  . Other Unknown        adopted    No Known Allergies  Current Outpatient Medications on File Prior to Visit  Medication Sig Dispense Refill  . albuterol (PROAIR HFA) 108 (90 Base) MCG/ACT inhaler INHALE 2 PUFFS INTO THE LUNGS EVERY 4 (FOUR) HOURS AS NEEDED FOR WHEEZING OR SHORTNESS OF BREATH. 8.5 Inhaler 0  . Cholecalciferol (VITAMIN D PO) Take 1 capsule by mouth daily.    . cyanocobalamin 500 MCG tablet Take 500 mcg by mouth daily.    . fluticasone (FLOVENT HFA) 44 MCG/ACT inhaler Inhale 2 puffs into the lungs 2 (two) times daily. 1 Inhaler 3  . ibuprofen (ADVIL,MOTRIN) 200 MG tablet Take 400-600 mg by mouth every 6 (six) hours as needed for mild pain or moderate pain.     Marland Kitchen venlafaxine XR (EFFEXOR-XR) 37.5 MG 24 hr capsule TAKE 1 CAPSULE (37.5 MG TOTAL) BY MOUTH DAILY WITH BREAKFAST. 90 capsule  0   No current facility-administered medications on file prior to visit.     LMP 11/10/2016    Objective:   Physical Exam  Constitutional: She is oriented to person, place, and time.  Respiratory: Effort normal. No respiratory distress.  Neurological: She is alert and oriented to person, place, and time.  Psychiatric: She has a normal mood and affect.  Assessment & Plan:

## 2019-01-27 NOTE — Patient Instructions (Signed)
We will contact you regarding your lab appointment.  Reduce water intake to 64 ounces daily.  Elevate your legs when resting.  Start exercising. You should be getting 150 minutes of moderate intensity exercise weekly.  I'll be in touch once I receive your lab results. It was a pleasure to see you today! Allie Bossier, NP-C

## 2019-01-29 ENCOUNTER — Telehealth: Payer: Self-pay

## 2019-01-29 NOTE — Telephone Encounter (Signed)
Left VM to call clinic, needs COVID screen and curbside lab info

## 2019-01-30 ENCOUNTER — Other Ambulatory Visit (INDEPENDENT_AMBULATORY_CARE_PROVIDER_SITE_OTHER): Payer: Self-pay

## 2019-01-30 ENCOUNTER — Other Ambulatory Visit: Payer: Self-pay

## 2019-01-30 DIAGNOSIS — R600 Localized edema: Secondary | ICD-10-CM

## 2019-01-30 DIAGNOSIS — R7303 Prediabetes: Secondary | ICD-10-CM

## 2019-01-30 LAB — COMPREHENSIVE METABOLIC PANEL
ALT: 27 U/L (ref 0–35)
AST: 21 U/L (ref 0–37)
Albumin: 4.3 g/dL (ref 3.5–5.2)
Alkaline Phosphatase: 68 U/L (ref 39–117)
BUN: 20 mg/dL (ref 6–23)
CO2: 29 mEq/L (ref 19–32)
Calcium: 9.3 mg/dL (ref 8.4–10.5)
Chloride: 102 mEq/L (ref 96–112)
Creatinine, Ser: 0.8 mg/dL (ref 0.40–1.20)
GFR: 76.59 mL/min (ref >60.00)
Glucose, Bld: 114 mg/dL — ABNORMAL HIGH (ref 70–99)
Potassium: 4.2 mEq/L (ref 3.5–5.1)
Sodium: 139 mEq/L (ref 135–145)
Total Bilirubin: 0.4 mg/dL (ref 0.2–1.2)
Total Protein: 7.1 g/dL (ref 6.0–8.3)

## 2019-01-30 LAB — TSH: TSH: 3.49 u[IU]/mL (ref 0.35–4.50)

## 2019-01-30 LAB — BRAIN NATRIURETIC PEPTIDE: Pro B Natriuretic peptide (BNP): 9 pg/mL (ref 0.0–100.0)

## 2019-01-30 LAB — HEMOGLOBIN A1C: Hgb A1c MFr Bld: 5.7 % (ref 4.6–6.5)

## 2019-02-17 ENCOUNTER — Other Ambulatory Visit: Payer: Self-pay

## 2019-02-17 DIAGNOSIS — J4520 Mild intermittent asthma, uncomplicated: Secondary | ICD-10-CM

## 2019-02-18 MED ORDER — ALBUTEROL SULFATE HFA 108 (90 BASE) MCG/ACT IN AERS: INHALATION_SPRAY | RESPIRATORY_TRACT | 0 refills | Status: DC

## 2019-04-07 ENCOUNTER — Other Ambulatory Visit: Payer: Self-pay

## 2019-04-07 DIAGNOSIS — J4520 Mild intermittent asthma, uncomplicated: Secondary | ICD-10-CM

## 2019-04-09 DIAGNOSIS — J4520 Mild intermittent asthma, uncomplicated: Secondary | ICD-10-CM

## 2019-04-09 MED ORDER — ALBUTEROL SULFATE HFA 108 (90 BASE) MCG/ACT IN AERS: INHALATION_SPRAY | RESPIRATORY_TRACT | 0 refills | Status: DC

## 2019-04-22 ENCOUNTER — Other Ambulatory Visit: Payer: Self-pay | Admitting: Primary Care

## 2019-04-22 ENCOUNTER — Telehealth: Payer: Self-pay | Admitting: Primary Care

## 2019-04-22 DIAGNOSIS — R7303 Prediabetes: Secondary | ICD-10-CM

## 2019-04-22 NOTE — Telephone Encounter (Signed)
Spoken to patient and notified that this is okay.

## 2019-04-22 NOTE — Telephone Encounter (Signed)
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 She is wondering if she could have it done at her physical on 7/24 and come back first thing that next Monday morning to have it read.

## 2019-04-25 ENCOUNTER — Telehealth: Payer: Self-pay

## 2019-04-25 NOTE — Telephone Encounter (Signed)
Left message to call clinic, needs COVID screen and back door lab info   

## 2019-04-29 ENCOUNTER — Other Ambulatory Visit (INDEPENDENT_AMBULATORY_CARE_PROVIDER_SITE_OTHER): Payer: Self-pay

## 2019-04-29 DIAGNOSIS — R7303 Prediabetes: Secondary | ICD-10-CM

## 2019-04-29 LAB — LIPID PANEL
Cholesterol: 189 mg/dL (ref 0–200)
HDL: 66.4 mg/dL (ref >39.00)
LDL Cholesterol: 102 mg/dL — ABNORMAL HIGH (ref 0–99)
NonHDL: 122.7
Total CHOL/HDL Ratio: 3
Triglycerides: 105 mg/dL (ref 0.0–149.0)
VLDL: 21 mg/dL (ref 0.0–40.0)

## 2019-04-29 LAB — HEMOGLOBIN A1C: Hgb A1c MFr Bld: 5.9 % (ref 4.6–6.5)

## 2019-05-02 ENCOUNTER — Ambulatory Visit (INDEPENDENT_AMBULATORY_CARE_PROVIDER_SITE_OTHER): Payer: PRIVATE HEALTH INSURANCE | Admitting: Primary Care

## 2019-05-02 ENCOUNTER — Encounter: Payer: Self-pay | Admitting: Primary Care

## 2019-05-02 ENCOUNTER — Other Ambulatory Visit: Payer: Self-pay

## 2019-05-02 VITALS — BP 124/80 | HR 91 | Temp 97.7°F | Ht 66.0 in | Wt 238.5 lb

## 2019-05-02 DIAGNOSIS — J4520 Mild intermittent asthma, uncomplicated: Secondary | ICD-10-CM

## 2019-05-02 DIAGNOSIS — R609 Edema, unspecified: Secondary | ICD-10-CM

## 2019-05-02 DIAGNOSIS — J4540 Moderate persistent asthma, uncomplicated: Secondary | ICD-10-CM

## 2019-05-02 DIAGNOSIS — Z111 Encounter for screening for respiratory tuberculosis: Secondary | ICD-10-CM

## 2019-05-02 DIAGNOSIS — N951 Menopausal and female climacteric states: Secondary | ICD-10-CM

## 2019-05-02 DIAGNOSIS — R7303 Prediabetes: Secondary | ICD-10-CM

## 2019-05-02 DIAGNOSIS — Z0000 Encounter for general adult medical examination without abnormal findings: Secondary | ICD-10-CM

## 2019-05-02 DIAGNOSIS — R600 Localized edema: Secondary | ICD-10-CM

## 2019-05-02 LAB — POC URINALSYSI DIPSTICK (AUTOMATED)
Bilirubin, UA: NEGATIVE
Blood, UA: NEGATIVE
Glucose, UA: NEGATIVE
Ketones, UA: NEGATIVE
Leukocytes, UA: NEGATIVE
Nitrite, UA: NEGATIVE
Protein, UA: NEGATIVE
Spec Grav, UA: 1.01 (ref 1.010–1.025)
Urobilinogen, UA: 0.2 E.U./dL
pH, UA: 6 (ref 5.0–8.0)

## 2019-05-02 MED ORDER — FLOVENT HFA 44 MCG/ACT IN AERO: 2.0000 | INHALATION_SPRAY | Freq: Two times a day (BID) | RESPIRATORY_TRACT | 5 refills | Status: DC

## 2019-05-02 MED ORDER — VENLAFAXINE HCL ER 37.5 MG PO CP24: 37.5000 mg | ORAL_CAPSULE | Freq: Every day | ORAL | 1 refills | Status: DC

## 2019-05-02 NOTE — Assessment & Plan Note (Signed)
Daily shortness of breath with chest tightness, using albuterol inhaler twice daily. No use of Flovent in a long time.  Discussed that albuterol isn't to be used more that three times weekly on average and that it is not effect treatment for daily symptoms.  Rx of Flovent refilled and sent to pharmacy, discussed to use this daily and albuterol PRN. She will update.

## 2019-05-02 NOTE — Assessment & Plan Note (Signed)
Recent A1C stable, encouraged weight loss with diet and exercise.

## 2019-05-02 NOTE — Patient Instructions (Signed)
Stop by the lab prior to leaving today. I will notify you of your results once received.   Resume venlafaxine ER 37.5 mg daily for hot flashes.  Start fluticasone (Flovent) inhaler daily for asthma. Inhale 2 puffs into the lungs twice daily.  Use the albuterol inhaler only as needed for breakthrough shortness of breath.   Continue exercising. You should be getting 150 minutes of moderate intensity exercise weekly.  Work on increasing vegetables, fruit, lean protein.   Ensure you are consuming 64 ounces of water daily.  It was a pleasure to see you today!   Preventive Care 86-48 Years Old, Female Preventive care refers to visits with your health care provider and lifestyle choices that can promote health and wellness. This includes:  A yearly physical exam. This may also be called an annual well check.  Regular dental visits and eye exams.  Immunizations.  Screening for certain conditions.  Healthy lifestyle choices, such as eating a healthy diet, getting regular exercise, not using drugs or products that contain nicotine and tobacco, and limiting alcohol use. What can I expect for my preventive care visit? Physical exam Your health care provider will check your:  Height and weight. This may be used to calculate body mass index (BMI), which tells if you are at a healthy weight.  Heart rate and blood pressure.  Skin for abnormal spots. Counseling Your health care provider may ask you questions about your:  Alcohol, tobacco, and drug use.  Emotional well-being.  Home and relationship well-being.  Sexual activity.  Eating habits.  Work and work Statistician.  Method of birth control.  Menstrual cycle.  Pregnancy history. What immunizations do I need?  Influenza (flu) vaccine  This is recommended every year. Tetanus, diphtheria, and pertussis (Tdap) vaccine  You may need a Td booster every 10 years. Varicella (chickenpox) vaccine  You may need this if you  have not been vaccinated. Zoster (shingles) vaccine  You may need this after age 48. Measles, mumps, and rubella (MMR) vaccine  You may need at least one dose of MMR if you were born in 1957 or later. You may also need a second dose. Pneumococcal conjugate (PCV13) vaccine  You may need this if you have certain conditions and were not previously vaccinated. Pneumococcal polysaccharide (PPSV23) vaccine  You may need one or two doses if you smoke cigarettes or if you have certain conditions. Meningococcal conjugate (MenACWY) vaccine  You may need this if you have certain conditions. Hepatitis A vaccine  You may need this if you have certain conditions or if you travel or work in places where you may be exposed to hepatitis A. Hepatitis B vaccine  You may need this if you have certain conditions or if you travel or work in places where you may be exposed to hepatitis B. Haemophilus influenzae type b (Hib) vaccine  You may need this if you have certain conditions. Human papillomavirus (HPV) vaccine  If recommended by your health care provider, you may need three doses over 6 months. You may receive vaccines as individual doses or as more than one vaccine together in one shot (combination vaccines). Talk with your health care provider about the risks and benefits of combination vaccines. What tests do I need? Blood tests  Lipid and cholesterol levels. These may be checked every 5 years, or more frequently if you are over 48 years old.  Hepatitis C test.  Hepatitis B test. Screening  Lung cancer screening. You may have this screening  every year starting at age 48 if you have a 30-pack-year history of smoking and currently smoke or have quit within the past 15 years.  Colorectal cancer screening. All adults should have this screening starting at age 48 and continuing until age 48. Your health care provider may recommend screening at age 19 if you are at increased risk. You will have  tests every 1-10 years, depending on your results and the type of screening test.  Diabetes screening. This is done by checking your blood sugar (glucose) after you have not eaten for a while (fasting). You may have this done every 1-3 years.  Mammogram. This may be done every 1-2 years. Talk with your health care provider about when you should start having regular mammograms. This may depend on whether you have a family history of breast cancer.  BRCA-related cancer screening. This may be done if you have a family history of breast, ovarian, tubal, or peritoneal cancers.  Pelvic exam and Pap test. This may be done every 3 years starting at age 48. Starting at age 48, this may be done every 5 years if you have a Pap test in combination with an HPV test. Other tests  Sexually transmitted disease (STD) testing.  Bone density scan. This is done to screen for osteoporosis. You may have this scan if you are at high risk for osteoporosis. Follow these instructions at home: Eating and drinking  Eat a diet that includes fresh fruits and vegetables, whole grains, lean protein, and low-fat dairy.  Take vitamin and mineral supplements as recommended by your health care provider.  Do not drink alcohol if: ? Your health care provider tells you not to drink. ? You are pregnant, may be pregnant, or are planning to become pregnant.  If you drink alcohol: ? Limit how much you have to 0-1 drink a day. ? Be aware of how much alcohol is in your drink. In the [place] one drink equals one 12 oz bottle of beer (355 mL), one 5 oz glass of wine (148 mL), or one 1 oz glass of hard liquor (44 mL). Lifestyle  Take daily care of your teeth and gums.  Stay active. Exercise for at least 30 minutes on 5 or more days each week.  Do not use any products that contain nicotine or tobacco, such as cigarettes, e-cigarettes, and chewing tobacco. If you need help quitting, ask your health care provider.  If you are  sexually active, practice safe sex. Use a condom or other form of birth control (contraception) in order to prevent pregnancy and STIs (sexually transmitted infections).  If told by your health care provider, take low-dose aspirin daily starting at age 13. What's next?  Visit your health care provider once a year for a well check visit.  Ask your health care provider how often you should have your eyes and teeth checked.  Stay up to date on all vaccines. This information is not intended to replace advice given to you by your health care provider. Make sure you discuss any questions you have with your health care provider. Document Released: 10/22/2015 Document Revised: 06/06/2018 Document Reviewed: 06/06/2018 Elsevier Patient Education  2020 [employer].

## 2019-05-02 NOTE — Assessment & Plan Note (Signed)
Improved with dietary changes. Continue to monitor.

## 2019-05-02 NOTE — Progress Notes (Signed)
Subjective:    Patient ID: Beverly Wright, female    DOB: October 20, 1970, 48 y.o.   MRN: [id number]  HPI  Beverly Wright is a 48 year old female who presents today for complete physical.  Immunizations: -Tetanus: Completed in 2016 -Influenza: Due this season  Diet: She endorses a healthy diet with lean protein, green beans, starch. Desserts 1-2 times weekly. She is mostly drinking water, keto shakes, milk, body armour light electrolyte drink Exercise: She is active with projects at home, active with dogs  Eye exam: Completed in 2020 Dental exam: Completes semi-annually  Pap Smear: Completed in 2018 Mammogram: No recent exam  BP Readings from Last 3 Encounters:  05/02/19 124/80  01/07/18 116/84  01/02/18 136/82   Wt Readings from Last 3 Encounters:  05/02/19 238 lb 8 oz (108.2 kg)  01/07/18 240 lb (108.9 kg)  01/02/18 243 lb 8 oz (110.5 kg)     Review of Systems  Constitutional: Negative for unexpected weight change.  HENT: Negative for rhinorrhea.   Respiratory: Negative for cough.        Daily mild SOB  Cardiovascular: Negative for chest pain.  Gastrointestinal: Negative for constipation and diarrhea.  Genitourinary: Negative for difficulty urinating.       Hot flashes nightly on average, has been off of Effexor for quite some time.   Musculoskeletal: Negative for arthralgias and myalgias.  Skin: Negative for rash.  Allergic/Immunologic: Negative for environmental allergies.  Neurological: Negative for dizziness, numbness and headaches.  Psychiatric/Behavioral: The patient is not nervous/anxious.        Past Medical History:  Diagnosis Date  . Asthma   . Attention deficit hyperactivity disorder (ADHD) 02/05/2015   dx age 32   . Back pain      Social History   Socioeconomic History  . Marital status: Married    Spouse name: Not on file  . Number of children: Not on file  . Years of education: Not on file  . Highest education level: Not on file   Occupational History  . Not on file  Social Needs  . Financial resource strain: Not on file  . Food insecurity    Worry: Not on file    Inability: Not on file  . Transportation needs    Medical: Not on file    Non-medical: Not on file  Tobacco Use  . Smoking status: Former Smoker    Packs/day: 0.20    Years: 23.00    Pack years: 4.60    Types: Cigarettes    Quit date: 06/11/2017    Years since quitting: 1.8  . Smokeless tobacco: Never Used  Substance and Sexual Activity  . Alcohol use: No    Alcohol/week: 0.0 standard drinks  . Drug use: No  . Sexual activity: Not on file  Lifestyle  . Physical activity    Days per week: Not on file    Minutes per session: Not on file  . Stress: Not on file  Relationships  . Social Herbalist on phone: Not on file    Gets together: Not on file    Attends religious service: Not on file    Active member of club or organization: Not on file    Attends meetings of clubs or organizations: Not on file    Relationship status: Not on file  . Intimate partner violence    Fear of current or ex partner: Not on file    Emotionally abused: Not on file  Physically abused: Not on file    Forced sexual activity: Not on file  Other Topics Concern  . Not on file  Social History Narrative   Married.   Has custody of her grandson.   Works as a Programme researcher, broadcasting/film/video.   Also aspires to go to nursing school    Past Surgical History:  Procedure Laterality Date  . BIOPSY BREAST Right   . HERNIA REPAIR      Family History  Adopted: Yes  Problem Relation Age of Onset  . Other Unknown        adopted    No Known Allergies  Current Outpatient Medications on File Prior to Visit  Medication Sig Dispense Refill  . albuterol (PROAIR HFA) 108 (90 Base) MCG/ACT inhaler INHALE 2 PUFFS INTO THE LUNGS EVERY 4 (FOUR) HOURS AS NEEDED FOR WHEEZING OR SHORTNESS OF BREATH. 18 g 0  . Cholecalciferol (VITAMIN D PO) Take 1 capsule by mouth daily.    .  cyanocobalamin 500 MCG tablet Take 500 mcg by mouth daily.    . fluticasone (FLOVENT HFA) 44 MCG/ACT inhaler Inhale 2 puffs into the lungs 2 (two) times daily. 1 Inhaler 3  . ibuprofen (ADVIL,MOTRIN) 200 MG tablet Take 400-600 mg by mouth every 6 (six) hours as needed for mild pain or moderate pain.      No current facility-administered medications on file prior to visit.     BP 124/80   Pulse 91   Temp 97.7 F (36.5 C) (Temporal)   Ht 5\' 6"  (1.676 m)   Wt 238 lb 8 oz (108.2 kg)   LMP 11/10/2016   SpO2 99%   BMI 38.49 kg/m    Objective:   Physical Exam  Constitutional: She is oriented to person, place, and time. She appears well-nourished.  HENT:  Mouth/Throat: No oropharyngeal exudate.  Eyes: Pupils are equal, round, and reactive to light. EOM are normal.  Neck: Neck supple. No thyromegaly present.  Cardiovascular: Normal rate and regular rhythm.  Respiratory: Effort normal and breath sounds normal.  GI: Soft. Bowel sounds are normal. There is no abdominal tenderness.  Musculoskeletal: Normal range of motion.  Neurological: She is alert and oriented to person, place, and time.  Skin: Skin is warm and dry.  Psychiatric: She has a normal mood and affect.           Assessment & Plan:

## 2019-05-02 NOTE — Assessment & Plan Note (Signed)
Did well on Effexor for quite some time then stopped taking. Hot flashes have resumed, will refill Effexor. She will update.

## 2019-05-02 NOTE — Assessment & Plan Note (Signed)
Immunizations UTD. Pap smear UTD. Mammogram due, she declines today and will notify when ready. Discussed the importance of a healthy diet and regular exercise in order for weight loss, and to reduce the risk of any potential medical problems. Exam stable. Labs reviewed.

## 2019-05-07 ENCOUNTER — Ambulatory Visit (INDEPENDENT_AMBULATORY_CARE_PROVIDER_SITE_OTHER): Payer: PRIVATE HEALTH INSURANCE | Admitting: *Deleted

## 2019-05-07 ENCOUNTER — Telehealth: Payer: Self-pay | Admitting: Primary Care

## 2019-05-07 DIAGNOSIS — Z111 Encounter for screening for respiratory tuberculosis: Secondary | ICD-10-CM

## 2019-05-07 NOTE — Telephone Encounter (Signed)
Done

## 2019-05-07 NOTE — Telephone Encounter (Signed)
Patient forgot to come by on Monday to have her TB test read.  Patient wants to know if she can come by today to have another TB test done, so it can be read on Friday.

## 2019-05-09 ENCOUNTER — Encounter: Payer: Self-pay | Admitting: *Deleted

## 2019-05-09 LAB — TB SKIN TEST
Induration: 0 mm
TB Skin Test: NEGATIVE

## 2019-06-02 DIAGNOSIS — J4530 Mild persistent asthma, uncomplicated: Secondary | ICD-10-CM

## 2019-06-02 DIAGNOSIS — J4520 Mild intermittent asthma, uncomplicated: Secondary | ICD-10-CM

## 2019-06-02 MED ORDER — FLOVENT HFA 110 MCG/ACT IN AERO: 1.0000 | INHALATION_SPRAY | Freq: Two times a day (BID) | RESPIRATORY_TRACT | 3 refills | Status: DC

## 2019-06-02 MED ORDER — ALBUTEROL SULFATE HFA 108 (90 BASE) MCG/ACT IN AERS: INHALATION_SPRAY | RESPIRATORY_TRACT | 0 refills | Status: DC

## 2019-06-24 ENCOUNTER — Other Ambulatory Visit: Payer: Self-pay

## 2019-06-24 DIAGNOSIS — Z20822 Contact with and (suspected) exposure to covid-19: Secondary | ICD-10-CM

## 2019-06-25 LAB — NOVEL CORONAVIRUS, NAA: SARS-CoV-2, NAA: NOT DETECTED

## 2019-07-23 ENCOUNTER — Telehealth: Payer: Self-pay | Admitting: Primary Care

## 2019-07-23 NOTE — Telephone Encounter (Signed)
Noted. She will have to notified us when the assistant who is on flu clinic that day.

## 2019-07-23 NOTE — Telephone Encounter (Signed)
Patient is coming to the office on 10/20 @ 11:00 to have her flu vaccine done. 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

## 2019-07-29 ENCOUNTER — Ambulatory Visit: Payer: PRIVATE HEALTH INSURANCE

## 2019-07-31 ENCOUNTER — Ambulatory Visit (INDEPENDENT_AMBULATORY_CARE_PROVIDER_SITE_OTHER): Payer: PRIVATE HEALTH INSURANCE

## 2019-07-31 ENCOUNTER — Encounter: Payer: Self-pay | Admitting: Primary Care

## 2019-07-31 DIAGNOSIS — Z23 Encounter for immunization: Secondary | ICD-10-CM | POA: Diagnosis not present

## 2019-08-15 ENCOUNTER — Emergency Department (HOSPITAL_COMMUNITY): Payer: PRIVATE HEALTH INSURANCE

## 2019-08-15 ENCOUNTER — Emergency Department (HOSPITAL_COMMUNITY): Payer: PRIVATE HEALTH INSURANCE | Admitting: Anesthesiology

## 2019-08-15 ENCOUNTER — Observation Stay (HOSPITAL_COMMUNITY)
Admission: EM | Admit: 2019-08-15 | Discharge: 2019-08-16 | Disposition: A | Discharge status: Home / Self Care | Payer: PRIVATE HEALTH INSURANCE | Attending: General Surgery | Admitting: General Surgery

## 2019-08-15 ENCOUNTER — Other Ambulatory Visit: Payer: Self-pay

## 2019-08-15 ENCOUNTER — Encounter (HOSPITAL_COMMUNITY)
Admission: EM | Disposition: A | Discharge status: Home / Self Care | Payer: Self-pay | Source: Home / Self Care | Attending: Emergency Medicine

## 2019-08-15 ENCOUNTER — Encounter (HOSPITAL_COMMUNITY): Payer: Self-pay | Admitting: Emergency Medicine

## 2019-08-15 DIAGNOSIS — Z79899 Other long term (current) drug therapy: Secondary | ICD-10-CM | POA: Insufficient documentation

## 2019-08-15 DIAGNOSIS — Z87891 Personal history of nicotine dependence: Secondary | ICD-10-CM | POA: Diagnosis not present

## 2019-08-15 DIAGNOSIS — M1990 Unspecified osteoarthritis, unspecified site: Secondary | ICD-10-CM | POA: Diagnosis not present

## 2019-08-15 DIAGNOSIS — Z20828 Contact with and (suspected) exposure to other viral communicable diseases: Secondary | ICD-10-CM | POA: Insufficient documentation

## 2019-08-15 DIAGNOSIS — G709 Myoneural disorder, unspecified: Secondary | ICD-10-CM | POA: Diagnosis not present

## 2019-08-15 DIAGNOSIS — D72829 Elevated white blood cell count, unspecified: Secondary | ICD-10-CM | POA: Diagnosis not present

## 2019-08-15 DIAGNOSIS — K353 Acute appendicitis with localized peritonitis, without perforation or gangrene: Principal | ICD-10-CM

## 2019-08-15 DIAGNOSIS — J45909 Unspecified asthma, uncomplicated: Secondary | ICD-10-CM | POA: Diagnosis not present

## 2019-08-15 DIAGNOSIS — Z7951 Long term (current) use of inhaled steroids: Secondary | ICD-10-CM | POA: Diagnosis not present

## 2019-08-15 DIAGNOSIS — I700 Atherosclerosis of aorta: Secondary | ICD-10-CM | POA: Diagnosis not present

## 2019-08-15 DIAGNOSIS — Z9049 Acquired absence of other specified parts of digestive tract: Secondary | ICD-10-CM

## 2019-08-15 HISTORY — PX: LAPAROSCOPIC APPENDECTOMY: SHX408

## 2019-08-15 LAB — CBC
HCT: 42.3 % (ref 36.0–46.0)
Hemoglobin: 13.7 g/dL (ref 12.0–15.0)
MCH: 30.2 pg (ref 26.0–34.0)
MCHC: 32.4 g/dL (ref 30.0–36.0)
MCV: 93.2 fL (ref 80.0–100.0)
Platelets: 307 10*3/uL (ref 150–400)
RBC: 4.54 MIL/uL (ref 3.87–5.11)
RDW: 13.4 % (ref 11.5–15.5)
WBC: 13.1 10*3/uL — ABNORMAL HIGH (ref 4.0–10.5)
nRBC: 0 % (ref 0.0–0.2)

## 2019-08-15 LAB — COMPREHENSIVE METABOLIC PANEL WITH GFR
ALT: 27 U/L (ref 0–44)
AST: 24 U/L (ref 15–41)
Albumin: 4.2 g/dL (ref 3.5–5.0)
Alkaline Phosphatase: 87 U/L (ref 38–126)
Anion gap: 8 (ref 5–15)
BUN: 14 mg/dL (ref 6–20)
CO2: 29 mmol/L (ref 22–32)
Calcium: 8.9 mg/dL (ref 8.9–10.3)
Chloride: 100 mmol/L (ref 98–111)
Creatinine, Ser: 0.82 mg/dL (ref 0.44–1.00)
GFR calc Af Amer: >60 mL/min
GFR calc non Af Amer: >60 mL/min
Glucose, Bld: 104 mg/dL — ABNORMAL HIGH (ref 70–99)
Potassium: 3.9 mmol/L (ref 3.5–5.1)
Sodium: 137 mmol/L (ref 135–145)
Total Bilirubin: 0.6 mg/dL (ref 0.3–1.2)
Total Protein: 7.7 g/dL (ref 6.5–8.1)

## 2019-08-15 LAB — URINALYSIS, ROUTINE W REFLEX MICROSCOPIC
Bilirubin Urine: NEGATIVE
Glucose, UA: NEGATIVE mg/dL
Hgb urine dipstick: NEGATIVE
Ketones, ur: 5 mg/dL — AB
Leukocytes,Ua: NEGATIVE
Nitrite: NEGATIVE
Protein, ur: NEGATIVE mg/dL
Specific Gravity, Urine: 1.02 (ref 1.005–1.030)
pH: 6 (ref 5.0–8.0)

## 2019-08-15 LAB — LIPASE, BLOOD: Lipase: 26 U/L (ref 11–51)

## 2019-08-15 LAB — SARS CORONAVIRUS 2 BY RT PCR (HOSPITAL ORDER, PERFORMED IN ~~LOC~~ HOSPITAL LAB): SARS Coronavirus 2: NEGATIVE

## 2019-08-15 LAB — HCG, SERUM, QUALITATIVE: Preg, Serum: NEGATIVE

## 2019-08-15 SURGERY — APPENDECTOMY, LAPAROSCOPIC
Anesthesia: General | Site: Abdomen

## 2019-08-15 MED ORDER — PROPOFOL 10 MG/ML IV BOLUS
INTRAVENOUS | Status: DC | PRN
  Administered 2019-08-15: 150 mg via INTRAVENOUS

## 2019-08-15 MED ORDER — MORPHINE SULFATE (PF) 4 MG/ML IV SOLN
4.0000 mg | Freq: Once | INTRAVENOUS | Status: AC
  Administered 2019-08-15: 4 mg via INTRAVENOUS
  Filled 2019-08-15: qty 1

## 2019-08-15 MED ORDER — SODIUM CHLORIDE 0.9 % IV SOLN
2.0000 g | Freq: Once | INTRAVENOUS | Status: AC
  Administered 2019-08-15: 2 g via INTRAVENOUS
  Filled 2019-08-15: qty 20

## 2019-08-15 MED ORDER — HYDROMORPHONE HCL 1 MG/ML IJ SOLN
1.0000 mg | Freq: Once | INTRAMUSCULAR | Status: AC
  Administered 2019-08-15: 1 mg via INTRAVENOUS
  Filled 2019-08-15: qty 1

## 2019-08-15 MED ORDER — ONDANSETRON HCL 4 MG/2ML IJ SOLN
4.0000 mg | Freq: Once | INTRAMUSCULAR | Status: AC
  Administered 2019-08-15: 4 mg via INTRAVENOUS
  Filled 2019-08-15: qty 2

## 2019-08-15 MED ORDER — SODIUM CHLORIDE 0.9 % IV BOLUS
1000.0000 mL | Freq: Once | INTRAVENOUS | Status: AC
  Administered 2019-08-15: 1000 mL via INTRAVENOUS

## 2019-08-15 MED ORDER — DEXAMETHASONE SODIUM PHOSPHATE 10 MG/ML IJ SOLN
INTRAMUSCULAR | Status: AC
  Filled 2019-08-15: qty 1

## 2019-08-15 MED ORDER — BUPIVACAINE LIPOSOME 1.3 % IJ SUSP
INTRAMUSCULAR | Status: AC
  Filled 2019-08-15: qty 20

## 2019-08-15 MED ORDER — ROCURONIUM BROMIDE 10 MG/ML (PF) SYRINGE
PREFILLED_SYRINGE | INTRAVENOUS | Status: AC
  Filled 2019-08-15: qty 10

## 2019-08-15 MED ORDER — LIDOCAINE 2% (20 MG/ML) 5 ML SYRINGE
INTRAMUSCULAR | Status: AC
  Filled 2019-08-15: qty 5

## 2019-08-15 MED ORDER — SODIUM CHLORIDE 0.9% FLUSH: 3.0000 mL | Freq: Once | INTRAVENOUS | Status: DC

## 2019-08-15 MED ORDER — MIDAZOLAM HCL 2 MG/2ML IJ SOLN
INTRAMUSCULAR | Status: AC
  Filled 2019-08-15: qty 2

## 2019-08-15 MED ORDER — SUCCINYLCHOLINE CHLORIDE 200 MG/10ML IV SOSY
PREFILLED_SYRINGE | INTRAVENOUS | Status: AC
  Filled 2019-08-15: qty 10

## 2019-08-15 MED ORDER — IOHEXOL 300 MG/ML  SOLN
100.0000 mL | Freq: Once | INTRAMUSCULAR | Status: AC | PRN
  Administered 2019-08-15: 100 mL via INTRAVENOUS

## 2019-08-15 MED ORDER — ONDANSETRON HCL 4 MG/2ML IJ SOLN
INTRAMUSCULAR | Status: AC
  Filled 2019-08-15: qty 2

## 2019-08-15 MED ORDER — METRONIDAZOLE IN NACL 5-0.79 MG/ML-% IV SOLN
500.0000 mg | Freq: Once | INTRAVENOUS | Status: AC
  Administered 2019-08-15: 500 mg via INTRAVENOUS
  Filled 2019-08-15: qty 100

## 2019-08-15 MED ORDER — FENTANYL CITRATE (PF) 250 MCG/5ML IJ SOLN
INTRAMUSCULAR | Status: AC
  Filled 2019-08-15: qty 5

## 2019-08-15 MED ORDER — PROPOFOL 10 MG/ML IV BOLUS
INTRAVENOUS | Status: AC
  Filled 2019-08-15: qty 20

## 2019-08-15 SURGICAL SUPPLY — 47 items
BAG RETRIEVAL 10 (BASKET) ×1
CHLORAPREP W/TINT 26 (MISCELLANEOUS) ×2 IMPLANT
CLOTH BEACON ORANGE TIMEOUT ST (SAFETY) ×2 IMPLANT
COVER LIGHT HANDLE STERIS (MISCELLANEOUS) ×4 IMPLANT
COVER WAND RF STERILE (DRAPES) ×2 IMPLANT
CUTTER FLEX LINEAR 45M (STAPLE) ×2 IMPLANT
DERMABOND ADVANCED (GAUZE/BANDAGES/DRESSINGS) ×1
DERMABOND ADVANCED .7 DNX12 (GAUZE/BANDAGES/DRESSINGS) ×1 IMPLANT
ELECT REM PT RETURN 9FT ADLT (ELECTROSURGICAL) ×2
ELECTRODE REM PT RTRN 9FT ADLT (ELECTROSURGICAL) ×1 IMPLANT
EVACUATOR SMOKE 8.L (FILTER) ×2 IMPLANT
GLOVE BIO SURGEON STRL SZ7 (GLOVE) ×2 IMPLANT
GLOVE BIOGEL PI IND STRL 7.0 (GLOVE) ×3 IMPLANT
GLOVE BIOGEL PI INDICATOR 7.0 (GLOVE) ×3
GLOVE SURG SS PI 7.5 STRL IVOR (GLOVE) ×2 IMPLANT
GOWN STRL REUS W/ TWL XL LVL3 (GOWN DISPOSABLE) ×1 IMPLANT
GOWN STRL REUS W/TWL LRG LVL3 (GOWN DISPOSABLE) ×2 IMPLANT
GOWN STRL REUS W/TWL XL LVL3 (GOWN DISPOSABLE) ×1
INST SET LAPROSCOPIC AP (KITS) ×2 IMPLANT
IV NS IRRIG 3000ML ARTHROMATIC (IV SOLUTION) IMPLANT
KIT TURNOVER KIT A (KITS) ×2 IMPLANT
MANIFOLD NEPTUNE II (INSTRUMENTS) ×2 IMPLANT
NEEDLE HYPO 18GX1.5 BLUNT FILL (NEEDLE) ×2 IMPLANT
NEEDLE HYPO 22GX1.5 SAFETY (NEEDLE) ×2 IMPLANT
NEEDLE INSUFFLATION 14GA 120MM (NEEDLE) ×2 IMPLANT
NS IRRIG 1000ML POUR BTL (IV SOLUTION) ×2 IMPLANT
PACK LAP CHOLE LZT030E (CUSTOM PROCEDURE TRAY) ×2 IMPLANT
PAD ARMBOARD 7.5X6 YLW CONV (MISCELLANEOUS) ×2 IMPLANT
PENCIL HANDSWITCHING (ELECTRODE) ×2 IMPLANT
RELOAD STAPLE TA45 3.5 REG BLU (ENDOMECHANICALS) ×2 IMPLANT
SET BASIN LINEN APH (SET/KITS/TRAYS/PACK) ×2 IMPLANT
SET TUBE IRRIG SUCTION NO TIP (IRRIGATION / IRRIGATOR) IMPLANT
SET TUBE SMOKE EVAC HIGH FLOW (TUBING) ×2 IMPLANT
SHEARS HARMONIC ACE PLUS 36CM (ENDOMECHANICALS) ×2 IMPLANT
SUT MNCRL AB 4-0 PS2 18 (SUTURE) ×4 IMPLANT
SUT VICRYL 0 UR6 27IN ABS (SUTURE) ×2 IMPLANT
SYR 20ML LL LF (SYRINGE) ×4 IMPLANT
SYS BAG RETRIEVAL 10MM (BASKET) ×1
SYSTEM BAG RETRIEVAL 10MM (BASKET) ×1 IMPLANT
TRAY FOLEY W/BAG SLVR 16FR (SET/KITS/TRAYS/PACK) ×1
TRAY FOLEY W/BAG SLVR 16FR ST (SET/KITS/TRAYS/PACK) ×1 IMPLANT
TROCAR ENDO BLADELESS 11MM (ENDOMECHANICALS) ×2 IMPLANT
TROCAR ENDO BLADELESS 12MM (ENDOMECHANICALS) ×2 IMPLANT
TROCAR XCEL NON-BLD 5MMX100MML (ENDOMECHANICALS) ×2 IMPLANT
TUBE CONNECTING 12X1/4 (SUCTIONS) ×2 IMPLANT
WARMER LAPAROSCOPE (MISCELLANEOUS) ×2 IMPLANT
YANKAUER SUCT 12FT TUBE ARGYLE (SUCTIONS) ×2 IMPLANT

## 2019-08-15 NOTE — H&P (Signed)
Beverly Wright is an 48 y.o. female.   Chief Complaint: Right lower quadrant abdominal pain HPI: Patient is a 48 year old white female who presented emergency room with a less than 24-hour history of worsening right lower quadrant abdominal pain.  CT scan of the abdomen reveals acute appendicitis.  Appendicoliths are present.  No evidence of perforation at this time.  Patient does have generalized malaise.  Her pain is 4 out of 10.  Past Medical History:  Diagnosis Date  . Asthma   . Attention deficit hyperactivity disorder (ADHD) 02/05/2015   dx age 57   . Back pain     Past Surgical History:  Procedure Laterality Date  . BIOPSY BREAST Right   . HERNIA REPAIR      Family History  Adopted: Yes  Problem Relation Age of Onset  . Other Other        adopted   Social History:  reports that she quit smoking about 2 years ago. Her smoking use included cigarettes. She has a 4.60 pack-year smoking history. She has never used smokeless tobacco. She reports that she does not drink alcohol or use drugs.  Allergies: No Known Allergies  (Not in a hospital admission)   Results for orders placed or performed during the hospital encounter of 08/15/19 (from the past 48 hour(s))  Lipase, blood     Status: None   Collection Time: 08/15/19  5:45 PM  Result Value Ref Range   Lipase 26 11 - 51 U/L    Comment: Performed at [facility], [address], [place], [place] [address]  Comprehensive metabolic panel     Status: Abnormal   Collection Time: 08/15/19  5:45 PM  Result Value Ref Range   Sodium 137 135 - 145 mmol/L   Potassium 3.9 3.5 - 5.1 mmol/L   Chloride 100 98 - 111 mmol/L   CO2 29 22 - 32 mmol/L   Glucose, Bld 104 (H) 70 - 99 mg/dL   BUN 14 6 - 20 mg/dL   Creatinine, Ser 0.82 0.44 - 1.00 mg/dL   Calcium 8.9 8.9 - 10.3 mg/dL   Total Protein 7.7 6.5 - 8.1 g/dL   Albumin 4.2 3.5 - 5.0 g/dL   AST 24 15 - 41 U/L   ALT 27 0 - 44 U/L   Alkaline Phosphatase 87 38 - 126 U/L   Total  Bilirubin 0.6 0.3 - 1.2 mg/dL   GFR calc non Af Amer >60 >60 mL/min   GFR calc Af Amer >60 >60 mL/min   Anion gap 8 5 - 15    Comment: Performed at [facility], [address], [place], [place] [address]  CBC     Status: Abnormal   Collection Time: 08/15/19  5:45 PM  Result Value Ref Range   WBC 13.1 (H) 4.0 - 10.5 K/uL   RBC 4.54 3.87 - 5.11 MIL/uL   Hemoglobin 13.7 12.0 - 15.0 g/dL   HCT 42.3 36.0 - 46.0 %   MCV 93.2 80.0 - 100.0 fL   MCH 30.2 26.0 - 34.0 pg   MCHC 32.4 30.0 - 36.0 g/dL   RDW 13.4 11.5 - 15.5 %   Platelets 307 150 - 400 K/uL   nRBC 0.0 0.0 - 0.2 %    Comment: Performed at [facility], [address] [place],  [address]  hCG, serum, qualitative     Status: None   Collection Time: 08/15/19  5:45 PM  Result Value Ref Range   Preg, Serum NEGATIVE  NEGATIVE    Comment:        THE SENSITIVITY OF THIS METHODOLOGY IS >10 mIU/mL. Performed at [facility], [address], [place], [place] [address]   Urinalysis, Routine w reflex microscopic     Status: Abnormal   Collection Time: 08/15/19  7:43 PM  Result Value Ref Range   Color, Urine YELLOW YELLOW   APPearance HAZY (A) CLEAR   Specific Gravity, Urine 1.020 1.005 - 1.030   pH 6.0 5.0 - 8.0   Glucose, UA NEGATIVE NEGATIVE mg/dL   Hgb urine dipstick NEGATIVE NEGATIVE   Bilirubin Urine NEGATIVE NEGATIVE   Ketones, ur 5 (A) NEGATIVE mg/dL   Protein, ur NEGATIVE NEGATIVE mg/dL   Nitrite NEGATIVE NEGATIVE   Leukocytes,Ua NEGATIVE NEGATIVE    Comment: Performed at [facility], [address], [place], [place] [address]  SARS Coronavirus 2 by RT PCR (hospital order, performed in [facility] hospital lab) Nasopharyngeal Nasopharyngeal Swab     Status: None   Collection Time: 08/15/19  9:28 PM   Specimen: Nasopharyngeal Swab  Result Value Ref Range   SARS Coronavirus 2 NEGATIVE NEGATIVE    Comment: (NOTE) If result is NEGATIVE SARS-CoV-2 target nucleic acids are NOT DETECTED. The SARS-CoV-2 RNA is generally  detectable in upper and lower  respiratory specimens during the acute phase of infection. The lowest  concentration of SARS-CoV-2 viral copies this assay can detect is 250  copies / mL. A negative result does not preclude SARS-CoV-2 infection  and should not be used as the sole basis for treatment or other  patient management decisions.  A negative result may occur with  improper specimen collection / handling, submission of specimen other  than nasopharyngeal swab, presence of viral mutation(s) within the  areas targeted by this assay, and inadequate number of viral copies  (<250 copies / mL). A negative result must be combined with clinical  observations, patient history, and epidemiological information. If result is POSITIVE SARS-CoV-2 target nucleic acids are DETECTED. The SARS-CoV-2 RNA is generally detectable in upper and lower  respiratory specimens dur ing the acute phase of infection.  Positive  results are indicative of active infection with SARS-CoV-2.  Clinical  correlation with patient history and other diagnostic information is  necessary to determine patient infection status.  Positive results do  not rule out bacterial infection or co-infection with other viruses. If result is PRESUMPTIVE POSTIVE SARS-CoV-2 nucleic acids MAY BE PRESENT.   A presumptive positive result was obtained on the submitted specimen  and confirmed on repeat testing.  While 2019 novel coronavirus  (SARS-CoV-2) nucleic acids may be present in the submitted sample  additional confirmatory testing may be necessary for epidemiological  and / or clinical management purposes  to differentiate between  SARS-CoV-2 and other Sarbecovirus currently known to infect humans.  If clinically indicated additional testing with an alternate test  methodology [id number]) is advised. The SARS-CoV-2 RNA is generally  detectable in upper and lower respiratory sp ecimens during the acute  phase of infection. The  expected result is Negative. Fact Sheet for Patients:  StrictlyIdeas.no Fact Sheet for Healthcare Providers: BankingDealers.co.za This test is not yet approved or cleared by the [place] FDA and has been authorized for detection and/or diagnosis of SARS-CoV-2 by FDA under an Emergency Use Authorization (EUA).  This EUA will remain in effect (meaning this test can be used) for the duration of the COVID-19 declaration under Section 564(b)(1) of the Act, 21 U.S.C. section 360bbb-3(b)(1), unless the  authorization is terminated or revoked sooner. Performed at [facility], [address], [place], [place] [address]    Ct Abdomen Pelvis W Contrast  Result Date: 08/15/2019 CLINICAL DATA:  Abdominal pain, appendicitis suspected EXAM: CT ABDOMEN AND PELVIS WITH CONTRAST TECHNIQUE: Multidetector CT imaging of the abdomen and pelvis was performed using the standard protocol following bolus administration of intravenous contrast. CONTRAST:  182mL OMNIPAQUE IOHEXOL 300 MG/ML  SOLN COMPARISON:  CT abdomen 01/11/2018, CT abdomen pelvis 08/10/2017 FINDINGS: Lower chest: Bandlike opacities in the bases, likely atelectasis or scarring. Normal heart size. No pericardial effusion. Hepatobiliary: Stable calcifications adjacent the gallbladder fossa. No concerning focal liver abnormality is seen. No gallstones, gallbladder wall thickening, or biliary dilatation. Pancreas: Unremarkable. No pancreatic ductal dilatation or surrounding inflammatory changes. Spleen: Normal in size without focal abnormality. Small accessory splenules Adrenals/Urinary Tract: Adrenal glands are unremarkable. Kidneys are normal, without renal calculi, focal lesion, or hydronephrosis. Bladder is unremarkable. Symmetric excretion without extravasation of contrast on excretory phase delayed imaging. Stomach/Bowel: Distal esophagus, stomach and duodenal sweep are unremarkable. No small bowel wall  thickening or dilatation. No evidence of obstruction. Dilated (17 mm), fluid-filled, and hyperemic appendix with several fecaliths present within the appendiceal lumen. Significant adjacent periappendiceal phlegmonous change in few reactive lymph nodes. No extraluminal gas or organized collection. Reactive appearing free fluid tracks to the deep pelvis. No colonic dilatation or wall thickening. Scattered colonic diverticula without focal pericolonic inflammation to suggest diverticulitis. Vascular/Lymphatic: Atherosclerotic plaque within the normal caliber aorta. No suspicious or enlarged lymph nodes in the included lymphatic chains. Reproductive: Normal appearance of the uterus and adnexal structures. Other: Right lower quadrant phlegmon and trace reactive free fluid, as above. No free air in the abdomen or pelvis. No bowel containing hernia. Musculoskeletal: No acute osseous abnormality or suspicious osseous lesion. IMPRESSION: Acute appendicitis without evidence of perforation or abscess formation. Diverticulosis without evidence of acute diverticulitis. Stable hepatic calcifications adjacent the gallbladder fossa, likely benign etiology such as calcified granulomata. Aortic Atherosclerosis (ICD10-I70.0). Electronically Signed   By: Lovena Le M.D.   On: 08/15/2019 20:46    ROS  Blood pressure (!) 146/80, pulse (!) 110, temperature 98.9 F (37.2 C), temperature source Oral, resp. rate (!) 22, height 5\' 7"  (1.702 m), weight 99.8 kg, last menstrual period 11/10/2016, SpO2 100 %. Physical Exam  Well-developed well-nourished white female no acute distress Lungs clear to auscultation with equal breath sounds bilaterally Heart examination reveals a regular rate and rhythm without S3, S4, murmurs Abdomen is soft with tenderness in the right lower quadrant to palpation.  No rigidity is noted.  CT scan images personally reviewed  Assessment/Plan Impression: Acute appendicitis Plan: Patient will be taken  to the operating room for laparoscopic appendectomy.  The risks and benefits of the procedure including bleeding, infection, and the possibility of an open procedure were fully explained to the patient, who gave informed consent.  Aviva Signs, MD 08/15/2019, 11:01 PM

## 2019-08-15 NOTE — ED Notes (Signed)
Dr. Jenkins at bedside. 

## 2019-08-15 NOTE — ED Notes (Signed)
Pt taken to OR.

## 2019-08-15 NOTE — ED Provider Notes (Signed)
[facility] EMERGENCY DEPARTMENT Provider Note   CSN: [id number] Arrival date & time: 08/15/19  1658     History   Chief Complaint Chief Complaint  Patient presents with   Abdominal Pain    HPI Beverly Wright is a 48 y.o. female.     48 year old female presents with complaint of right lower quadrant abdominal pain.  Patient reports abdominal discomfort onset this morning described as feeling like she did have a bowel movement however has taken medication for gas without relief of her pain.  Pain is become progressively worse, is constant, sharp in nature, radiates to suprapubic area, worse with movement and walking, associated with nausea and loss of appetite as well as chills.  Denies fevers, vomiting, changes in bowel or bladder habits.  No prior abdominal surgeries.  Patient states that she has had bilateral inguinal hernia repair in the past.  No other complaints or concerns today.     Past Medical History:  Diagnosis Date   Asthma    Attention deficit hyperactivity disorder (ADHD) 02/05/2015   dx age 1    Back pain     Patient Active Problem List   Diagnosis Date Noted   Peripheral edema 12/28/2017   Prediabetes 12/28/2017   Vasomotor symptoms due to menopause 12/28/2017   Change in consistency of stool 12/28/2017   Facet arthropathy, lumbar 12/07/2016   Spondylosis without myelopathy or radiculopathy, lumbar region 09/13/2016   Lumbar discogenic pain syndrome 03/17/2016   Preventative health care 03/01/2016   DDD (degenerative disc disease), lumbar 02/10/2016   Facet syndrome, lumbar 02/10/2016   Lumbar radiculopathy 02/10/2016   Asthma 11/25/2015   Chronic low back pain with bilateral sciatica 09/29/2015   Attention deficit hyperactivity disorder (ADHD) 02/05/2015    Past Surgical History:  Procedure Laterality Date   BIOPSY BREAST Right    HERNIA REPAIR       OB History    Gravida  5   Para  4   Term  3   Preterm  1   AB    1   Living  4     SAB  1   TAB      Ectopic      Multiple      Live Births               Home Medications    Prior to Admission medications   Medication Sig Start Date End Date Taking? Authorizing Provider  albuterol (PROAIR HFA) 108 (90 Base) MCG/ACT inhaler INHALE 2 PUFFS INTO THE LUNGS EVERY 4 (FOUR) HOURS AS NEEDED FOR WHEEZING OR SHORTNESS OF BREATH. 06/02/19  Yes Pleas Koch, NP  Cholecalciferol (VITAMIN D PO) Take 1 capsule by mouth daily.   Yes [provider]  cyanocobalamin 500 MCG tablet Take 500 mcg by mouth daily.   Yes [provider]  fluticasone (FLOVENT HFA) 110 MCG/ACT inhaler Inhale 1-2 puffs into the lungs 2 (two) times daily. 06/02/19  Yes Pleas Koch, NP  ibuprofen (ADVIL,MOTRIN) 200 MG tablet Take 400-600 mg by mouth every 6 (six) hours as needed for mild pain or moderate pain.    Yes [provider]  venlafaxine XR (EFFEXOR XR) 37.5 MG 24 hr capsule Take 1 capsule (37.5 mg total) by mouth daily with breakfast. For hot flashes 05/02/19  Yes Pleas Koch, NP    Family History Family History  Adopted: Yes  Problem Relation Age of Onset   Other Other  adopted    Social History Social History   Tobacco Use   Smoking status: Former Smoker    Packs/day: 0.20    Years: 23.00    Pack years: 4.60    Types: Cigarettes    Quit date: 06/11/2017    Years since quitting: 2.1   Smokeless tobacco: Never Used  Substance Use Topics   Alcohol use: No    Alcohol/week: 0.0 standard drinks   Drug use: No     Allergies   Patient has no known allergies.   Review of Systems Review of Systems  Constitutional: Positive for chills. Negative for fever.  Respiratory: Negative for shortness of breath.   Cardiovascular: Negative for chest pain.  Gastrointestinal: Positive for abdominal pain and nausea. Negative for constipation, diarrhea and vomiting.  Genitourinary: Negative for difficulty urinating,  dysuria, frequency and vaginal discharge.  Musculoskeletal: Negative for back pain and myalgias.  Skin: Negative for rash and wound.  Allergic/Immunologic: Negative for immunocompromised state.  Neurological: Negative for weakness.  Hematological: Negative for adenopathy.  All other systems reviewed and are negative.    Physical Exam Updated Vital Signs BP (!) 146/80 (BP Location: Left Arm)    Pulse (!) 110    Temp 98.9 F (37.2 C) (Oral)    Resp (!) 22    Ht 5\' 7"  (1.702 m)    Wt 99.8 kg    LMP 11/10/2016    SpO2 100%    BMI 34.46 kg/m   Physical Exam Vitals signs and nursing note reviewed.  Constitutional:      General: She is not in acute distress.    Appearance: She is well-developed. She is not diaphoretic.  HENT:     Head: Normocephalic and atraumatic.  Cardiovascular:     Rate and Rhythm: Regular rhythm. Tachycardia present.     Heart sounds: Normal heart sounds.  Pulmonary:     Effort: Pulmonary effort is normal.     Breath sounds: Normal breath sounds.  Abdominal:     Tenderness: There is generalized abdominal tenderness and tenderness in the right lower quadrant. There is guarding and rebound.  Skin:    General: Skin is warm and dry.     Findings: No rash.  Neurological:     Mental Status: She is alert and oriented to person, place, and time.  Psychiatric:        Behavior: Behavior normal.      ED Treatments / Results  Labs (all labs ordered are listed, but only abnormal results are displayed) Labs Reviewed  COMPREHENSIVE METABOLIC PANEL - Abnormal; Notable for the following components:      Result Value   Glucose, Bld 104 (*)    All other components within normal limits  CBC - Abnormal; Notable for the following components:   WBC 13.1 (*)    All other components within normal limits  URINALYSIS, ROUTINE W REFLEX MICROSCOPIC - Abnormal; Notable for the following components:   APPearance HAZY (*)    Ketones, ur 5 (*)    All other components within  normal limits  SARS CORONAVIRUS 2 BY RT PCR (HOSPITAL ORDER, [place] [facility])  LIPASE, BLOOD  HCG, SERUM, QUALITATIVE    EKG None  Radiology Ct Abdomen Pelvis W Contrast  Result Date: 08/15/2019 CLINICAL DATA:  Abdominal pain, appendicitis suspected EXAM: CT ABDOMEN AND PELVIS WITH CONTRAST TECHNIQUE: Multidetector CT imaging of the abdomen and pelvis was performed using the standard protocol following bolus administration of intravenous contrast. CONTRAST:  153mL OMNIPAQUE IOHEXOL 300 MG/ML  SOLN COMPARISON:  CT abdomen 01/11/2018, CT abdomen pelvis 08/10/2017 FINDINGS: Lower chest: Bandlike opacities in the bases, likely atelectasis or scarring. Normal heart size. No pericardial effusion. Hepatobiliary: Stable calcifications adjacent the gallbladder fossa. No concerning focal liver abnormality is seen. No gallstones, gallbladder wall thickening, or biliary dilatation. Pancreas: Unremarkable. No pancreatic ductal dilatation or surrounding inflammatory changes. Spleen: Normal in size without focal abnormality. Small accessory splenules Adrenals/Urinary Tract: Adrenal glands are unremarkable. Kidneys are normal, without renal calculi, focal lesion, or hydronephrosis. Bladder is unremarkable. Symmetric excretion without extravasation of contrast on excretory phase delayed imaging. Stomach/Bowel: Distal esophagus, stomach and duodenal sweep are unremarkable. No small bowel wall thickening or dilatation. No evidence of obstruction. Dilated (17 mm), fluid-filled, and hyperemic appendix with several fecaliths present within the appendiceal lumen. Significant adjacent periappendiceal phlegmonous change in few reactive lymph nodes. No extraluminal gas or organized collection. Reactive appearing free fluid tracks to the deep pelvis. No colonic dilatation or wall thickening. Scattered colonic diverticula without focal pericolonic inflammation to suggest diverticulitis. Vascular/Lymphatic:  Atherosclerotic plaque within the normal caliber aorta. No suspicious or enlarged lymph nodes in the included lymphatic chains. Reproductive: Normal appearance of the uterus and adnexal structures. Other: Right lower quadrant phlegmon and trace reactive free fluid, as above. No free air in the abdomen or pelvis. No bowel containing hernia. Musculoskeletal: No acute osseous abnormality or suspicious osseous lesion. IMPRESSION: Acute appendicitis without evidence of perforation or abscess formation. Diverticulosis without evidence of acute diverticulitis. Stable hepatic calcifications adjacent the gallbladder fossa, likely benign etiology such as calcified granulomata. Aortic Atherosclerosis (ICD10-I70.0). Electronically Signed   By: Lovena Le M.D.   On: 08/15/2019 20:46    Procedures Procedures (including critical care time)  Medications Ordered in ED Medications  sodium chloride flush (NS) 0.9 % injection 3 mL (3 mLs Intravenous Not Given 08/15/19 1750)  cefTRIAXone (ROCEPHIN) 2 g in sodium chloride 0.9 % 100 mL IVPB (2 g Intravenous New Bag/Given 08/15/19 2131)    And  metroNIDAZOLE (FLAGYL) IVPB 500 mg (has no administration in time range)  ondansetron (ZOFRAN) injection 4 mg (4 mg Intravenous Given 08/15/19 1755)  morphine 4 MG/ML injection 4 mg (4 mg Intravenous Given 08/15/19 1755)  sodium chloride 0.9 % bolus 1,000 mL (0 mLs Intravenous Stopped 08/15/19 1942)  HYDROmorphone (DILAUDID) injection 1 mg (1 mg Intravenous Given 08/15/19 1832)  iohexol (OMNIPAQUE) 300 MG/ML solution 100 mL (100 mLs Intravenous Contrast Given 08/15/19 2036)     Initial Impression / Assessment and Plan / ED Course  I have reviewed the triage vital signs and the nursing notes.  Pertinent labs & imaging results that were available during my care of the patient were reviewed by me and considered in my medical decision making (see chart for details).  Clinical Course as of Aug 14 2140  Fri Aug 14, 4874  [id number]  48 year old female with right lower quadrant pain onset this morning.  On exam patient has generalized abdominal tenderness, worse in the right lower quadrant with rebound tenderness present.  Review of lab work shows leukocytosis white count 13,000, urinalysis is hazy with 5 ketones, CMP without significant changes.  CT with acute appendicitis without perforation. Case discussed with Dr. Arnoldo Morale with general surgery who has reviewed the CT report and requests rapid Covid test for planned surgery tonight.  Request made to [facility] for rapid Covid test, easy to order test.  Rocephin and Flagyl ordered for antibiotics while awaiting surgery.  Discussed findings  and plan of care with patient and her husband.   [LM]    Clinical Course User Index [LM] Tacy Learn, PA-C      Final Clinical Impressions(s) / ED Diagnoses   Final diagnoses:  Acute appendicitis with localized peritonitis, without perforation, abscess, or gangrene    ED Discharge Orders    None       Roque Lias 08/15/19 2142    Elnora Morrison, MD 08/15/19 2350

## 2019-08-15 NOTE — ED Triage Notes (Signed)
Pain to RLQ since this morning

## 2019-08-15 NOTE — ED Notes (Signed)
AC notified SARS negative.

## 2019-08-15 NOTE — ED Notes (Signed)
Pt return from CT.

## 2019-08-15 NOTE — Anesthesia Preprocedure Evaluation (Addendum)
Anesthesia Evaluation  Patient identified by MRN, date of birth, ID band Patient awake    Reviewed: Allergy & Precautions, NPO status , Patient's Chart, lab work & pertinent test results  Airway Mallampati: II  TM Distance: >3 FB Neck ROM: Full    Dental no notable dental hx. (+) Teeth Intact   Pulmonary asthma , former smoker,    Pulmonary exam normal breath sounds clear to auscultation       Cardiovascular Exercise Tolerance: Good negative cardio ROS Normal cardiovascular examI Rhythm:Regular Rate:Normal     Neuro/Psych PSYCHIATRIC DISORDERS  Neuromuscular disease    GI/Hepatic negative GI ROS, Neg liver ROS,   Endo/Other  negative endocrine ROS  Renal/GU negative Renal ROS  negative genitourinary   Musculoskeletal  (+) Arthritis , Osteoarthritis,    Abdominal   Peds negative pediatric ROS (+)  Hematology negative hematology ROS (+)   Anesthesia Other Findings   Reproductive/Obstetrics negative OB ROS                            Anesthesia Physical Anesthesia Plan  ASA: II and emergent  Anesthesia Plan: General   Post-op Pain Management:    Induction: Intravenous  PONV Risk Score and Plan: 3 and Midazolam, Ondansetron, Dexamethasone and Treatment may vary due to age or medical condition  Airway Management Planned: Oral ETT  Additional Equipment:   Intra-op Plan:   Post-operative Plan: Extubation in OR  Informed Consent: I have reviewed the patients History and Physical, chart, labs and discussed the procedure including the risks, benefits and alternatives for the proposed anesthesia with the patient or authorized representative who has indicated his/her understanding and acceptance.     Dental advisory given  Plan Discussed with:   Anesthesia Plan Comments: (Plan Full PPE use Plan GETA D/W PT -WTP with same after Q&A)        Anesthesia Quick Evaluation

## 2019-08-16 DIAGNOSIS — K353 Acute appendicitis with localized peritonitis, without perforation or gangrene: Secondary | ICD-10-CM | POA: Diagnosis not present

## 2019-08-16 DIAGNOSIS — Z9049 Acquired absence of other specified parts of digestive tract: Secondary | ICD-10-CM

## 2019-08-16 LAB — CBC
HCT: 40.7 % (ref 36.0–46.0)
Hemoglobin: 13 g/dL (ref 12.0–15.0)
MCH: 30.4 pg (ref 26.0–34.0)
MCHC: 31.9 g/dL (ref 30.0–36.0)
MCV: 95.3 fL (ref 80.0–100.0)
Platelets: 290 10*3/uL (ref 150–400)
RBC: 4.27 MIL/uL (ref 3.87–5.11)
RDW: 13.6 % (ref 11.5–15.5)
WBC: 19.7 10*3/uL — ABNORMAL HIGH (ref 4.0–10.5)
nRBC: 0 % (ref 0.0–0.2)

## 2019-08-16 LAB — BASIC METABOLIC PANEL
Anion gap: 11 (ref 5–15)
BUN: 13 mg/dL (ref 6–20)
CO2: 24 mmol/L (ref 22–32)
Calcium: 8.7 mg/dL — ABNORMAL LOW (ref 8.9–10.3)
Chloride: 104 mmol/L (ref 98–111)
Creatinine, Ser: 0.79 mg/dL (ref 0.44–1.00)
GFR calc Af Amer: >60 mL/min (ref >60)
GFR calc non Af Amer: >60 mL/min (ref >60)
Glucose, Bld: 166 mg/dL — ABNORMAL HIGH (ref 70–99)
Potassium: 4.5 mmol/L (ref 3.5–5.1)
Sodium: 139 mmol/L (ref 135–145)

## 2019-08-16 LAB — MRSA PCR SCREENING: MRSA by PCR: NEGATIVE

## 2019-08-16 MED ORDER — LACTATED RINGERS IV SOLN: INTRAVENOUS | Status: DC

## 2019-08-16 MED ORDER — ROCURONIUM BROMIDE 100 MG/10ML IV SOLN
INTRAVENOUS | Status: DC | PRN
  Administered 2019-08-16: 35 mg via INTRAVENOUS

## 2019-08-16 MED ORDER — HYDROCODONE-ACETAMINOPHEN 5-325 MG PO TABS: 1.0000 | ORAL_TABLET | ORAL | 0 refills | Status: DC | PRN

## 2019-08-16 MED ORDER — KETOROLAC TROMETHAMINE 30 MG/ML IJ SOLN
30.0000 mg | Freq: Four times a day (QID) | INTRAMUSCULAR | Status: AC
  Administered 2019-08-16: 30 mg via INTRAVENOUS
  Filled 2019-08-16: qty 1

## 2019-08-16 MED ORDER — BUDESONIDE 0.5 MG/2ML IN SUSP
0.5000 mg | Freq: Two times a day (BID) | RESPIRATORY_TRACT | Status: DC
  Administered 2019-08-16: 0.5 mg via RESPIRATORY_TRACT
  Filled 2019-08-16: qty 2

## 2019-08-16 MED ORDER — IPRATROPIUM-ALBUTEROL 0.5-2.5 (3) MG/3ML IN SOLN: 3.0000 mL | RESPIRATORY_TRACT | Status: DC | PRN

## 2019-08-16 MED ORDER — DIPHENHYDRAMINE HCL 50 MG/ML IJ SOLN: 25.0000 mg | Freq: Four times a day (QID) | INTRAMUSCULAR | Status: DC | PRN

## 2019-08-16 MED ORDER — ACETAMINOPHEN 650 MG RE SUPP: 650.0000 mg | Freq: Four times a day (QID) | RECTAL | Status: DC | PRN

## 2019-08-16 MED ORDER — KETOROLAC TROMETHAMINE 30 MG/ML IJ SOLN
30.0000 mg | Freq: Four times a day (QID) | INTRAMUSCULAR | Status: DC | PRN
  Administered 2019-08-16: 30 mg via INTRAVENOUS
  Filled 2019-08-16: qty 1

## 2019-08-16 MED ORDER — CHLORHEXIDINE GLUCONATE CLOTH 2 % EX PADS: 6.0000 | MEDICATED_PAD | Freq: Once | CUTANEOUS | Status: DC

## 2019-08-16 MED ORDER — FLUTICASONE PROPIONATE HFA 110 MCG/ACT IN AERO: 1.0000 | INHALATION_SPRAY | Freq: Two times a day (BID) | RESPIRATORY_TRACT | Status: DC

## 2019-08-16 MED ORDER — HYDROMORPHONE HCL 1 MG/ML IJ SOLN: 1.0000 mg | INTRAMUSCULAR | Status: DC | PRN

## 2019-08-16 MED ORDER — HYDROCODONE-ACETAMINOPHEN 7.5-325 MG PO TABS: 1.0000 | ORAL_TABLET | Freq: Once | ORAL | Status: DC | PRN

## 2019-08-16 MED ORDER — FLUTICASONE PROPIONATE HFA 110 MCG/ACT IN AERO
1.0000 | INHALATION_SPRAY | Freq: Two times a day (BID) | RESPIRATORY_TRACT | Status: DC
  Filled 2019-08-16: qty 12

## 2019-08-16 MED ORDER — PROMETHAZINE HCL 25 MG/ML IJ SOLN: 6.2500 mg | INTRAMUSCULAR | Status: DC | PRN

## 2019-08-16 MED ORDER — ONDANSETRON HCL 4 MG/2ML IJ SOLN: 4.0000 mg | Freq: Four times a day (QID) | INTRAMUSCULAR | Status: DC | PRN

## 2019-08-16 MED ORDER — ONDANSETRON 4 MG PO TBDP: 4.0000 mg | ORAL_TABLET | Freq: Four times a day (QID) | ORAL | Status: DC | PRN

## 2019-08-16 MED ORDER — ACETAMINOPHEN 325 MG PO TABS: 650.0000 mg | ORAL_TABLET | Freq: Four times a day (QID) | ORAL | Status: DC | PRN

## 2019-08-16 MED ORDER — SODIUM CHLORIDE 0.9 % IV SOLN
INTRAVENOUS | Status: DC
  Administered 2019-08-16: 02:00:00 via INTRAVENOUS

## 2019-08-16 MED ORDER — AMOXICILLIN-POT CLAVULANATE 875-125 MG PO TABS: 1.0000 | ORAL_TABLET | Freq: Two times a day (BID) | ORAL | 0 refills | Status: DC

## 2019-08-16 MED ORDER — ENOXAPARIN SODIUM 40 MG/0.4ML ~~LOC~~ SOLN: 40.0000 mg | SUBCUTANEOUS | Status: DC

## 2019-08-16 MED ORDER — SODIUM CHLORIDE 0.9 % IR SOLN
Status: DC | PRN
  Administered 2019-08-16: 1000 mL

## 2019-08-16 MED ORDER — MIDAZOLAM HCL 2 MG/2ML IJ SOLN
INTRAMUSCULAR | Status: DC | PRN
  Administered 2019-08-15: 2 mg via INTRAVENOUS

## 2019-08-16 MED ORDER — ALBUTEROL SULFATE HFA 108 (90 BASE) MCG/ACT IN AERS: 1.0000 | INHALATION_SPRAY | RESPIRATORY_TRACT | Status: DC | PRN

## 2019-08-16 MED ORDER — DEXAMETHASONE SODIUM PHOSPHATE 10 MG/ML IJ SOLN
INTRAMUSCULAR | Status: DC | PRN
  Administered 2019-08-16: 4 mg via INTRAVENOUS

## 2019-08-16 MED ORDER — SUGAMMADEX SODIUM 500 MG/5ML IV SOLN
INTRAVENOUS | Status: DC | PRN
  Administered 2019-08-16: 199.6 mg via INTRAVENOUS

## 2019-08-16 MED ORDER — MIDAZOLAM HCL 2 MG/2ML IJ SOLN: 0.5000 mg | Freq: Once | INTRAMUSCULAR | Status: DC | PRN

## 2019-08-16 MED ORDER — OXYCODONE-ACETAMINOPHEN 5-325 MG PO TABS: 1.0000 | ORAL_TABLET | ORAL | Status: DC | PRN

## 2019-08-16 MED ORDER — FENTANYL CITRATE (PF) 100 MCG/2ML IJ SOLN
INTRAMUSCULAR | Status: DC | PRN
  Administered 2019-08-15: 100 ug via INTRAVENOUS
  Administered 2019-08-16 (×2): 75 ug via INTRAVENOUS

## 2019-08-16 MED ORDER — SUCCINYLCHOLINE CHLORIDE 20 MG/ML IJ SOLN
INTRAMUSCULAR | Status: DC | PRN
  Administered 2019-08-15: 120 mg via INTRAVENOUS

## 2019-08-16 MED ORDER — ONDANSETRON HCL 4 MG/2ML IJ SOLN
INTRAMUSCULAR | Status: DC | PRN
  Administered 2019-08-16: 4 mg via INTRAVENOUS

## 2019-08-16 MED ORDER — VENLAFAXINE HCL ER 37.5 MG PO CP24
37.5000 mg | ORAL_CAPSULE | Freq: Every day | ORAL | Status: DC
  Filled 2019-08-16 (×3): qty 1

## 2019-08-16 MED ORDER — SODIUM CHLORIDE 0.9 % IV SOLN
2.0000 g | INTRAVENOUS | Status: DC
  Filled 2019-08-16: qty 20

## 2019-08-16 MED ORDER — FENTANYL CITRATE (PF) 100 MCG/2ML IJ SOLN
INTRAMUSCULAR | Status: AC
  Filled 2019-08-16: qty 2

## 2019-08-16 MED ORDER — LACTATED RINGERS IV SOLN
INTRAVENOUS | Status: DC | PRN
  Administered 2019-08-15: via INTRAVENOUS

## 2019-08-16 MED ORDER — LORAZEPAM 2 MG/ML IJ SOLN: 1.0000 mg | INTRAMUSCULAR | Status: DC | PRN

## 2019-08-16 MED ORDER — METRONIDAZOLE IN NACL 5-0.79 MG/ML-% IV SOLN
500.0000 mg | Freq: Three times a day (TID) | INTRAVENOUS | Status: DC
  Administered 2019-08-16: 500 mg via INTRAVENOUS
  Filled 2019-08-16: qty 100

## 2019-08-16 MED ORDER — DIPHENHYDRAMINE HCL 25 MG PO CAPS: 25.0000 mg | ORAL_CAPSULE | Freq: Four times a day (QID) | ORAL | Status: DC | PRN

## 2019-08-16 MED ORDER — HYDROMORPHONE HCL 1 MG/ML IJ SOLN
INTRAMUSCULAR | Status: AC
  Filled 2019-08-16: qty 0.5

## 2019-08-16 MED ORDER — HYDROMORPHONE HCL 1 MG/ML IJ SOLN
0.2500 mg | INTRAMUSCULAR | Status: DC | PRN
  Administered 2019-08-16 (×2): 0.5 mg via INTRAVENOUS
  Filled 2019-08-16: qty 0.5

## 2019-08-16 MED ORDER — SIMETHICONE 80 MG PO CHEW: 40.0000 mg | CHEWABLE_TABLET | Freq: Four times a day (QID) | ORAL | Status: DC | PRN

## 2019-08-16 NOTE — Anesthesia Procedure Notes (Signed)
Procedure Name: Intubation Date/Time: 08/16/2019 11:55 PM Performed by: Lenice Llamas, MD Pre-anesthesia Checklist: Patient identified, Patient being monitored, Timeout performed, Emergency Drugs available and Suction available Patient Re-evaluated:Patient Re-evaluated prior to induction Oxygen Delivery Method: Circle System Utilized Preoxygenation: Pre-oxygenation with 100% oxygen Induction Type: IV induction, Rapid sequence and Cricoid Pressure applied Ventilation: Mask ventilation without difficulty Laryngoscope Size: Mac and 4 Grade View: Grade I Tube type: Oral Tube size: 7.5 mm Number of attempts: 1 Airway Equipment and Method: Stylet Placement Confirmation: ETT inserted through vocal cords under direct vision,  positive ETCO2 and breath sounds checked- equal and bilateral Secured at: 23 cm Tube secured with: Tape Dental Injury: Teeth and Oropharynx as per pre-operative assessment

## 2019-08-16 NOTE — Addendum Note (Signed)
Addendum  created 08/16/19 0241 by Lenice Llamas, MD   Intraprocedure Meds edited

## 2019-08-16 NOTE — Progress Notes (Signed)
Ambulation to the toilet and back to bed was completed with minimal assistance and no ill affects. 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

## 2019-08-16 NOTE — Progress Notes (Signed)
Pt is A&O, VSS.  Pt is ambulatory without assistance and tolerated breakfast.  Abd sites are clean and dry.  Pt educated on signs and symptoms of infection and when to call doctor.  She verbalizes understanding.

## 2019-08-16 NOTE — Op Note (Signed)
Patient:  Beverly Wright  DOB:  1971-03-06  MRN:  [id number]   Preop Diagnosis: Acute appendicitis  Postop Diagnosis: Same  Procedure: Laparoscopic appendectomy  Surgeon: Aviva Signs, MD  Anes: General endotracheal  Indications: Patient is a 48 year old white female who presents with right lower quadrant abdominal pain.  CT scan of the abdomen reveals acute appendicitis with appendicoliths.  The risks and benefits of the procedure including bleeding, infection, and the possibility of an open procedure were fully explained to the patient, who gave informed consent.  Procedure note: The patient was placed in the supine position.  After induction of general endotracheal anesthesia, the abdomen was prepped and draped using the usual sterile technique with ChloraPrep.  Surgical site confirmation was performed.  A supraumbilical incision was made down to the fascia.  A Veress needle was introduced into the abdominal cavity and confirmation of placement was done using the saline drop test.  The abdomen was then insufflated to 15 mmHg pressure.  An 11 mm trocar was introduced into the abdominal cavity under direct visualization without difficulty.  The patient was placed in deeper Trendelenburg position and an additional 12 mm trocar was placed in the suprapubic region and a 5 mm trocar was placed in the left lower quadrant region.  The appendix was visualized and noted to be acutely inflamed throughout.  The mesoappendix was divided using the harmonic scalpel.  A small abscess cavity was present in the mesentery.  This was aspirated.  A standard Endo GIA was placed across the base of the appendix and fired.  A small panel was noted outside the lumen of the appendix but it was retrieved along with the appendix using an Endo Catch bag.  The staple line was inspected noted to be within normal limits.  All fluid and air were then evacuated from the abdominal cavity prior to removal of the trochars.  All  wounds were irrigated with normal saline.  All wounds were injected with Exparel.  The supraumbilical fascia was reapproximated using an 0 Vicryl interrupted suture.  All skin incisions were closed using a 4-0 Monocryl subcuticular suture.  Dermabond was applied.  All tape and needle counts were correct at the end of the procedure.  Patient was extubated in the operating room and transferred to PACU in stable condition.  Complications: None  EBL: Minimal  Specimen: Appendix

## 2019-08-16 NOTE — Discharge Instructions (Signed)
Laparoscopic Appendectomy, Adult, Care After  This sheet gives you information about how to care for yourself after your procedure. Your health care provider may also give you more specific instructions. If you have problems or questions, contact your health care provider.  What can I expect after the procedure?  After the procedure, it is common to have:  · Little energy for normal activities.  · Mild pain in the area where the incisions were made.  · Difficulty passing stool (constipation). This can be caused by:  ? Pain medicine.  ? A decrease in your activity.  Follow these instructions at home:  Medicines  · Take over-the-counter and prescription medicines only as told by your health care provider.  · If you were prescribed an antibiotic medicine, take it as told by your health care provider. Do not stop taking the antibiotic even if you start to feel better.  · Do not drive or use heavy machinery while taking prescription pain medicine.  · Ask your health care provider if the medicine prescribed to you can cause constipation. You may need to take steps to prevent or treat constipation, such as:  ? Drink enough fluid to keep your urine pale yellow.  ? Take over-the-counter or prescription medicines.  ? Eat foods that are high in fiber, such as beans, whole grains, and fresh fruits and vegetables.  ? Limit foods that are high in fat and processed sugars, such as fried or sweet foods.  Incision care    · Follow instructions from your health care provider about how to take care of your incisions. Make sure you:  ? Wash your hands with soap and water before and after you change your bandage (dressing). If soap and water are not available, use hand sanitizer.  ? Change your dressing as told by your health care provider.  ? Leave stitches (sutures), skin glue, or adhesive strips in place. These skin closures may need to stay in place for 2 weeks or longer. If adhesive strip edges start to loosen and curl up, you may  trim the loose edges. Do not remove adhesive strips completely unless your health care provider tells you to do that.  · Check your incision areas every day for signs of infection. Check for:  ? Redness, swelling, or pain.  ? Fluid or blood.  ? Warmth.  ? Pus or a bad smell.  Bathing  · Keep your incisions clean and dry. Clean them as often as told by your health care provider. To do this:  1. Gently wash the incisions with soap and water.  2. Rinse the incisions with water to remove all soap.  3. Pat the incisions dry with a clean towel. Do not rub the incisions.  · Do not take baths, swim, or use a hot tub for 2 weeks, or until your health care provider approves. You may take showers after 48 hours.  Activity    · Do not drive for 24 hours if you were given a sedative during your procedure.  · Rest after the procedure. Return to your normal activities as told by your health care provider. Ask your health care provider what activities are safe for you.  · For 3 weeks, or for as long as told by your health care provider:  ? Do not lift anything that is heavier than 10 lb (4.5 kg), or the limit that you are told.  ? Do not play contact sports.  General instructions  · If you   care provider. This is important. °Contact a health care provider if: °· You have redness, swelling, or pain around an incision. °· You have fluid or blood coming from an incision. °· Your incision feels warm to the touch. °· You have pus or a bad smell coming from an incision or dressing. °· Your incision edges break open after your sutures have been removed. °· You have increasing pain in your shoulders. °· You feel dizzy or you faint. °· You develop shortness of breath. °· You keep feeling  nauseous or you are vomiting. °· You have diarrhea or you cannot control your bowel functions. °· You lose your appetite. °· You develop swelling or pain in your legs. °· You develop a rash. °Get help right away if you have: °· A fever. °· Difficulty breathing. °· Sharp pains in your chest. °Summary °· After a laparoscopic appendectomy, it is common to have little energy for normal activities, mild pain in the area of the incisions, and constipation. °· Infection is the most common complication after this procedure. Follow your health care provider's instructions about caring for yourself after the procedure. °· Rest after the procedure. Return to your normal activities as told by your health care provider. °· Contact your health care provider if you notice signs of infection around your incisions or you develop shortness of breath. Get help right away if you have a fever, chest pain, or difficulty breathing. °This information is not intended to replace advice given to you by your health care provider. Make sure you discuss any questions you have with your health care provider. °Document Released: 09/25/2005 Document Revised: 03/28/2018 Document Reviewed: 03/28/2018 °Elsevier Patient Education © 2020 [employer] ° °

## 2019-08-16 NOTE — Discharge Summary (Signed)
Physician Discharge Summary  Patient ID: Beverly Wright MRN: [id number] DOB/AGE: 04-28-1971 48 y.o.  Admit date: 08/15/2019 Discharge date: 08/16/2019  Admission Diagnoses: Acute appendicitis  Discharge Diagnoses: Same Active Problems:   Acute appendicitis with localized peritonitis, without perforation, abscess, or gangrene   S/P laparoscopic appendectomy   Discharged Condition: good  Hospital Course: Patient is a 48 year old white female who presented to the emergency room with right lower quadrant abdominal pain.  CT scan of the abdomen revealed acute appendicitis.  Patient was taken to the operating room on 08/15/2019 and underwent laparoscopic appendectomy.  She tolerated the procedure well.  Her postoperative course has been unremarkable.  Her diet was advanced out difficulty.  She was noted to have a leukocytosis after surgery which is not unexpected.  She will be placed on Augmentin for 5 days.  She is being discharged home on 08/16/2019 in good and improving condition.  Treatments: surgery: Laparoscopic appendectomy on 08/15/2019  Discharge Exam: Blood pressure 100/66, pulse 74, temperature 98.7 F (37.1 C), temperature source Oral, resp. rate 12, height 5\' 7"  (1.702 m), weight 99.8 kg, last menstrual period 11/10/2016, SpO2 96 %. General appearance: alert, cooperative and no distress Resp: clear to auscultation bilaterally Cardio: regular rate and rhythm, S1, S2 normal, no murmur, click, rub or gallop GI: Soft, incisions healing well.  Disposition: Discharge disposition: 01-Home or Self Care       Discharge Instructions    Diet - low sodium heart healthy   Complete by: As directed    Increase activity slowly   Complete by: As directed      Allergies as of 08/16/2019   No Known Allergies     Medication List    TAKE these medications   albuterol 108 (90 Base) MCG/ACT inhaler Commonly known as: ProAir HFA INHALE 2 PUFFS INTO THE LUNGS EVERY 4 (FOUR) HOURS AS  NEEDED FOR WHEEZING OR SHORTNESS OF BREATH.   amoxicillin-clavulanate 875-125 MG tablet Commonly known as: Augmentin Take 1 tablet by mouth 2 (two) times daily.   Flovent HFA 110 MCG/ACT inhaler Generic drug: fluticasone Inhale 1-2 puffs into the lungs 2 (two) times daily.   HYDROcodone-acetaminophen 5-325 MG tablet Commonly known as: Norco Take 1 tablet by mouth every 4 (four) hours as needed for moderate pain.   ibuprofen 200 MG tablet Commonly known as: ADVIL Take 400-600 mg by mouth every 6 (six) hours as needed for mild pain or moderate pain.   venlafaxine XR 37.5 MG 24 hr capsule Commonly known as: Effexor XR Take 1 capsule (37.5 mg total) by mouth daily with breakfast. For hot flashes   vitamin B-12 500 MCG tablet Commonly known as: CYANOCOBALAMIN Take 500 mcg by mouth daily.   VITAMIN D PO Take 1 capsule by mouth daily.      Follow-up Information    Aviva Signs, MD Follow up.   Specialty: General Surgery Why: Will call you for virtual follow up later this week Contact information: [address] [place] [id number]           Signed: Aviva Signs 08/16/2019, 8:37 AM

## 2019-08-16 NOTE — Anesthesia Postprocedure Evaluation (Signed)
Anesthesia Post Note  Patient: Beverly Wright  Procedure(s) Performed: APPENDECTOMY LAPAROSCOPIC (N/A Abdomen)  Patient location during evaluation: PACU Anesthesia Type: General Level of consciousness: awake, oriented and sedated Pain management: pain level controlled Vital Signs Assessment: post-procedure vital signs reviewed and stable Respiratory status: spontaneous breathing, nonlabored ventilation and patient connected to nasal cannula oxygen Cardiovascular status: blood pressure returned to baseline and stable Postop Assessment: no headache     Last Vitals:  Vitals:   08/15/19 1723 08/15/19 2133  BP: (!) 153/96 (!) 146/80  Pulse: (!) 101 (!) 110  Resp: 18 (!) 22  Temp: 37.2 C   SpO2: 99% 100%    Last Pain:  Vitals:   08/15/19 2242  TempSrc:   PainSc: 0-No pain                 Talbert [place] Lexie Morini

## 2019-08-16 NOTE — Transfer of Care (Signed)
Immediate Anesthesia Transfer of Care Note  Patient: Beverly Wright  Procedure(s) Performed: APPENDECTOMY LAPAROSCOPIC (N/A Abdomen)  Patient Location: PACU  Anesthesia Type:General  Level of Consciousness: awake and oriented  Airway & Oxygen Therapy: Patient Spontanous Breathing and Patient connected to nasal cannula oxygen  Post-op Assessment: Report given to RN and Post -op Vital signs reviewed and stable  Post vital signs: Reviewed  Last Vitals:  Vitals Value Taken Time  BP    Temp    Pulse    Resp    SpO2      Last Pain:  Vitals:   08/15/19 2242  TempSrc:   PainSc: 0-No pain         Complications: No apparent anesthesia complications

## 2019-08-18 ENCOUNTER — Other Ambulatory Visit: Payer: Self-pay

## 2019-08-18 ENCOUNTER — Encounter (HOSPITAL_COMMUNITY): Payer: Self-pay | Admitting: Emergency Medicine

## 2019-08-18 ENCOUNTER — Telehealth: Payer: Self-pay

## 2019-08-18 ENCOUNTER — Emergency Department (HOSPITAL_COMMUNITY): Payer: PRIVATE HEALTH INSURANCE

## 2019-08-18 ENCOUNTER — Emergency Department (HOSPITAL_COMMUNITY)
Admission: EM | Admit: 2019-08-18 | Discharge: 2019-08-18 | Disposition: A | Discharge status: Home / Self Care | Payer: PRIVATE HEALTH INSURANCE | Attending: Emergency Medicine | Admitting: Emergency Medicine

## 2019-08-18 DIAGNOSIS — R1030 Lower abdominal pain, unspecified: Secondary | ICD-10-CM | POA: Diagnosis present

## 2019-08-18 DIAGNOSIS — Z79899 Other long term (current) drug therapy: Secondary | ICD-10-CM | POA: Insufficient documentation

## 2019-08-18 DIAGNOSIS — G8918 Other acute postprocedural pain: Secondary | ICD-10-CM | POA: Diagnosis not present

## 2019-08-18 DIAGNOSIS — Z9089 Acquired absence of other organs: Secondary | ICD-10-CM | POA: Insufficient documentation

## 2019-08-18 DIAGNOSIS — R197 Diarrhea, unspecified: Secondary | ICD-10-CM | POA: Diagnosis not present

## 2019-08-18 DIAGNOSIS — Z87891 Personal history of nicotine dependence: Secondary | ICD-10-CM | POA: Diagnosis not present

## 2019-08-18 LAB — CBC WITH DIFFERENTIAL/PLATELET
Abs Immature Granulocytes: 0.03 10*3/uL (ref 0.00–0.07)
Basophils Absolute: 0 10*3/uL (ref 0.0–0.1)
Basophils Relative: 0 %
Eosinophils Absolute: 0.4 10*3/uL (ref 0.0–0.5)
Eosinophils Relative: 4 %
HCT: 37.2 % (ref 36.0–46.0)
Hemoglobin: 12.1 g/dL (ref 12.0–15.0)
Immature Granulocytes: 0 %
Lymphocytes Relative: 16 %
Lymphs Abs: 1.6 10*3/uL (ref 0.7–4.0)
MCH: 30.6 pg (ref 26.0–34.0)
MCHC: 32.5 g/dL (ref 30.0–36.0)
MCV: 93.9 fL (ref 80.0–100.0)
Monocytes Absolute: 0.8 10*3/uL (ref 0.1–1.0)
Monocytes Relative: 8 %
Neutro Abs: 7.1 10*3/uL (ref 1.7–7.7)
Neutrophils Relative %: 72 %
Platelets: 299 10*3/uL (ref 150–400)
RBC: 3.96 MIL/uL (ref 3.87–5.11)
RDW: 13.2 % (ref 11.5–15.5)
WBC: 10 10*3/uL (ref 4.0–10.5)
nRBC: 0 % (ref 0.0–0.2)

## 2019-08-18 LAB — BASIC METABOLIC PANEL
Anion gap: 9 (ref 5–15)
BUN: 12 mg/dL (ref 6–20)
CO2: 29 mmol/L (ref 22–32)
Calcium: 8.8 mg/dL — ABNORMAL LOW (ref 8.9–10.3)
Chloride: 102 mmol/L (ref 98–111)
Creatinine, Ser: 0.65 mg/dL (ref 0.44–1.00)
GFR calc Af Amer: >60 mL/min (ref >60)
GFR calc non Af Amer: >60 mL/min (ref >60)
Glucose, Bld: 78 mg/dL (ref 70–99)
Potassium: 3.6 mmol/L (ref 3.5–5.1)
Sodium: 140 mmol/L (ref 135–145)

## 2019-08-18 LAB — POC OCCULT BLOOD, ED: Fecal Occult Bld: NEGATIVE

## 2019-08-18 MED ORDER — ONDANSETRON HCL 4 MG/2ML IJ SOLN
4.0000 mg | Freq: Once | INTRAMUSCULAR | Status: AC
  Administered 2019-08-18: 4 mg via INTRAVENOUS
  Filled 2019-08-18: qty 2

## 2019-08-18 MED ORDER — HYDROMORPHONE HCL 1 MG/ML IJ SOLN
0.5000 mg | Freq: Once | INTRAMUSCULAR | Status: AC
  Administered 2019-08-18: 0.5 mg via INTRAVENOUS
  Filled 2019-08-18: qty 1

## 2019-08-18 MED ORDER — SODIUM CHLORIDE 0.9 % IV SOLN: 1000.0000 mL | INTRAVENOUS | Status: DC

## 2019-08-18 MED ORDER — SODIUM CHLORIDE 0.9 % IV BOLUS (SEPSIS)
1000.0000 mL | Freq: Once | INTRAVENOUS | Status: AC
  Administered 2019-08-18: 1000 mL via INTRAVENOUS

## 2019-08-18 NOTE — ED Notes (Signed)
Dr. Arnoldo Morale in room

## 2019-08-18 NOTE — Telephone Encounter (Signed)
Patient was in the hospital for appendectomy and then went back to ER today. Do you want patient to follow up here or just surgeon? Let me know, thank you

## 2019-08-18 NOTE — Discharge Instructions (Addendum)
Your lab work is within normal limits.  Your vital signs are within normal limits.  Your stool is negative for gross bleeding or hidden blood at this time.  Your pain is probably related to one of the incisions to your rectus muscle during your appendix procedure.  Please continue your current medications.  Please see Dr. Arnoldo Morale for recheck or follow-up if not improving.

## 2019-08-18 NOTE — ED Provider Notes (Signed)
[facility] EMERGENCY DEPARTMENT Provider Note   CSN: [id number] Arrival date & time: 08/18/19  [id number]     History   Chief Complaint Chief Complaint  Patient presents with  . Post-op Problem  . GI Bleeding    HPI Lee-Ann Cavazos is a 48 y.o. female.     Patient is a 48 year old female who presents to the emergency department with complaint of complications following surgery.  The patient states that she had her appendix removed on November 6.  She was discharged home and after being at home she started having diarrhea.  She also started noticing her stool was black in color.  She had pain in her right flank as well as in her right lower abdomen.  She felt as though she had orthostatic changes when she change positions at home.  No vomiting reported.  No high fever reported.  It is of note that the patient states that her usual temperature is low, and even though she has been having a temperature of 98 and 99 she says this is elevated for her.  She presents now for assistance with this issue.    The history is provided by the patient.    Past Medical History:  Diagnosis Date  . Asthma   . Attention deficit hyperactivity disorder (ADHD) 02/05/2015   dx age 62   . Back pain     Patient Active Problem List   Diagnosis Date Noted  . S/P laparoscopic appendectomy 08/16/2019  . Acute appendicitis with localized peritonitis, without perforation, abscess, or gangrene   . Peripheral edema 12/28/2017  . Prediabetes 12/28/2017  . Vasomotor symptoms due to menopause 12/28/2017  . Change in consistency of stool 12/28/2017  . Facet arthropathy, lumbar 12/07/2016  . Spondylosis without myelopathy or radiculopathy, lumbar region 09/13/2016  . Lumbar discogenic pain syndrome 03/17/2016  . Preventative health care 03/01/2016  . DDD (degenerative disc disease), lumbar 02/10/2016  . Facet syndrome, lumbar 02/10/2016  . Lumbar radiculopathy 02/10/2016  . Asthma 11/25/2015  . Chronic low  back pain with bilateral sciatica 09/29/2015  . Attention deficit hyperactivity disorder (ADHD) 02/05/2015    Past Surgical History:  Procedure Laterality Date  . BIOPSY BREAST Right   . CHOLECYSTECTOMY    . HERNIA REPAIR       OB History    Gravida  5   Para  4   Term  3   Preterm  1   AB  1   Living  4     SAB  1   TAB      Ectopic      Multiple      Live Births               Home Medications    Prior to Admission medications   Medication Sig Start Date End Date Taking? Authorizing Provider  albuterol (PROAIR HFA) 108 (90 Base) MCG/ACT inhaler INHALE 2 PUFFS INTO THE LUNGS EVERY 4 (FOUR) HOURS AS NEEDED FOR WHEEZING OR SHORTNESS OF BREATH. 06/02/19  Yes Pleas Koch, NP  amoxicillin-clavulanate (AUGMENTIN) 875-125 MG tablet Take 1 tablet by mouth 2 (two) times daily. 08/16/19  Yes Aviva Signs, MD  Cholecalciferol (VITAMIN D PO) Take 1 capsule by mouth daily.   Yes [provider]  cyanocobalamin 500 MCG tablet Take 500 mcg by mouth daily.   Yes [provider]  fluticasone (FLOVENT HFA) 110 MCG/ACT inhaler Inhale 1-2 puffs into the lungs 2 (two) times daily. 06/02/19  Yes Carlis Abbott,  Leticia Penna, NP  HYDROcodone-acetaminophen (NORCO) 5-325 MG tablet Take 1 tablet by mouth every 4 (four) hours as needed for moderate pain. 08/16/19  Yes Aviva Signs, MD  venlafaxine XR (EFFEXOR XR) 37.5 MG 24 hr capsule Take 1 capsule (37.5 mg total) by mouth daily with breakfast. For hot flashes 05/02/19  Yes Pleas Koch, NP    Family History Family History  Adopted: Yes  Problem Relation Age of Onset  . Other Other        adopted    Social History Social History   Tobacco Use  . Smoking status: Former Smoker    Packs/day: 0.20    Years: 23.00    Pack years: 4.60    Types: Cigarettes    Quit date: 06/11/2017    Years since quitting: 2.1  . Smokeless tobacco: Never Used  Substance Use Topics  . Alcohol use: No    Alcohol/week: 0.0  standard drinks  . Drug use: No     Allergies   Patient has no known allergies.   Review of Systems Review of Systems  Constitutional: Negative for activity change and appetite change.  HENT: Negative for congestion, ear discharge, ear pain, facial swelling, nosebleeds, rhinorrhea, sneezing and tinnitus.   Eyes: Negative for photophobia, pain and discharge.  Respiratory: Negative for cough, choking, shortness of breath and wheezing.   Cardiovascular: Negative for chest pain, palpitations and leg swelling.  Gastrointestinal: Positive for abdominal pain and blood in stool. Negative for constipation, diarrhea, nausea and vomiting.  Genitourinary: Positive for flank pain. Negative for difficulty urinating, dysuria, frequency and hematuria.  Musculoskeletal: Negative for back pain, gait problem, myalgias and neck pain.  Skin: Negative for color change, rash and wound.  Neurological: Negative for dizziness, seizures, syncope, facial asymmetry, speech difficulty, weakness and numbness.  Hematological: Negative for adenopathy. Does not bruise/bleed easily.  Psychiatric/Behavioral: Negative for agitation, confusion, hallucinations, self-injury and suicidal ideas. The patient is not nervous/anxious.      Physical Exam Updated Vital Signs BP 116/76   Pulse 90   Temp 98.2 F (36.8 C) (Oral)   Resp 18   Wt 99.8 kg   LMP 11/10/2016   SpO2 98%   BMI 34.46 kg/m   Physical Exam Vitals signs and nursing note reviewed.  Constitutional:      Appearance: She is well-developed. She is not toxic-appearing.  HENT:     Head: Normocephalic.     Right Ear: Tympanic membrane and external ear normal.     Left Ear: Tympanic membrane and external ear normal.  Eyes:     General: Lids are normal.     Pupils: Pupils are equal, round, and reactive to light.  Neck:     Musculoskeletal: Normal range of motion and neck supple.     Vascular: No carotid bruit.  Cardiovascular:     Rate and Rhythm:  Normal rate and regular rhythm.     Pulses: Normal pulses.     Heart sounds: Normal heart sounds.  Pulmonary:     Effort: No respiratory distress.     Breath sounds: Normal breath sounds.  Abdominal:     General: Bowel sounds are normal.     Palpations: Abdomen is soft.     Tenderness: There is abdominal tenderness in the right lower quadrant. There is guarding. There is no right CVA tenderness or left CVA tenderness.  Musculoskeletal: Normal range of motion.  Lymphadenopathy:     Head:     Right side of head: No  submandibular adenopathy.     Left side of head: No submandibular adenopathy.     Cervical: No cervical adenopathy.  Skin:    General: Skin is warm and dry.  Neurological:     Mental Status: She is alert and oriented to person, place, and time.     Cranial Nerves: No cranial nerve deficit.     Sensory: No sensory deficit.  Psychiatric:        Speech: Speech normal.      ED Treatments / Results  Labs (all labs ordered are listed, but only abnormal results are displayed) Labs Reviewed  CBC WITH DIFFERENTIAL/PLATELET  BASIC METABOLIC PANEL  POC OCCULT BLOOD, ED  SAMPLE TO BLOOD BANK    EKG None  Radiology No results found.  Procedures Procedures (including critical care time)  Medications Ordered in ED Medications  sodium chloride 0.9 % bolus 1,000 mL (1,000 mLs Intravenous New Bag/Given 08/18/19 1129)    Followed by  0.9 %  sodium chloride infusion (has no administration in time range)  HYDROmorphone (DILAUDID) injection 0.5 mg (0.5 mg Intravenous Given 08/18/19 1129)  ondansetron (ZOFRAN) injection 4 mg (4 mg Intravenous Given 08/18/19 1128)     Initial Impression / Assessment and Plan / ED Course  I have reviewed the triage vital signs and the nursing notes.  Pertinent labs & imaging results that were available during my care of the patient were reviewed by me and considered in my medical decision making (see chart for details).          Final  Clinical Impressions(s) / ED Diagnoses MDM    Case discussed with Dr. Arnoldo Morale.  He is in agreement with work-up up to this point.  Stool for occult blood is negative.  Complete blood count and basic metabolic panel are within normal limits.  Chest x-ray is negative for acute changes.  No free air under the diaphragm. Patient feeling some better after IV fluids and pain medication.  12:35 PM.  Dr. Arnoldo Morale is in the department to see the patient.  Patient feeling much better after IV medicines and fluids.  Patient ambulatory with less pain.  Patient evaluated by Dr. Arnoldo Morale and can be discharged home.  He will follow her in the office.   Final diagnoses:  Post-operative pain    ED Discharge Orders    None       Lily Kocher, PA-C 08/18/19 1341    Milton Ferguson, MD 08/19/19 [phone]

## 2019-08-18 NOTE — ED Triage Notes (Signed)
Pt had her gall bladder removed over the weekend by dr Arnoldo Morale she is having diarrhea and it is black in color and she is still having pain in her abd since. She states that she is orthostatic at home.

## 2019-08-19 LAB — SURGICAL PATHOLOGY

## 2019-08-19 NOTE — Telephone Encounter (Signed)
Surgeon is fine, thanks.

## 2019-08-19 NOTE — Telephone Encounter (Signed)
Spoke with patient. She spoke with the surgeon yesterday-08/18/2019 due to having some RLQ pain still. She feels like it is a combination of gas pain and referred pain. This mainly happens when she tries to go to the bathroom. Surgeon reviewed her recent labs from ER visit and will follow up with her again Saturday but she was advised to call him back sooner if anything else comes up. Patient said thank you for following up with her.

## 2019-11-11 ENCOUNTER — Other Ambulatory Visit: Payer: Self-pay | Admitting: Primary Care

## 2019-11-11 DIAGNOSIS — N951 Menopausal and female climacteric states: Secondary | ICD-10-CM

## 2019-11-27 ENCOUNTER — Other Ambulatory Visit: Payer: Self-pay | Admitting: Primary Care

## 2019-11-27 DIAGNOSIS — J4530 Mild persistent asthma, uncomplicated: Secondary | ICD-10-CM

## 2019-11-28 ENCOUNTER — Other Ambulatory Visit: Payer: Self-pay | Admitting: Primary Care

## 2019-11-28 DIAGNOSIS — J4530 Mild persistent asthma, uncomplicated: Secondary | ICD-10-CM

## 2020-01-05 ENCOUNTER — Encounter: Payer: Self-pay | Admitting: Primary Care

## 2020-01-05 ENCOUNTER — Ambulatory Visit (INDEPENDENT_AMBULATORY_CARE_PROVIDER_SITE_OTHER): Payer: Self-pay | Admitting: Primary Care

## 2020-01-05 ENCOUNTER — Telehealth: Payer: Self-pay | Admitting: Primary Care

## 2020-01-05 DIAGNOSIS — J4541 Moderate persistent asthma with (acute) exacerbation: Secondary | ICD-10-CM

## 2020-01-05 DIAGNOSIS — J4520 Mild intermittent asthma, uncomplicated: Secondary | ICD-10-CM

## 2020-01-05 MED ORDER — PREDNISONE 20 MG PO TABS: ORAL_TABLET | ORAL | 0 refills | Status: DC

## 2020-01-05 MED ORDER — ALBUTEROL SULFATE (2.5 MG/3ML) 0.083% IN NEBU: 2.5000 mg | INHALATION_SOLUTION | Freq: Four times a day (QID) | RESPIRATORY_TRACT | 0 refills | Status: DC | PRN

## 2020-01-05 MED ORDER — GUAIFENESIN-CODEINE 100-10 MG/5ML PO SYRP: 5.0000 mL | ORAL_SOLUTION | Freq: Three times a day (TID) | ORAL | 0 refills | Status: DC | PRN

## 2020-01-05 MED ORDER — ALBUTEROL SULFATE HFA 108 (90 BASE) MCG/ACT IN AERS: INHALATION_SPRAY | RESPIRATORY_TRACT | 0 refills | Status: DC

## 2020-01-05 NOTE — Telephone Encounter (Signed)
Call routed to me from front desk associate. Patient's husband upset because spouse has to have virtual visit due to cough and congestion. I tried to explain the Covid policy for bringing patient into the office. Mr. Neyland expressed his frustration and stated patient will have virtual visit and hung up the phone.

## 2020-01-05 NOTE — Telephone Encounter (Signed)
Noted, patient evaluated. Patient understood reasoning behind virtual visit, she is in nursing school.

## 2020-01-05 NOTE — Assessment & Plan Note (Signed)
Acute symptoms of allergy, asthma, and potentially viral involvement.  She does appear to be having an asthma exacerbation, overall seems stable to manage at home.  Start with prednisone burst. Rx for nebulized albuterol sent to pharmacy along with nebulizer machine as HFA inhaler doesn't seem effective. Rx for Cheratussin to use PRN, drowsiness precautions provided.  Will have her remain quarantined at home for another week due to active symptoms. School note provided. She will update in a few days.

## 2020-01-05 NOTE — Progress Notes (Signed)
Subjective:    Patient ID: Beverly Wright, female    DOB: Dec 02, 1970, 49 y.o.   MRN: [id number]  HPI  Virtual Visit via Video Note  I connected with Beverly Wright on 01/05/20 at 11:20 AM EDT by a video enabled telemedicine application and verified that I am speaking with the correct person using two identifiers.  Location: Patient: Home Provider: Office   I discussed the limitations of evaluation and management by telemedicine and the availability of in person appointments. The patient expressed understanding and agreed to proceed.  History of Present Illness:  Beverly Wright is a 49 year old female with a history of asthma, ADHD, prediabetes who presents today with a chief complaint of cough.  Symptoms began with sneezing and "allergy symptoms" six days ago. Five days ago she began to have joint aches, then wheezing with shortness of breath, fatigue, sinus pressure. Cough began two days ago and is productive with green and brown sputum.   She's been taking Claritin D with temporary improvement, she's used up her albuterol inhaler. She is compliant to her Flovent twice daily.   She denies known exposure to Covid-19, has been quarantined for the last six days due to her symptoms. Has been very careful and is screening herself for Covid-19, is in nursing school. She denies diarrhea, fevers, loss of taste/smell. Her husband does not have symptoms.   Observations/Objective:  Alert and oriented. Tired. No distress. Speaking in complete sentences. Congested cough noted once during visit.  Assessment and Plan:  Acute symptoms of allergy, asthma, and potentially viral involvement.  She does appear to be having an asthma exacerbation, overall seems stable to manage at home.  Start with prednisone burst. Rx for nebulized albuterol sent to pharmacy along with nebulizer machine as HFA inhaler doesn't seem effective. Rx for Cheratussin to use PRN, drowsiness precautions  provided.  Will have her remain quarantined at home for another week due to active symptoms. School note provided. She will update in a few days.  Follow Up Instructions:  Start prednisone. Take 2 tablets daily for five days.  Use the albuterol every 6 hours as needed.  You may take the cough suppressant every 8 hours as needed for cough and rest. Caution this medication contains codeine which may cause drowsiness.   Please update me in a few days as discussed.  It was a pleasure to see you today! Allie Bossier, NP-C    I discussed the assessment and treatment plan with the patient. The patient was provided an opportunity to ask questions and all were answered. The patient agreed with the plan and demonstrated an understanding of the instructions.   The patient was advised to call back or seek an in-person evaluation if the symptoms worsen or if the condition fails to improve as anticipated.    Pleas Koch, NP    Review of Systems  Constitutional: Positive for fatigue. Negative for fever.  HENT: Positive for congestion, postnasal drip, rhinorrhea, sinus pressure and sneezing.   Respiratory: Positive for cough, shortness of breath and wheezing.   Allergic/Immunologic: Positive for environmental allergies.       Past Medical History:  Diagnosis Date  . Asthma   . Attention deficit hyperactivity disorder (ADHD) 02/05/2015   dx age 74   . Back pain      Social History   Socioeconomic History  . Marital status: Married    Spouse name: Not on file  . Number of children: Not on file  .  Years of education: Not on file  . Highest education level: Not on file  Occupational History  . Not on file  Tobacco Use  . Smoking status: Former Smoker    Packs/day: 0.20    Years: 23.00    Pack years: 4.60    Types: Cigarettes    Quit date: 06/11/2017    Years since quitting: 2.5  . Smokeless tobacco: Never Used  Substance and Sexual Activity  . Alcohol use: No     Alcohol/week: 0.0 standard drinks  . Drug use: No  . Sexual activity: Not on file  Other Topics Concern  . Not on file  Social History Narrative   Married.   Has custody of her grandson.   Works as a Programme researcher, broadcasting/film/video.   Also aspires to go to nursing school   Social Determinants of Health   Financial Resource Strain:   . Difficulty of Paying Living Expenses:   Food Insecurity:   . Worried About Charity fundraiser in the Last Year:   . Arboriculturist in the Last Year:   Transportation Needs:   . Film/video editor (Medical):   Marland Kitchen Lack of Transportation (Non-Medical):   Physical Activity:   . Days of Exercise per Week:   . Minutes of Exercise per Session:   Stress:   . Feeling of Stress :   Social Connections:   . Frequency of Communication with Friends and Family:   . Frequency of Social Gatherings with Friends and Family:   . Attends Religious Services:   . Active Member of Clubs or Organizations:   . Attends Archivist Meetings:   Marland Kitchen Marital Status:   Intimate Partner Violence:   . Fear of Current or Ex-Partner:   . Emotionally Abused:   Marland Kitchen Physically Abused:   . Sexually Abused:     Past Surgical History:  Procedure Laterality Date  . BIOPSY BREAST Right   . CHOLECYSTECTOMY    . HERNIA REPAIR    . LAPAROSCOPIC APPENDECTOMY N/A 08/15/2019   Procedure: APPENDECTOMY LAPAROSCOPIC;  Surgeon: Aviva Signs, MD;  Location: [facility];  Service: General;  Laterality: N/A;    Family History  Adopted: Yes  Problem Relation Age of Onset  . Other Other        adopted    No Known Allergies  Current Outpatient Medications on File Prior to Visit  Medication Sig Dispense Refill  . Cholecalciferol (VITAMIN D PO) Take 1 capsule by mouth daily.    . cyanocobalamin 500 MCG tablet Take 500 mcg by mouth daily.    Marland Kitchen FLOVENT HFA 110 MCG/ACT inhaler INHALE 1 TO 2 PUFFS BY MOUTH TWICE DAILY 12 g 0  . venlafaxine XR (EFFEXOR-XR) 37.5 MG 24 hr capsule TAKE 1 CAPSULE BY MOUTH ONCE  DAILY WITH BREAKFAST FOR  HOT  FLASHES 90 capsule 1   No current facility-administered medications on file prior to visit.    LMP 11/10/2016    Objective:   Physical Exam  Constitutional: She is oriented to person, place, and time. She appears well-nourished.  Respiratory: Effort normal. No respiratory distress.  Congested cough noted during exam  Neurological: She is alert and oriented to person, place, and time.           Assessment & Plan:

## 2020-01-05 NOTE — Patient Instructions (Signed)
Start prednisone. Take 2 tablets daily for five days.  Use the albuterol every 6 hours as needed.  You may take the cough suppressant every 8 hours as needed for cough and rest. Caution this medication contains codeine which may cause drowsiness.   Please update me in a few days as discussed.  It was a pleasure to see you today! Allie Bossier, NP-C

## 2020-01-27 ENCOUNTER — Other Ambulatory Visit: Payer: Self-pay | Admitting: Primary Care

## 2020-01-27 DIAGNOSIS — J4530 Mild persistent asthma, uncomplicated: Secondary | ICD-10-CM

## 2020-05-06 ENCOUNTER — Other Ambulatory Visit: Payer: Self-pay | Admitting: Primary Care

## 2020-05-06 DIAGNOSIS — Z1159 Encounter for screening for other viral diseases: Secondary | ICD-10-CM

## 2020-05-06 DIAGNOSIS — Z114 Encounter for screening for human immunodeficiency virus [HIV]: Secondary | ICD-10-CM

## 2020-05-06 DIAGNOSIS — R7303 Prediabetes: Secondary | ICD-10-CM

## 2020-05-11 ENCOUNTER — Other Ambulatory Visit: Payer: Self-pay

## 2020-05-11 ENCOUNTER — Ambulatory Visit (INDEPENDENT_AMBULATORY_CARE_PROVIDER_SITE_OTHER): Payer: 59 | Admitting: *Deleted

## 2020-05-11 DIAGNOSIS — Z111 Encounter for screening for respiratory tuberculosis: Secondary | ICD-10-CM | POA: Diagnosis not present

## 2020-05-14 ENCOUNTER — Telehealth: Payer: Self-pay

## 2020-05-14 LAB — TB SKIN TEST
Induration: 0 mm
TB Skin Test: NEGATIVE

## 2020-05-14 NOTE — Telephone Encounter (Signed)
Error

## 2020-05-20 ENCOUNTER — Other Ambulatory Visit: Payer: Self-pay | Admitting: Primary Care

## 2020-05-20 DIAGNOSIS — N951 Menopausal and female climacteric states: Secondary | ICD-10-CM

## 2020-06-07 ENCOUNTER — Other Ambulatory Visit: Payer: Self-pay

## 2020-06-07 ENCOUNTER — Other Ambulatory Visit (INDEPENDENT_AMBULATORY_CARE_PROVIDER_SITE_OTHER): Payer: 59

## 2020-06-07 DIAGNOSIS — R7303 Prediabetes: Secondary | ICD-10-CM | POA: Diagnosis not present

## 2020-06-07 DIAGNOSIS — Z1159 Encounter for screening for other viral diseases: Secondary | ICD-10-CM

## 2020-06-07 DIAGNOSIS — Z114 Encounter for screening for human immunodeficiency virus [HIV]: Secondary | ICD-10-CM

## 2020-06-07 LAB — COMPREHENSIVE METABOLIC PANEL
ALT: 34 U/L (ref 0–35)
AST: 26 U/L (ref 0–37)
Albumin: 4.2 g/dL (ref 3.5–5.2)
Alkaline Phosphatase: 71 U/L (ref 39–117)
BUN: 17 mg/dL (ref 6–23)
CO2: 29 mEq/L (ref 19–32)
Calcium: 9.4 mg/dL (ref 8.4–10.5)
Chloride: 102 mEq/L (ref 96–112)
Creatinine, Ser: 0.85 mg/dL (ref 0.40–1.20)
GFR: 71.02 mL/min (ref >60.00)
Glucose, Bld: 131 mg/dL — ABNORMAL HIGH (ref 70–99)
Potassium: 4.1 mEq/L (ref 3.5–5.1)
Sodium: 138 mEq/L (ref 135–145)
Total Bilirubin: 0.4 mg/dL (ref 0.2–1.2)
Total Protein: 7.1 g/dL (ref 6.0–8.3)

## 2020-06-07 LAB — LIPID PANEL
Cholesterol: 184 mg/dL (ref 0–200)
HDL: 60.7 mg/dL (ref >39.00)
LDL Cholesterol: 101 mg/dL — ABNORMAL HIGH (ref 0–99)
NonHDL: 123.72
Total CHOL/HDL Ratio: 3
Triglycerides: 112 mg/dL (ref 0.0–149.0)
VLDL: 22.4 mg/dL (ref 0.0–40.0)

## 2020-06-07 LAB — HEMOGLOBIN A1C: Hgb A1c MFr Bld: 5.8 % (ref 4.6–6.5)

## 2020-06-08 LAB — HIV ANTIBODY (ROUTINE TESTING W REFLEX): HIV 1&2 Ab, 4th Generation: NONREACTIVE

## 2020-06-08 LAB — HEPATITIS C ANTIBODY
Hepatitis C Ab: NONREACTIVE
SIGNAL TO CUT-OFF: 0.01 (ref <1.00)

## 2020-06-09 ENCOUNTER — Encounter: Payer: Self-pay | Admitting: Primary Care

## 2020-06-09 ENCOUNTER — Other Ambulatory Visit: Payer: Self-pay

## 2020-06-09 ENCOUNTER — Ambulatory Visit (INDEPENDENT_AMBULATORY_CARE_PROVIDER_SITE_OTHER): Payer: 59 | Admitting: Primary Care

## 2020-06-09 VITALS — BP 132/80 | HR 93 | Ht 67.0 in | Wt 257.0 lb

## 2020-06-09 DIAGNOSIS — M5441 Lumbago with sciatica, right side: Secondary | ICD-10-CM

## 2020-06-09 DIAGNOSIS — J4530 Mild persistent asthma, uncomplicated: Secondary | ICD-10-CM | POA: Diagnosis not present

## 2020-06-09 DIAGNOSIS — Z1211 Encounter for screening for malignant neoplasm of colon: Secondary | ICD-10-CM | POA: Diagnosis not present

## 2020-06-09 DIAGNOSIS — G8929 Other chronic pain: Secondary | ICD-10-CM

## 2020-06-09 DIAGNOSIS — N951 Menopausal and female climacteric states: Secondary | ICD-10-CM

## 2020-06-09 DIAGNOSIS — Z1231 Encounter for screening mammogram for malignant neoplasm of breast: Secondary | ICD-10-CM

## 2020-06-09 DIAGNOSIS — M5442 Lumbago with sciatica, left side: Secondary | ICD-10-CM

## 2020-06-09 DIAGNOSIS — F902 Attention-deficit hyperactivity disorder, combined type: Secondary | ICD-10-CM

## 2020-06-09 DIAGNOSIS — Z0000 Encounter for general adult medical examination without abnormal findings: Secondary | ICD-10-CM

## 2020-06-09 DIAGNOSIS — R7303 Prediabetes: Secondary | ICD-10-CM

## 2020-06-09 MED ORDER — BUPROPION HCL ER (SR) 100 MG PO TB12: 100.0000 mg | ORAL_TABLET | Freq: Two times a day (BID) | ORAL | 0 refills | Status: DC

## 2020-06-09 NOTE — Assessment & Plan Note (Signed)
Intermittent symptoms, mostly due to prolonged mask wearing. No wheezing on exam.   Compliant to Flovent, using albuterol infrequently. Continue to monitor.

## 2020-06-09 NOTE — Assessment & Plan Note (Signed)
Overall stable compared to prior labs.  Discussed the importance of a healthy diet and regular exercise in order for weight loss, and to reduce the risk of any potential medical problems.

## 2020-06-09 NOTE — Assessment & Plan Note (Signed)
Doing well on venlafaxine XR 37.5 mg. Continue same.

## 2020-06-09 NOTE — Patient Instructions (Addendum)
Call the breast center to schedule your mammogram.  You will be contacted regarding your referral to GI for the colonoscopy.  Please let us know if you have not been contacted within two weeks.   Start bupropion (Wellbutrin) SR 100 mg for focus. Take 1 tablet by mouth once daily for 3-5 days, then increase to twice daily.   Start exercising. You should be getting 150 minutes of moderate intensity exercise weekly.  It's important to improve your diet by reducing consumption of fast food, fried food, processed snack foods, sugary drinks. Increase consumption of fresh vegetables and fruits, whole grains, water.  Ensure you are drinking 64 ounces of water daily.  It was a pleasure to see you today!   Preventive Care 49-6 Years Old, Female Preventive care refers to visits with your health care provider and lifestyle choices that can promote health and wellness. This includes:  A yearly physical exam. This may also be called an annual well check.  Regular dental visits and eye exams.  Immunizations.  Screening for certain conditions.  Healthy lifestyle choices, such as eating a healthy diet, getting regular exercise, not using drugs or products that contain nicotine and tobacco, and limiting alcohol use. What can I expect for my preventive care visit? Physical exam Your health care provider will check your:  Height and weight. This may be used to calculate body mass index (BMI), which tells if you are at a healthy weight.  Heart rate and blood pressure.  Skin for abnormal spots. Counseling Your health care provider may ask you questions about your:  Alcohol, tobacco, and drug use.  Emotional well-being.  Home and relationship well-being.  Sexual activity.  Eating habits.  Work and work Statistician.  Method of birth control.  Menstrual cycle.  Pregnancy history. What immunizations do I need?  Influenza (flu) vaccine  This is recommended every year. Tetanus,  diphtheria, and pertussis (Tdap) vaccine  You may need a Td booster every 10 years. Varicella (chickenpox) vaccine  You may need this if you have not been vaccinated. Zoster (shingles) vaccine  You may need this after age 19. Measles, mumps, and rubella (MMR) vaccine  You may need at least one dose of MMR if you were born in 1957 or later. You may also need a second dose. Pneumococcal conjugate (PCV13) vaccine  You may need this if you have certain conditions and were not previously vaccinated. Pneumococcal polysaccharide (PPSV23) vaccine  You may need one or two doses if you smoke cigarettes or if you have certain conditions. Meningococcal conjugate (MenACWY) vaccine  You may need this if you have certain conditions. Hepatitis A vaccine  You may need this if you have certain conditions or if you travel or work in places where you may be exposed to hepatitis A. Hepatitis B vaccine  You may need this if you have certain conditions or if you travel or work in places where you may be exposed to hepatitis B. Haemophilus influenzae type b (Hib) vaccine  You may need this if you have certain conditions. Human papillomavirus (HPV) vaccine  If recommended by your health care provider, you may need three doses over 6 months. You may receive vaccines as individual doses or as more than one vaccine together in one shot (combination vaccines). Talk with your health care provider about the risks and benefits of combination vaccines. What tests do I need? Blood tests  Lipid and cholesterol levels. These may be checked every 5 years, or more frequently if  you are over 8 years old.  Hepatitis C test.  Hepatitis B test. Screening  Lung cancer screening. You may have this screening every year starting at age 41 if you have a 30-pack-year history of smoking and currently smoke or have quit within the past 15 years.  Colorectal cancer screening. All adults should have this screening  starting at age 16 and continuing until age 25. Your health care provider may recommend screening at age 61 if you are at increased risk. You will have tests every 1-10 years, depending on your results and the type of screening test.  Diabetes screening. This is done by checking your blood sugar (glucose) after you have not eaten for a while (fasting). You may have this done every 1-3 years.  Mammogram. This may be done every 1-2 years. Talk with your health care provider about when you should start having regular mammograms. This may depend on whether you have a family history of breast cancer.  BRCA-related cancer screening. This may be done if you have a family history of breast, ovarian, tubal, or peritoneal cancers.  Pelvic exam and Pap test. This may be done every 3 years starting at age 60. Starting at age 5, this may be done every 5 years if you have a Pap test in combination with an HPV test. Other tests  Sexually transmitted disease (STD) testing.  Bone density scan. This is done to screen for osteoporosis. You may have this scan if you are at high risk for osteoporosis. Follow these instructions at home: Eating and drinking  Eat a diet that includes fresh fruits and vegetables, whole grains, lean protein, and low-fat dairy.  Take vitamin and mineral supplements as recommended by your health care provider.  Do not drink alcohol if: ? Your health care provider tells you not to drink. ? You are pregnant, may be pregnant, or are planning to become pregnant.  If you drink alcohol: ? Limit how much you have to 0-1 drink a day. ? Be aware of how much alcohol is in your drink. In the [place] one drink equals one 12 oz bottle of beer (355 mL), one 5 oz glass of wine (148 mL), or one 1 oz glass of hard liquor (44 mL). Lifestyle  Take daily care of your teeth and gums.  Stay active. Exercise for at least 30 minutes on 5 or more days each week.  Do not use any products that contain  nicotine or tobacco, such as cigarettes, e-cigarettes, and chewing tobacco. If you need help quitting, ask your health care provider.  If you are sexually active, practice safe sex. Use a condom or other form of birth control (contraception) in order to prevent pregnancy and STIs (sexually transmitted infections).  If told by your health care provider, take low-dose aspirin daily starting at age 29. What's next?  Visit your health care provider once a year for a well check visit.  Ask your health care provider how often you should have your eyes and teeth checked.  Stay up to date on all vaccines. This information is not intended to replace advice given to you by your health care provider. Make sure you discuss any questions you have with your health care provider. Document Revised: 06/06/2018 Document Reviewed: 06/06/2018 Elsevier Patient Education  2020 [employer].

## 2020-06-09 NOTE — Assessment & Plan Note (Signed)
Immunizations up-to-date. Pap smear due next visit. Mammogram due, orders placed. Colonoscopy due, referral placed to GI.  Discussed the importance of a healthy diet and regular exercise in order for weight loss, and to reduce the risk of any potential medical problems.  Exam today unremarkable. Labs reviewed.

## 2020-06-09 NOTE — Assessment & Plan Note (Addendum)
Symptoms active, having difficulty focusing with her school work.  We will not be able to provide stimulant treatment as she has been on in the past given history of 2 negative drug screens while on prescription of Adderall.  I offered low-dose Wellbutrin SR to start, she would like to proceed.  She will update.

## 2020-06-09 NOTE — Progress Notes (Signed)
Subjective:    Patient ID: Beverly Wright, female    DOB: 1971-06-08, 49 y.o.   MRN: [id number]  HPI  This visit occurred during the SARS-CoV-2 public health emergency.  Safety protocols were in place, including screening questions prior to the visit, additional usage of staff PPE, and extensive cleaning of exam room while observing appropriate contact time as indicated for disinfecting solutions.   [name] is a 49 year old female who presents today for complete physical.  Immunizations: -Tetanus: Completed in 2016 -Influenza: Due this season  -Covid-19: Completed series   Diet: She endorses a poor diet.  Exercise: She is not exercising.   Eye exam: She will re-schedule  Dental exam: No recent exam  Pap Smear: Completed in 2018 Mammogram: No recent imaging Colonoscopy: Never completed, orders placed.  Hep C Screen: Negative  BP Readings from Last 3 Encounters:  06/09/20 132/80  08/18/19 118/73  08/16/19 112/72   Wt Readings from Last 3 Encounters:  06/09/20 257 lb (116.6 kg)  08/18/19 220 lb 0.3 oz (99.8 kg)  08/15/19 220 lb (99.8 kg)     Review of Systems  Constitutional: Negative for unexpected weight change.  HENT: Negative for rhinorrhea.   Respiratory: Negative for cough.        Intermittent SOB with prolonged use of mask  Cardiovascular: Negative for chest pain.  Gastrointestinal: Positive for constipation. Negative for diarrhea.  Genitourinary: Negative for difficulty urinating.  Musculoskeletal: Negative for arthralgias and myalgias.  Skin: Negative for rash.  Allergic/Immunologic: Negative for environmental allergies.  Neurological: Negative for dizziness, numbness and headaches.  Psychiatric/Behavioral: Positive for decreased concentration. The patient is not nervous/anxious.        Past Medical History:  Diagnosis Date  . Asthma   . Attention deficit hyperactivity disorder (ADHD) 02/05/2015   dx age 35   . Back pain      Social History     Socioeconomic History  . Marital status: Married    Spouse name: Not on file  . Number of children: Not on file  . Years of education: Not on file  . Highest education level: Not on file  Occupational History  . Not on file  Tobacco Use  . Smoking status: Former Smoker    Packs/day: 0.20    Years: 23.00    Pack years: 4.60    Types: Cigarettes    Quit date: 06/11/2017    Years since quitting: 2.9  . Smokeless tobacco: Never Used  Vaping Use  . Vaping Use: Never used  Substance and Sexual Activity  . Alcohol use: No    Alcohol/week: 0.0 standard drinks  . Drug use: No  . Sexual activity: Not on file  Other Topics Concern  . Not on file  Social History Narrative   Married.   Has custody of her grandson.   Works as a Programme researcher, broadcasting/film/video.   Also aspires to go to nursing school   Social Determinants of Health   Financial Resource Strain:   . Difficulty of Paying Living Expenses: Not on file  Food Insecurity:   . Worried About Charity fundraiser in the Last Year: Not on file  . Ran Out of Food in the Last Year: Not on file  Transportation Needs:   . Lack of Transportation (Medical): Not on file  . Lack of Transportation (Non-Medical): Not on file  Physical Activity:   . Days of Exercise per Week: Not on file  . Minutes of Exercise per Session: Not  on file  Stress:   . Feeling of Stress : Not on file  Social Connections:   . Frequency of Communication with Friends and Family: Not on file  . Frequency of Social Gatherings with Friends and Family: Not on file  . Attends Religious Services: Not on file  . Active Member of Clubs or Organizations: Not on file  . Attends Archivist Meetings: Not on file  . Marital Status: Not on file  Intimate Partner Violence:   . Fear of Current or Ex-Partner: Not on file  . Emotionally Abused: Not on file  . Physically Abused: Not on file  . Sexually Abused: Not on file    Past Surgical History:  Procedure Laterality Date  .  BIOPSY BREAST Right   . CHOLECYSTECTOMY    . HERNIA REPAIR    . LAPAROSCOPIC APPENDECTOMY N/A 08/15/2019   Procedure: APPENDECTOMY LAPAROSCOPIC;  Surgeon: Aviva Signs, MD;  Location: [facility];  Service: General;  Laterality: N/A;    Family History  Adopted: Yes  Problem Relation Age of Onset  . Other Other        adopted    No Known Allergies  Current Outpatient Medications on File Prior to Visit  Medication Sig Dispense Refill  . Ascorbic Acid (VITAMIN C PLUS WILD ROSE HIPS PO) Take by mouth.    . Multiple Vitamin (MULTIVITAMIN) tablet Take 1 tablet by mouth daily.    Marland Kitchen albuterol (PROAIR HFA) 108 (90 Base) MCG/ACT inhaler INHALE 2 PUFFS INTO THE LUNGS EVERY 4 (FOUR) HOURS AS NEEDED FOR WHEEZING OR SHORTNESS OF BREATH. 18 g 0  . albuterol (PROVENTIL) (2.5 MG/3ML) 0.083% nebulizer solution Take 3 mLs (2.5 mg total) by nebulization every 6 (six) hours as needed for wheezing or shortness of breath. 150 mL 0  . Cholecalciferol (VITAMIN D PO) Take 1 capsule by mouth daily.    . cyanocobalamin 500 MCG tablet Take 500 mcg by mouth daily.    Marland Kitchen FLOVENT HFA 110 MCG/ACT inhaler INHALE 1 TO 2 PUFFS BY MOUTH TWICE DAILY 12 g 2  . venlafaxine XR (EFFEXOR-XR) 37.5 MG 24 hr capsule TAKE 1 CAPSULE BY MOUTH ONCE DAILY WITH BREAKFAST FOR HOT FLASHES 90 capsule 0   No current facility-administered medications on file prior to visit.    BP 132/80   Pulse 93   Ht 5\' 7"  (1.702 m)   Wt 257 lb (116.6 kg)   LMP 11/10/2016   SpO2 97%   BMI 40.25 kg/m    Objective:   Physical Exam HENT:     Right Ear: Tympanic membrane and ear canal normal.     Left Ear: Tympanic membrane and ear canal normal.  Eyes:     Pupils: Pupils are equal, round, and reactive to light.  Cardiovascular:     Rate and Rhythm: Normal rate and regular rhythm.  Pulmonary:     Effort: Pulmonary effort is normal.     Breath sounds: Normal breath sounds.  Abdominal:     General: Bowel sounds are normal.     Palpations: Abdomen  is soft.     Tenderness: There is no abdominal tenderness.  Musculoskeletal:        General: Normal range of motion.     Cervical back: Neck supple.  Skin:    General: Skin is warm and dry.  Neurological:     Mental Status: She is alert and oriented to person, place, and time.     Cranial Nerves: No cranial nerve deficit.  Deep Tendon Reflexes:     Reflex Scores:      Patellar reflexes are 2+ on the right side and 2+ on the left side. Psychiatric:        Mood and Affect: Mood normal.            Assessment & Plan:

## 2020-06-09 NOTE — Assessment & Plan Note (Signed)
Significant improvement overtime.  No complaints today. Continue to monitor.

## 2020-06-16 ENCOUNTER — Encounter: Payer: Self-pay | Admitting: *Deleted

## 2020-07-07 ENCOUNTER — Telehealth (INDEPENDENT_AMBULATORY_CARE_PROVIDER_SITE_OTHER): Payer: Self-pay | Admitting: Gastroenterology

## 2020-07-07 ENCOUNTER — Other Ambulatory Visit: Payer: Self-pay

## 2020-07-07 DIAGNOSIS — Z1211 Encounter for screening for malignant neoplasm of colon: Secondary | ICD-10-CM

## 2020-07-07 DIAGNOSIS — F902 Attention-deficit hyperactivity disorder, combined type: Secondary | ICD-10-CM

## 2020-07-07 NOTE — Progress Notes (Signed)
Gastroenterology Pre-Procedure Review  Request Date: Tuesday 07/27/20 Requesting Physician: Dr. Allen Norris  PATIENT REVIEW QUESTIONS: The patient responded to the following health history questions as indicated:    1. Are you having any GI issues? yes (3 years ago patient states she started experiencing change in bowel habits.  She was told by her PCP to increase fiber which she has done.  Her stool consistency is described as "mushy consistency" occurs daily .  Other symptoms: Bloating.  No cramps experienced.  ) 2. Do you have a personal history of Polyps? no 3. Do you have a family history of Colon Cancer or Polyps? Unsure of family history she was adopted. 4. Diabetes Mellitus? no 5. Joint replacements in the past 12 months?no 6. Major health problems in the past 3 months?Appendectomy Surgery 08/15/2019 7. Any artificial heart valves, MVP, or defibrillator?no    MEDICATIONS & ALLERGIES:    Patient reports the following regarding taking any anticoagulation/antiplatelet therapy:   Plavix, Coumadin, Eliquis, Xarelto, Lovenox, Pradaxa, Brilinta, or Effient? no Aspirin? no  Patient confirms/reports the following medications:  Current Outpatient Medications  Medication Sig Dispense Refill  . albuterol (PROAIR HFA) 108 (90 Base) MCG/ACT inhaler INHALE 2 PUFFS INTO THE LUNGS EVERY 4 (FOUR) HOURS AS NEEDED FOR WHEEZING OR SHORTNESS OF BREATH. 18 g 0  . albuterol (PROVENTIL) (2.5 MG/3ML) 0.083% nebulizer solution Take 3 mLs (2.5 mg total) by nebulization every 6 (six) hours as needed for wheezing or shortness of breath. 150 mL 0  . Ascorbic Acid (VITAMIN C PLUS WILD ROSE HIPS PO) Take by mouth.    Marland Kitchen buPROPion (WELLBUTRIN SR) 100 MG 12 hr tablet Take 1 tablet (100 mg total) by mouth 2 (two) times daily. 60 tablet 0  . Calcium Carbonate Antacid (CALCIUM CARBONATE PO) Take by mouth in the morning and at bedtime.    . Cholecalciferol (VITAMIN D PO) Take 1 capsule by mouth daily.    . cyanocobalamin 500  MCG tablet Take 500 mcg by mouth daily.    Marland Kitchen FLOVENT HFA 110 MCG/ACT inhaler INHALE 1 TO 2 PUFFS BY MOUTH TWICE DAILY 12 g 2  . Multiple Vitamin (MULTIVITAMIN) tablet Take 1 tablet by mouth daily.    Marland Kitchen venlafaxine XR (EFFEXOR-XR) 37.5 MG 24 hr capsule TAKE 1 CAPSULE BY MOUTH ONCE DAILY WITH BREAKFAST FOR HOT FLASHES 90 capsule 0   No current facility-administered medications for this visit.    Patient confirms/reports the following allergies:  No Known Allergies  No orders of the defined types were placed in this encounter.   AUTHORIZATION INFORMATION Primary Insurance: 1D#: Group #:  Secondary Insurance: 1D#: Group #:  SCHEDULE INFORMATION: Date: 07/27/20 Time: Location:[facility]

## 2020-07-09 MED ORDER — BUPROPION HCL ER (SR) 150 MG PO TB12: 150.0000 mg | ORAL_TABLET | Freq: Two times a day (BID) | ORAL | 0 refills | Status: DC

## 2020-07-13 MED ORDER — BUPROPION HCL ER (SR) 150 MG PO TB12: 150.0000 mg | ORAL_TABLET | Freq: Two times a day (BID) | ORAL | 0 refills | Status: DC

## 2020-07-23 ENCOUNTER — Other Ambulatory Visit: Payer: Self-pay

## 2020-07-23 ENCOUNTER — Ambulatory Visit
Admission: RE | Admit: 2020-07-23 | Discharge: 2020-07-23 | Disposition: A | Discharge status: Home / Self Care | Payer: 59 | Source: Ambulatory Visit | Attending: Gastroenterology | Admitting: Gastroenterology

## 2020-07-23 DIAGNOSIS — Z01812 Encounter for preprocedural laboratory examination: Secondary | ICD-10-CM | POA: Diagnosis not present

## 2020-07-23 DIAGNOSIS — Z20822 Contact with and (suspected) exposure to covid-19: Secondary | ICD-10-CM | POA: Insufficient documentation

## 2020-07-23 LAB — SARS CORONAVIRUS 2 (TAT 6-24 HRS): SARS Coronavirus 2: NEGATIVE

## 2020-07-27 ENCOUNTER — Encounter
Admission: RE | Disposition: A | Discharge status: Home / Self Care | Payer: Self-pay | Source: Home / Self Care | Attending: Gastroenterology

## 2020-07-27 ENCOUNTER — Encounter: Payer: Self-pay | Admitting: Gastroenterology

## 2020-07-27 ENCOUNTER — Ambulatory Visit: Payer: 59 | Admitting: Certified Registered Nurse Anesthetist

## 2020-07-27 ENCOUNTER — Other Ambulatory Visit: Payer: Self-pay

## 2020-07-27 ENCOUNTER — Ambulatory Visit
Admission: RE | Admit: 2020-07-27 | Discharge: 2020-07-27 | Disposition: A | Discharge status: Home / Self Care | Payer: 59 | Attending: Gastroenterology | Admitting: Gastroenterology

## 2020-07-27 DIAGNOSIS — Z87891 Personal history of nicotine dependence: Secondary | ICD-10-CM | POA: Insufficient documentation

## 2020-07-27 DIAGNOSIS — D124 Benign neoplasm of descending colon: Secondary | ICD-10-CM | POA: Diagnosis not present

## 2020-07-27 DIAGNOSIS — Z1211 Encounter for screening for malignant neoplasm of colon: Secondary | ICD-10-CM | POA: Insufficient documentation

## 2020-07-27 DIAGNOSIS — K635 Polyp of colon: Secondary | ICD-10-CM

## 2020-07-27 DIAGNOSIS — J45909 Unspecified asthma, uncomplicated: Secondary | ICD-10-CM | POA: Insufficient documentation

## 2020-07-27 DIAGNOSIS — Z79899 Other long term (current) drug therapy: Secondary | ICD-10-CM | POA: Diagnosis not present

## 2020-07-27 DIAGNOSIS — Z7951 Long term (current) use of inhaled steroids: Secondary | ICD-10-CM | POA: Insufficient documentation

## 2020-07-27 DIAGNOSIS — K640 First degree hemorrhoids: Secondary | ICD-10-CM | POA: Insufficient documentation

## 2020-07-27 DIAGNOSIS — K5730 Diverticulosis of large intestine without perforation or abscess without bleeding: Secondary | ICD-10-CM | POA: Insufficient documentation

## 2020-07-27 HISTORY — PX: COLONOSCOPY WITH PROPOFOL: SHX5780

## 2020-07-27 SURGERY — COLONOSCOPY WITH PROPOFOL
Anesthesia: General

## 2020-07-27 MED ORDER — SODIUM CHLORIDE 0.9 % IV SOLN: INTRAVENOUS | Status: DC

## 2020-07-27 MED ORDER — LIDOCAINE HCL (CARDIAC) PF 100 MG/5ML IV SOSY
PREFILLED_SYRINGE | INTRAVENOUS | Status: DC | PRN
  Administered 2020-07-27: 50 mg via INTRAVENOUS

## 2020-07-27 MED ORDER — PROPOFOL 500 MG/50ML IV EMUL
INTRAVENOUS | Status: AC
  Filled 2020-07-27: qty 50

## 2020-07-27 MED ORDER — PROPOFOL 10 MG/ML IV BOLUS
INTRAVENOUS | Status: DC | PRN
  Administered 2020-07-27: 22 mg via INTRAVENOUS
  Administered 2020-07-27: 80 mg via INTRAVENOUS
  Administered 2020-07-27: 22 mg via INTRAVENOUS

## 2020-07-27 MED ORDER — PROPOFOL 500 MG/50ML IV EMUL
INTRAVENOUS | Status: DC | PRN
  Administered 2020-07-27: 135 ug/kg/min via INTRAVENOUS

## 2020-07-27 NOTE — Op Note (Signed)
[facility] Patient Name: Beverly Wright Procedure Date: 07/27/2020 9:44 AM MRN: [id number] Account #: [id number] Date of Birth: 11-19-1970 Admit Type: Outpatient Age: 49 Room: [facility] ENDO ROOM 4 Gender: Female Note Status: Finalized Procedure:             Colonoscopy Indications:           Screening for colorectal malignant neoplasm Providers:             Lucilla Lame MD, MD Referring MD:          Pleas Koch (Referring MD) Medicines:             Propofol per Anesthesia Complications:         No immediate complications. Procedure:             Pre-Anesthesia Assessment:                        - Prior to the procedure, a History and Physical was                         performed, and patient medications and allergies were                         reviewed. The patient's tolerance of previous                         anesthesia was also reviewed. The risks and benefits                         of the procedure and the sedation options and risks                         were discussed with the patient. All questions were                         answered, and informed consent was obtained. Prior                         Anticoagulants: The patient has taken no previous                         anticoagulant or antiplatelet agents. ASA Grade                         Assessment: II - A patient with mild systemic disease.                         After reviewing the risks and benefits, the patient                         was deemed in satisfactory condition to undergo the                         procedure.                        After obtaining informed consent, the colonoscope was  passed under direct vision. Throughout the procedure,                         the patient's blood pressure, pulse, and oxygen                         saturations were monitored continuously. The                         Colonoscope was introduced through  the anus and                         advanced to the the cecum, identified by appendiceal                         orifice and ileocecal valve. The colonoscopy was                         performed without difficulty. The patient tolerated                         the procedure well. The quality of the bowel                         preparation was excellent. Findings:      The perianal and digital rectal examinations were normal.      A 5 mm polyp was found in the descending colon. The polyp was sessile.       The polyp was removed with a cold snare. Resection and retrieval were       complete.      Multiple small-mouthed diverticula were found in the sigmoid colon.      Non-bleeding internal hemorrhoids were found during retroflexion. The       hemorrhoids were Grade I (internal hemorrhoids that do not prolapse). Impression:            - One 5 mm polyp in the descending colon, removed with                         a cold snare. Resected and retrieved.                        - Diverticulosis in the sigmoid colon.                        - Non-bleeding internal hemorrhoids. Recommendation:        - Discharge patient to home.                        - Resume previous diet.                        - Continue present medications.                        - Await pathology results.                        - Repeat colonoscopy in 7 years if polyp adenoma and  10 years if hyperplastic Procedure Code(s):     --- Professional ---                        [phone], Colonoscopy, flexible; with removal of                         tumor(s), polyp(s), or other lesion(s) by snare                         technique Diagnosis Code(s):     --- Professional ---                        Z12.11, Encounter for screening for malignant neoplasm                         of colon                        K63.5, Polyp of colon CPT copyright 2019 American Medical Association. All rights reserved. The codes  documented in this report are preliminary and upon coder review may  be revised to meet current compliance requirements. Lucilla Lame MD, MD 07/27/2020 10:06:32 AM This report has been signed electronically. Number of Addenda: 0 Note Initiated On: 07/27/2020 9:44 AM Scope Withdrawal Time: 0 hours 7 minutes 32 seconds  Total Procedure Duration: 0 hours 13 minutes 32 seconds  Estimated Blood Loss:  Estimated blood loss: none.      [facility]

## 2020-07-27 NOTE — Transfer of Care (Signed)
Immediate Anesthesia Transfer of Care Note  Patient: Beverly Wright  Procedure(s) Performed: COLONOSCOPY WITH PROPOFOL (N/A )  Patient Location: PACU and Endoscopy Unit  Anesthesia Type:General  Level of Consciousness: awake, alert , oriented and patient cooperative  Airway & Oxygen Therapy: Patient Spontanous Breathing  Post-op Assessment: Report given to RN and Post -op Vital signs reviewed and stable  Post vital signs: Reviewed and stable  Last Vitals:  Vitals Value Taken Time  BP 151/90 07/27/20 1009  Temp    Pulse 87 07/27/20 1009  Resp 16 07/27/20 1009  SpO2 100 % 07/27/20 1009  Vitals shown include unvalidated device data.  Last Pain:  Vitals:   07/27/20 0839  TempSrc: Temporal  PainSc: 0-No pain         Complications: No complications documented.

## 2020-07-27 NOTE — H&P (Signed)
Beverly Lame, MD [facility] [address] [place], [place] [address] Phone: [phone] Fax : [phone]  Primary Care Physician:  Pleas Koch, NP Primary Gastroenterologist:  Dr. Allen Norris  Pre-Procedure History & Physical: HPI:  Beverly Wright is a 49 y.o. female is here for a screening colonoscopy.   Past Medical History:  Diagnosis Date  . Acute appendicitis with localized peritonitis, without perforation, abscess, or gangrene   . Asthma   . Attention deficit hyperactivity disorder (ADHD) 02/05/2015   dx age 89   . Back pain     Past Surgical History:  Procedure Laterality Date  . BIOPSY BREAST Right   . CHOLECYSTECTOMY    . HERNIA REPAIR    . LAPAROSCOPIC APPENDECTOMY N/A 08/15/2019   Procedure: APPENDECTOMY LAPAROSCOPIC;  Surgeon: Aviva Signs, MD;  Location: [facility];  Service: General;  Laterality: N/A;    Prior to Admission medications   Medication Sig Start Date End Date Taking? Authorizing Provider  albuterol (PROAIR HFA) 108 (90 Base) MCG/ACT inhaler INHALE 2 PUFFS INTO THE LUNGS EVERY 4 (FOUR) HOURS AS NEEDED FOR WHEEZING OR SHORTNESS OF BREATH. 01/05/20  Yes Pleas Koch, NP  Ascorbic Acid (VITAMIN C PLUS WILD ROSE HIPS PO) Take by mouth.   Yes [provider]  buPROPion (WELLBUTRIN SR) 150 MG 12 hr tablet Take 1 tablet (150 mg total) by mouth 2 (two) times daily. 07/13/20  Yes Pleas Koch, NP  Calcium Carbonate Antacid (CALCIUM CARBONATE PO) Take by mouth in the morning and at bedtime.   Yes [provider]  Cholecalciferol (VITAMIN D PO) Take 1 capsule by mouth daily.   Yes [provider]  cyanocobalamin 500 MCG tablet Take 500 mcg by mouth daily.   Yes [provider]  FLOVENT HFA 110 MCG/ACT inhaler INHALE 1 TO 2 PUFFS BY MOUTH TWICE DAILY 01/28/20  Yes Pleas Koch, NP  Multiple Vitamin (MULTIVITAMIN) tablet Take 1 tablet by mouth daily.   Yes [provider]  venlafaxine XR (EFFEXOR-XR)  37.5 MG 24 hr capsule TAKE 1 CAPSULE BY MOUTH ONCE DAILY WITH BREAKFAST FOR HOT FLASHES 05/21/20  Yes Pleas Koch, NP  albuterol (PROVENTIL) (2.5 MG/3ML) 0.083% nebulizer solution Take 3 mLs (2.5 mg total) by nebulization every 6 (six) hours as needed for wheezing or shortness of breath. 01/05/20   Pleas Koch, NP    Allergies as of 07/07/2020  . (No Known Allergies)    Family History  Adopted: Yes  Problem Relation Age of Onset  . Other Other        adopted    Social History   Socioeconomic History  . Marital status: Married    Spouse name: Not on file  . Number of children: Not on file  . Years of education: Not on file  . Highest education level: Not on file  Occupational History  . Not on file  Tobacco Use  . Smoking status: Former Smoker    Packs/day: 0.20    Years: 23.00    Pack years: 4.60    Types: Cigarettes    Quit date: 06/11/2017    Years since quitting: 3.1  . Smokeless tobacco: Never Used  Vaping Use  . Vaping Use: Never used  Substance and Sexual Activity  . Alcohol use: No    Alcohol/week: 0.0 standard drinks  . Drug use: No  . Sexual activity: Not on file  Other Topics Concern  . Not on file  Social History Narrative  Married.   Has custody of her grandson.   Works as a Programme researcher, broadcasting/film/video.   Also aspires to go to nursing school   Social Determinants of Health   Financial Resource Strain:   . Difficulty of Paying Living Expenses: Not on file  Food Insecurity:   . Worried About Charity fundraiser in the Last Year: Not on file  . Ran Out of Food in the Last Year: Not on file  Transportation Needs:   . Lack of Transportation (Medical): Not on file  . Lack of Transportation (Non-Medical): Not on file  Physical Activity:   . Days of Exercise per Week: Not on file  . Minutes of Exercise per Session: Not on file  Stress:   . Feeling of Stress : Not on file  Social Connections:   . Frequency of Communication with Friends and Family: Not on file   . Frequency of Social Gatherings with Friends and Family: Not on file  . Attends Religious Services: Not on file  . Active Member of Clubs or Organizations: Not on file  . Attends Archivist Meetings: Not on file  . Marital Status: Not on file  Intimate Partner Violence:   . Fear of Current or Ex-Partner: Not on file  . Emotionally Abused: Not on file  . Physically Abused: Not on file  . Sexually Abused: Not on file    Review of Systems: See HPI, otherwise negative ROS  Physical Exam: BP (!) 132/108   Pulse (!) 103   Temp 97.7 F (36.5 C) (Temporal)   Resp 17   Ht 5\' 7"  (1.702 m)   Wt 108.9 kg   LMP 11/10/2016   SpO2 98%   BMI 37.59 kg/m  General:   Alert,  pleasant and cooperative in NAD Head:  Normocephalic and atraumatic. Neck:  Supple; no masses or thyromegaly. Lungs:  Clear throughout to auscultation.    Heart:  Regular rate and rhythm. Abdomen:  Soft, nontender and nondistended. Normal bowel sounds, without guarding, and without rebound.   Neurologic:  Alert and  oriented x4;  grossly normal neurologically.  Impression/Plan: Beverly Wright is now here to undergo a screening colonoscopy.  Risks, benefits, and alternatives regarding colonoscopy have been reviewed with the patient.  Questions have been answered.  All parties agreeable.

## 2020-07-27 NOTE — Anesthesia Preprocedure Evaluation (Signed)
Anesthesia Evaluation  Patient identified by MRN, date of birth, ID band Patient awake    Reviewed: Allergy & Precautions, H&P , NPO status , Patient's Chart, lab work & pertinent test results  History of Anesthesia Complications Negative for: history of anesthetic complications  Airway Mallampati: II  TM Distance: >3 FB     Dental  (+) Teeth Intact   Pulmonary asthma , neg sleep apnea, neg COPD, former smoker,    breath sounds clear to auscultation       Cardiovascular (-) angina(-) Past MI and (-) Cardiac Stents negative cardio ROS  (-) dysrhythmias  Rhythm:regular Rate:Normal     Neuro/Psych negative neurological ROS  negative psych ROS   GI/Hepatic negative GI ROS, Neg liver ROS,   Endo/Other  negative endocrine ROS  Renal/GU negative Renal ROS  negative genitourinary   Musculoskeletal   Abdominal   Peds  Hematology negative hematology ROS (+)   Anesthesia Other Findings Obese  Past Medical History: No date: Acute appendicitis with localized peritonitis, without  perforation, abscess, or gangrene No date: Asthma 02/05/2015: Attention deficit hyperactivity disorder (ADHD)     Comment:  dx age 49  No date: Back pain  Past Surgical History: No date: BIOPSY BREAST; Right No date: CHOLECYSTECTOMY No date: HERNIA REPAIR 08/15/2019: LAPAROSCOPIC APPENDECTOMY; N/A     Comment:  Procedure: APPENDECTOMY LAPAROSCOPIC;  Surgeon: Aviva Signs, MD;  Location: [facility];  Service: General;                Laterality: N/A;  BMI    Body Mass Index: 37.59 kg/m      Reproductive/Obstetrics negative OB ROS                             Anesthesia Physical Anesthesia Plan  ASA: II  Anesthesia Plan: General   Post-op Pain Management:    Induction:   PONV Risk Score and Plan: Propofol infusion and TIVA  Airway Management Planned: Nasal Cannula  Additional Equipment:    Intra-op Plan:   Post-operative Plan:   Informed Consent: I have reviewed the patients History and Physical, chart, labs and discussed the procedure including the risks, benefits and alternatives for the proposed anesthesia with the patient or authorized representative who has indicated his/her understanding and acceptance.     Dental Advisory Given  Plan Discussed with: Anesthesiologist, CRNA and Surgeon  Anesthesia Plan Comments:         Anesthesia Quick Evaluation

## 2020-07-28 ENCOUNTER — Encounter: Payer: Self-pay | Admitting: Gastroenterology

## 2020-07-28 LAB — SURGICAL PATHOLOGY

## 2020-07-28 NOTE — Anesthesia Postprocedure Evaluation (Signed)
Anesthesia Post Note  Patient: Beverly Wright  Procedure(s) Performed: COLONOSCOPY WITH PROPOFOL (N/A )  Patient location during evaluation: PACU Anesthesia Type: General Level of consciousness: awake and alert Pain management: pain level controlled Vital Signs Assessment: post-procedure vital signs reviewed and stable Respiratory status: spontaneous breathing, nonlabored ventilation and respiratory function stable Cardiovascular status: blood pressure returned to baseline and stable Postop Assessment: no apparent nausea or vomiting Anesthetic complications: no   No complications documented.   Last Vitals:  Vitals:   07/27/20 1020 07/27/20 1030  BP: 139/89 135/88  Pulse: 83 87  Resp: 17 16  Temp:    SpO2: 99% 100%    Last Pain:  Vitals:   07/28/20 0734  TempSrc:   PainSc: 0-No pain                 Tera Mater

## 2020-07-30 ENCOUNTER — Other Ambulatory Visit: Payer: Self-pay | Admitting: Primary Care

## 2020-07-30 DIAGNOSIS — J4520 Mild intermittent asthma, uncomplicated: Secondary | ICD-10-CM

## 2020-08-02 NOTE — Telephone Encounter (Signed)
See my chart message not sure If ok to refill at this time.

## 2020-08-02 NOTE — Telephone Encounter (Signed)
Refills sent to pharmacy. 

## 2020-08-02 NOTE — Telephone Encounter (Signed)
[facility] [place] [facility] - Client TELEPHONE ADVICE RECORD [facility] Patient Name: Beverly Wright Gender: Female DOB: 1971-09-11 Age: 49 Y 90 M 22 D Return Phone Number: [phone] (Secondary) Address: [place] [address] Client [place] [facility] - Client Client Site [place] - Day Physician Alma Friendly - NP Contact Type Call Who Is Calling Patient / Member / Family / Caregiver Call Type Triage / Clinical Relationship To Patient Self Return Phone Number [phone] (Secondary) Chief Complaint BREATHING - shortness of breath or sounds breathless Reason for Call Symptomatic / Request for [place] states she has a severely cough and shortness of breath. Translation No Nurse Assessment Nurse: Gildardo Pounds, RN, Amy Date/Time Eilene Ghazi Time): 08/02/2020 2:21:22 PM Confirm and document reason for call. If symptomatic, describe symptoms. ---Caller states she has a severe cough and shortness of breath. She is not SOB right now, because she did a breathing treatment. She only gets SOB when having a coughing fit. Her O2 sat will drop, & it will take about an hour for it to come back up to 95-96%. She had a colonoscopy on Tuesday. On Wednesday she got a scratchy throat. By Friday, she had congestion. She has phlegm, but can't cough it up. When she does get some up, it is thick green sputum. Her nasal drainage is green too. She wheezes when coughing. The rescue inhaler is not getting it to calm down. She has to do a nebulizer. No fever. Does the patient have any new or worsening symptoms? ---Yes Will a triage be completed? ---Yes Related visit to physician within the last 2 weeks? ---No Does the PT have any chronic conditions? (i.e. diabetes, asthma, this includes High risk factors for pregnancy, etc.) ---Yes List chronic conditions. ---asthma Is the patient pregnant or  possibly pregnant? (Ask all females between the ages of 25-55) ---No Is this a behavioral health or substance abuse call? ---No Guidelines Guideline Title Affirmed Question Affirmed Notes Nurse Date/Time Eilene Ghazi Time) Cough - Acute Non- Productive Cough [place], RN, Amy 08/02/2020 2:25:05 PM PLEASE NOTE: All timestamps contained within this report are represented as [place] Standard Time. CONFIDENTIALTY NOTICE: This fax transmission is intended only for the addressee. It contains information that is legally privileged, confidential or otherwise protected from use or disclosure. If you are not the intended recipient, you are strictly prohibited from reviewing, disclosing, copying using or disseminating any of this information or taking any action in reliance on or regarding this information. If you have received this fax in error, please notify us immediately by telephone so that we can arrange for its return to [place]. Phone: [phone], Toll-Free: [phone], Fax: [phone] Page: 2 of 2 Call Id: [id number] [place]. Time Eilene Ghazi Time) Disposition Final User 08/02/2020 2:17:53 PM Send to Urgent Queue Josephine Cables 08/02/2020 2:32:26 PM Dante, RN, Amy Caller Disagree/Comply Comply Caller Understands Yes PreDisposition InappropriateToAsk Care Advice Given Per Guideline HOME CARE: * You should be able to treat this at home. CALL BACK IF: * You become worse * Difficulty breathing occurs CARE ADVICE given per Cough - Acute Non-Productive (Adult) guideline. Comments User: Wayne Sever, RN Date/Time Eilene Ghazi Time): 08/02/2020 2:35:53 PM Caller is requesting Prednisone be called to [facility] in [place] at [phone]. 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 She had a negative COVID test a week ago from this past Friday before having her colonoscopy.  Please call back to let her know if something was called in please.

## 2020-08-03 ENCOUNTER — Encounter: Payer: Self-pay | Admitting: Gastroenterology

## 2020-08-03 ENCOUNTER — Encounter: Payer: Self-pay | Admitting: Family Medicine

## 2020-08-03 ENCOUNTER — Telehealth (INDEPENDENT_AMBULATORY_CARE_PROVIDER_SITE_OTHER): Payer: 59 | Admitting: Family Medicine

## 2020-08-03 ENCOUNTER — Other Ambulatory Visit: Payer: Self-pay | Admitting: Primary Care

## 2020-08-03 VITALS — BP 122/80 | HR 80 | Temp 97.9°F | Ht 67.0 in

## 2020-08-03 DIAGNOSIS — J069 Acute upper respiratory infection, unspecified: Secondary | ICD-10-CM | POA: Diagnosis not present

## 2020-08-03 DIAGNOSIS — J4520 Mild intermittent asthma, uncomplicated: Secondary | ICD-10-CM

## 2020-08-03 DIAGNOSIS — J4541 Moderate persistent asthma with (acute) exacerbation: Secondary | ICD-10-CM | POA: Diagnosis not present

## 2020-08-03 MED ORDER — BENZONATATE 200 MG PO CAPS: 200.0000 mg | ORAL_CAPSULE | Freq: Two times a day (BID) | ORAL | 0 refills | Status: DC | PRN

## 2020-08-03 MED ORDER — PREDNISONE 20 MG PO TABS: 20.0000 mg | ORAL_TABLET | Freq: Two times a day (BID) | ORAL | 0 refills | Status: AC

## 2020-08-03 NOTE — Progress Notes (Signed)
Established Patient Office Visit  Subjective:  Patient ID: Beverly Wright, female    DOB: 1971-04-12  Age: 49 y.o. MRN: [id number]  CC:  Chief Complaint  Patient presents with  . Cough    cough x 1 week thick green mucus with stuffy nose x 3 days. Feel like sinus is bothering her.     HPI [name] presents for evaluation of URI symptoms for 5-6  days. Ho asthma with increased rescue inhaler usage . Reports chest congestion with wheezing. Has had to use nebulizer because mdi s were not effective. Denies loss of taste or smell. Denies diarrhea. Fully vaccinated for Covid. Nursing school, last year. Wants to do labor and delivery. There has been no fever.   Past Medical History:  Diagnosis Date  . Acute appendicitis with localized peritonitis, without perforation, abscess, or gangrene   . Asthma   . Attention deficit hyperactivity disorder (ADHD) 02/05/2015   dx age 6   . Back pain     Past Surgical History:  Procedure Laterality Date  . BIOPSY BREAST Right   . CHOLECYSTECTOMY    . COLONOSCOPY WITH PROPOFOL N/A 07/27/2020   Procedure: COLONOSCOPY WITH PROPOFOL;  Surgeon: Lucilla Lame, MD;  Location: [facility];  Service: Endoscopy;  Laterality: N/A;  . HERNIA REPAIR    . LAPAROSCOPIC APPENDECTOMY N/A 08/15/2019   Procedure: APPENDECTOMY LAPAROSCOPIC;  Surgeon: Aviva Signs, MD;  Location: [facility];  Service: General;  Laterality: N/A;    Family History  Adopted: Yes  Problem Relation Age of Onset  . Other Other        adopted    Social History   Socioeconomic History  . Marital status: Married    Spouse name: Not on file  . Number of children: Not on file  . Years of education: Not on file  . Highest education level: Not on file  Occupational History  . Not on file  Tobacco Use  . Smoking status: Former Smoker    Packs/day: 0.20    Years: 23.00    Pack years: 4.60    Types: Cigarettes    Quit date: 06/11/2017    Years since quitting: 3.1  .  Smokeless tobacco: Never Used  Vaping Use  . Vaping Use: Never used  Substance and Sexual Activity  . Alcohol use: No    Alcohol/week: 0.0 standard drinks  . Drug use: No  . Sexual activity: Not on file  Other Topics Concern  . Not on file  Social History Narrative   Married.   Has custody of her grandson.   Works as a Programme researcher, broadcasting/film/video.   Also aspires to go to nursing school   Social Determinants of Health   Financial Resource Strain:   . Difficulty of Paying Living Expenses: Not on file  Food Insecurity:   . Worried About Charity fundraiser in the Last Year: Not on file  . Ran Out of Food in the Last Year: Not on file  Transportation Needs:   . Lack of Transportation (Medical): Not on file  . Lack of Transportation (Non-Medical): Not on file  Physical Activity:   . Days of Exercise per Week: Not on file  . Minutes of Exercise per Session: Not on file  Stress:   . Feeling of Stress : Not on file  Social Connections:   . Frequency of Communication with Friends and Family: Not on file  . Frequency of Social Gatherings with Friends and Family: Not on file  .  Attends Religious Services: Not on file  . Active Member of Clubs or Organizations: Not on file  . Attends Archivist Meetings: Not on file  . Marital Status: Not on file  Intimate Partner Violence:   . Fear of Current or Ex-Partner: Not on file  . Emotionally Abused: Not on file  . Physically Abused: Not on file  . Sexually Abused: Not on file    Outpatient Medications Prior to Visit  Medication Sig Dispense Refill  . albuterol (PROVENTIL) (2.5 MG/3ML) 0.083% nebulizer solution Take 3 mLs (2.5 mg total) by nebulization every 6 (six) hours as needed for wheezing or shortness of breath. 150 mL 0  . albuterol (VENTOLIN HFA) 108 (90 Base) MCG/ACT inhaler INHALE 2 PUFFS BY MOUTH EVERY 4 HOURS AS NEEDED FOR WHEEZING FOR SHORTNESS OF BREATH 18 g 0  . Ascorbic Acid (VITAMIN C PLUS WILD ROSE HIPS PO) Take by mouth.    Marland Kitchen  buPROPion (WELLBUTRIN SR) 150 MG 12 hr tablet Take 1 tablet (150 mg total) by mouth 2 (two) times daily. 60 tablet 0  . Calcium Carbonate Antacid (CALCIUM CARBONATE PO) Take by mouth in the morning and at bedtime.    . Cholecalciferol (VITAMIN D PO) Take 1 capsule by mouth daily.    . cyanocobalamin 500 MCG tablet Take 500 mcg by mouth daily.    Marland Kitchen FLOVENT HFA 110 MCG/ACT inhaler INHALE 1 TO 2 PUFFS BY MOUTH TWICE DAILY 12 g 2  . Multiple Vitamin (MULTIVITAMIN) tablet Take 1 tablet by mouth daily.    Marland Kitchen venlafaxine XR (EFFEXOR-XR) 37.5 MG 24 hr capsule TAKE 1 CAPSULE BY MOUTH ONCE DAILY WITH BREAKFAST FOR HOT FLASHES 90 capsule 0   No facility-administered medications prior to visit.    No Known Allergies  ROS Review of Systems  Constitutional: Negative for chills, diaphoresis, fever and unexpected weight change.  HENT: Positive for congestion and postnasal drip.   Eyes: Negative for photophobia and visual disturbance.  Respiratory: Positive for cough and wheezing. Negative for chest tightness and shortness of breath.   Cardiovascular: Negative.   Gastrointestinal: Positive for vomiting. Negative for diarrhea and nausea.  Endocrine: Negative for polyphagia and polyuria.  Genitourinary: Negative.   Musculoskeletal: Positive for myalgias. Negative for arthralgias.  Allergic/Immunologic: Negative for immunocompromised state.  Hematological: Does not bruise/bleed easily.  Psychiatric/Behavioral: Negative.       Objective:    Physical Exam Vitals and nursing note reviewed.  Constitutional:      General: She is not in acute distress.    Appearance: Normal appearance. She is not ill-appearing, toxic-appearing or diaphoretic.  HENT:     Head: Normocephalic and atraumatic.     Right Ear: External ear normal.     Left Ear: External ear normal.     Mouth/Throat:     Pharynx: Oropharynx is clear. No oropharyngeal exudate or posterior oropharyngeal erythema.  Eyes:     General:         Right eye: No discharge.        Left eye: No discharge.     Conjunctiva/sclera: Conjunctivae normal.  Pulmonary:     Effort: Pulmonary effort is normal.  Neurological:     Mental Status: She is alert and oriented to person, place, and time.  Psychiatric:        Mood and Affect: Mood normal.        Behavior: Behavior normal.     BP 122/80   Pulse 80   Temp 97.9 F (36.6  C) (Tympanic)   Ht 5\' 7"  (1.702 m)   LMP 11/10/2016   SpO2 98%   BMI 37.59 kg/m  Wt Readings from Last 3 Encounters:  07/27/20 240 lb (108.9 kg)  06/09/20 257 lb (116.6 kg)  08/18/19 220 lb 0.3 oz (99.8 kg)     Health Maintenance Due  Topic Date Due  . PAP SMEAR-Modifier  04/23/2020  . INFLUENZA VACCINE  05/09/2020    There are no preventive care reminders to display for this patient.  Lab Results  Component Value Date   TSH 3.49 01/30/2019   Lab Results  Component Value Date   WBC 10.0 08/18/2019   HGB 12.1 08/18/2019   HCT 37.2 08/18/2019   MCV 93.9 08/18/2019   PLT 299 08/18/2019   Lab Results  Component Value Date   NA 138 06/07/2020   K 4.1 06/07/2020   CO2 29 06/07/2020   GLUCOSE 131 (H) 06/07/2020   BUN 17 06/07/2020   CREATININE 0.85 06/07/2020   BILITOT 0.4 06/07/2020   ALKPHOS 71 06/07/2020   AST 26 06/07/2020   ALT 34 06/07/2020   PROT 7.1 06/07/2020   ALBUMIN 4.2 06/07/2020   CALCIUM 9.4 06/07/2020   ANIONGAP 9 08/18/2019   GFR 71.02 06/07/2020   Lab Results  Component Value Date   CHOL 184 06/07/2020   Lab Results  Component Value Date   HDL 60.70 06/07/2020   Lab Results  Component Value Date   LDLCALC 101 (H) 06/07/2020   Lab Results  Component Value Date   TRIG 112.0 06/07/2020   Lab Results  Component Value Date   CHOLHDL 3 06/07/2020   Lab Results  Component Value Date   HGBA1C 5.8 06/07/2020      Assessment & Plan:   Problem List Items Addressed This Visit    None    Visit Diagnoses    Viral upper respiratory tract infection    -   Primary   Relevant Medications   benzonatate (TESSALON) 200 MG capsule   Moderate persistent reactive airway disease with acute exacerbation       Relevant Medications   predniSONE (DELTASONE) 20 MG tablet   benzonatate (TESSALON) 200 MG capsule      Meds ordered this encounter  Medications  . predniSONE (DELTASONE) 20 MG tablet    Sig: Take 1 tablet (20 mg total) by mouth 2 (two) times daily with a meal for 7 days.    Dispense:  14 tablet    Refill:  0  . benzonatate (TESSALON) 200 MG capsule    Sig: Take 1 capsule (200 mg total) by mouth 2 (two) times daily as needed for cough.    Dispense:  20 capsule    Refill:  0    Follow-up: Return call Friday if not improving with prednisone.Libby Maw, MD   Virtual Visit via Video Note  I connected with Leanord Asal on 08/03/20 at  3:00 PM EDT by a video enabled telemedicine application and verified that I am speaking with the correct person using two identifiers.  Location: Patient: Home alone.   Provider:    I discussed the limitations of evaluation and management by telemedicine and the availability of in person appointments. The patient expressed understanding and agreed to proceed.  History of Present Illness:    Observations/Objective:   Assessment and Plan:   Follow Up Instructions:    I discussed the assessment and treatment plan with the patient. The patient was provided  an opportunity to ask questions and all were answered. The patient agreed with the plan and demonstrated an understanding of the instructions.   The patient was advised to call back or seek an in-person evaluation if the symptoms worsen or if the condition fails to improve as anticipated.  I provided 2o minutes of non-face-to-face time during this encounter.   Libby Maw, MD

## 2020-08-03 NOTE — Telephone Encounter (Signed)
Pt called back wanting a call  Best number [phone]

## 2020-08-19 ENCOUNTER — Other Ambulatory Visit: Payer: Self-pay | Admitting: Primary Care

## 2020-08-19 ENCOUNTER — Ambulatory Visit: Payer: 59

## 2020-08-19 DIAGNOSIS — F902 Attention-deficit hyperactivity disorder, combined type: Secondary | ICD-10-CM

## 2020-08-23 ENCOUNTER — Other Ambulatory Visit: Payer: Self-pay | Admitting: Primary Care

## 2020-08-23 DIAGNOSIS — N951 Menopausal and female climacteric states: Secondary | ICD-10-CM

## 2020-08-26 ENCOUNTER — Telehealth: Payer: Self-pay

## 2020-08-26 NOTE — Telephone Encounter (Signed)
Pt last video visit on 08/03/20; pt said breathing treatment helps the SOB but did not help the cough. Pt thinks has irritation due to sinus drainage. Pt said she does not have time to go to [facility] because pt is at end of her semester and she also has to pick up a child from private school shortly. No fever; no CP; pulse ox 98% and P 108. Pt said when taking prednisone that helped. Pt said she was outside and someone was burning leaves and that started her symptoms again. Pt request virtual visit; no visit available 08/26/20 or 08/27/20 and pt request note sent to Gentry Fitz NP. [facility] [place].

## 2020-08-26 NOTE — Telephone Encounter (Signed)
[facility] - Client TELEPHONE ADVICE RECORD [facility] Patient Name: Beverly Wright Gender: Female DOB: 19-Apr-1971 Age: 49 Y 13 M 15 D Return Phone Number: [phone] (Primary) Address: City/State/Zip: [place] [address] Client [facility] - Client Client Site [place] - Day Physician Alma Friendly - NP Contact Type Call Who Is Calling Patient / Member / Family / Caregiver Call Type Triage / Clinical Relationship To Patient Self Return Phone Number [phone] (Primary) Chief Complaint CHEST PAIN (>=21 years) - pain, pressure, heaviness or tightness Reason for Call Symptomatic / Request for [place] states she has been experiencing a head cold and chest heaviness. Caller states she was seen, but the symptoms have not subsided. Translation No Nurse Assessment Nurse: Raphael Gibney, RN, Vanita Ingles Date/Time (Eastern Time): 08/26/2020 10:39:29 AM Confirm and document reason for call. If symptomatic, describe symptoms. ---Caller states she has sinus congestion with sinus headache. She is coughing. She did a breathing treatment this am Post nasal drip is causing the cough. Temp 97.9. had virtual appt recently and she has completed prednisone. albuterol nebulizer helped the SOB. Oxygen level is 99. Does the patient have any new or worsening symptoms? ---Yes Will a triage be completed? ---Yes Related visit to physician within the last 2 weeks? ---Yes Does the PT have any chronic conditions? (i.e. diabetes, asthma, this includes High risk factors for pregnancy, etc.) ---Yes List chronic conditions. ---allergies; asthma Is the patient pregnant or possibly pregnant? (Ask all females between the ages of 18-55) ---No Is this a behavioral health or substance abuse call? ---No Guidelines Guideline Title Affirmed Question Affirmed Notes Nurse Date/Time Eilene Ghazi Time) Sinus  Pain or Congestion [1] SEVERE pain AND [2] not improved 2 hours after pain medicine Raphael Gibney, RN, Vera 08/26/2020 10:44:06 AM Disp. Time Eilene Ghazi Time) Disposition Final User PLEASE NOTE: All timestamps contained within this report are represented as [place] Standard Time. CONFIDENTIALTY NOTICE: This fax transmission is intended only for the addressee. It contains information that is legally privileged, confidential or otherwise protected from use or disclosure. If you are not the intended recipient, you are strictly prohibited from reviewing, disclosing, copying using or disseminating any of this information or taking any action in reliance on or regarding this information. If you have received this fax in error, please notify us immediately by telephone so that we can arrange for its return to [place]. Phone: [phone], Toll-Free: [phone], Fax: [phone] Page: 2 of 2 Call Id: [id number] 08/26/2020 10:50:39 AM See HCP within 4 Hours (or PCP triage) Yes Raphael Gibney, RN, Doreatha Lew Disagree/Comply Comply Caller Understands Yes PreDisposition Call Doctor Care Advice Given Per Guideline SEE HCP (OR PCP TRIAGE) WITHIN 4 HOURS: * IF OFFICE WILL BE OPEN: You need to be seen within the next 3 or 4 hours. Call your doctor (or NP/PA) now or as soon as the office opens. CALL BACK IF: * You become worse CARE ADVICE given per Sinus Pain or Congestion (Adult) guideline. Comments User: Dannielle Burn, RN Date/Time Eilene Ghazi Time): 08/26/2020 10:50:18 AM called back line and spoke to [place]. No appts available for today. Advised pt to go to urgent care. Pt is not sure if she will go or not Referrals GO TO FACILITY UNDECIDED

## 2020-08-26 NOTE — Telephone Encounter (Signed)
Okay to add patient on virtually for 11/19 at 12:00 pm.

## 2020-08-27 ENCOUNTER — Encounter: Payer: Self-pay | Admitting: Primary Care

## 2020-08-27 ENCOUNTER — Telehealth (INDEPENDENT_AMBULATORY_CARE_PROVIDER_SITE_OTHER): Payer: 59 | Admitting: Primary Care

## 2020-08-27 VITALS — HR 116 | Temp 97.7°F | Ht 67.0 in | Wt 240.0 lb

## 2020-08-27 DIAGNOSIS — J019 Acute sinusitis, unspecified: Secondary | ICD-10-CM | POA: Diagnosis not present

## 2020-08-27 HISTORY — DX: Acute sinusitis, unspecified: J01.90

## 2020-08-27 MED ORDER — AMOXICILLIN-POT CLAVULANATE 875-125 MG PO TABS: 1.0000 | ORAL_TABLET | Freq: Two times a day (BID) | ORAL | 0 refills | Status: DC

## 2020-08-27 NOTE — Telephone Encounter (Signed)
I called and lef a voicemail.

## 2020-08-27 NOTE — Patient Instructions (Signed)
Start Augmentin antibiotics for the infection Take 1 tablet by mouth twice daily for 10 days.  Please update me within the next 3 to 4 days if no improvement.  It was a pleasure to see you today! Allie Bossier, NP-C

## 2020-08-27 NOTE — Progress Notes (Signed)
Subjective:    Patient ID: Beverly Wright, female    DOB: Aug 03, 1971, 49 y.o.   MRN: [id number]  HPI  Virtual Visit via Video Note  I connected with Beverly Wright on 08/27/20 at 12:00 PM EST by a video enabled telemedicine application and verified that I am speaking with the correct person using two identifiers.  Location: Patient: Home Provider: Office Participants: Patient and myself   I discussed the limitations of evaluation and management by telemedicine and the availability of in person appointments. The patient expressed understanding and agreed to proceed.  History of Present Illness:  Beverly Wright is a 49 year old female with a history of asthma, prediabetes who presents today with a chief complaint of sinus pressure.  Symptoms originally began around October 21 with cough, nasal congestion.  She was evaluated by Dr. Ethelene Hal virtually on October 26 for the symptoms.  She was diagnosed with viral URI and prescribed prednisone and Tessalon Perles.  Since that visit her symptoms improved slightly, but then began progressing again over the last week.  Her most bothersome symptom is bilateral maxillary and frontal sinus pressure/pain and persistent deep dry cough.  She has been using all of her over-the-counter products without improvement.   Observations/Objective:  Alert and oriented. Appears tired. No distress. Speaking in complete sentences. Dry cough several times during exam.  Assessment and Plan:  Acute upper respiratory symptoms x3 to 4 weeks, temporary improvement with prednisone, now progressing. Given duration of symptoms coupled with examination today, will treat for sinusitis.  Augmentin course sent to pharmacy.  She will update if no improvement within the next 3 to 4 days.  Follow Up Instructions:  Start Augmentin antibiotics for the infection Take 1 tablet by mouth twice daily for 10 days.  Please update me within the next 3 to 4 days if no  improvement.  It was a pleasure to see you today! Allie Bossier, NP-C    I discussed the assessment and treatment plan with the patient. The patient was provided an opportunity to ask questions and all were answered. The patient agreed with the plan and demonstrated an understanding of the instructions.   The patient was advised to call back or seek an in-person evaluation if the symptoms worsen or if the condition fails to improve as anticipated.    Pleas Koch, NP    Review of Systems  Constitutional: Positive for fatigue.  HENT: Positive for congestion, sinus pressure and sinus pain.   Respiratory: Positive for cough and shortness of breath.   Allergic/Immunologic: Positive for environmental allergies.       Past Medical History:  Diagnosis Date  . Acute appendicitis with localized peritonitis, without perforation, abscess, or gangrene   . Asthma   . Attention deficit hyperactivity disorder (ADHD) 02/05/2015   dx age 76   . Back pain      Social History   Socioeconomic History  . Marital status: Married    Spouse name: Not on file  . Number of children: Not on file  . Years of education: Not on file  . Highest education level: Not on file  Occupational History  . Not on file  Tobacco Use  . Smoking status: Former Smoker    Packs/day: 0.20    Years: 23.00    Pack years: 4.60    Types: Cigarettes    Quit date: 06/11/2017    Years since quitting: 3.2  . Smokeless tobacco: Never Used  Vaping Use  . Vaping  Use: Never used  Substance and Sexual Activity  . Alcohol use: No    Alcohol/week: 0.0 standard drinks  . Drug use: No  . Sexual activity: Not on file  Other Topics Concern  . Not on file  Social History Narrative   Married.   Has custody of her grandson.   Works as a Programme researcher, broadcasting/film/video.   Also aspires to go to nursing school   Social Determinants of Health   Financial Resource Strain:   . Difficulty of Paying Living Expenses: Not on file  Food Insecurity:     . Worried About Charity fundraiser in the Last Year: Not on file  . Ran Out of Food in the Last Year: Not on file  Transportation Needs:   . Lack of Transportation (Medical): Not on file  . Lack of Transportation (Non-Medical): Not on file  Physical Activity:   . Days of Exercise per Week: Not on file  . Minutes of Exercise per Session: Not on file  Stress:   . Feeling of Stress : Not on file  Social Connections:   . Frequency of Communication with Friends and Family: Not on file  . Frequency of Social Gatherings with Friends and Family: Not on file  . Attends Religious Services: Not on file  . Active Member of Clubs or Organizations: Not on file  . Attends Archivist Meetings: Not on file  . Marital Status: Not on file  Intimate Partner Violence:   . Fear of Current or Ex-Partner: Not on file  . Emotionally Abused: Not on file  . Physically Abused: Not on file  . Sexually Abused: Not on file    Past Surgical History:  Procedure Laterality Date  . BIOPSY BREAST Right   . CHOLECYSTECTOMY    . COLONOSCOPY WITH PROPOFOL N/A 07/27/2020   Procedure: COLONOSCOPY WITH PROPOFOL;  Surgeon: Lucilla Lame, MD;  Location: [facility] ENDOSCOPY;  Service: Endoscopy;  Laterality: N/A;  . HERNIA REPAIR    . LAPAROSCOPIC APPENDECTOMY N/A 08/15/2019   Procedure: APPENDECTOMY LAPAROSCOPIC;  Surgeon: Aviva Signs, MD;  Location: [facility] ORS;  Service: General;  Laterality: N/A;    Family History  Adopted: Yes  Problem Relation Age of Onset  . Other Other        adopted    No Known Allergies  Current Outpatient Medications on File Prior to Visit  Medication Sig Dispense Refill  . albuterol (PROVENTIL) (2.5 MG/3ML) 0.083% nebulizer solution Take 3 mLs (2.5 mg total) by nebulization every 6 (six) hours as needed for wheezing or shortness of breath. 150 mL 0  . albuterol (VENTOLIN HFA) 108 (90 Base) MCG/ACT inhaler INHALE 2 PUFFS BY MOUTH EVERY 4 HOURS AS NEEDED FOR WHEEZING FOR SHORTNESS OF  BREATH 18 g 0  . Ascorbic Acid (VITAMIN C PLUS WILD ROSE HIPS PO) Take by mouth.    Marland Kitchen buPROPion (WELLBUTRIN SR) 150 MG 12 hr tablet Take 1 tablet by mouth twice daily 60 tablet 0  . Calcium Carbonate Antacid (CALCIUM CARBONATE PO) Take by mouth in the morning and at bedtime.    . Cholecalciferol (VITAMIN D PO) Take 1 capsule by mouth daily.    Marland Kitchen FLOVENT HFA 110 MCG/ACT inhaler INHALE 1 TO 2 PUFFS BY MOUTH TWICE DAILY 12 g 2  . Multiple Vitamin (MULTIVITAMIN) tablet Take 1 tablet by mouth daily.    Marland Kitchen venlafaxine XR (EFFEXOR-XR) 37.5 MG 24 hr capsule TAKE 1 CAPSULE BY MOUTH ONCE DAILY WITH BREAKFAST FOR HOT FLASHES 90 capsule  0  . cyanocobalamin 500 MCG tablet Take 500 mcg by mouth daily. (Patient not taking: Reported on 08/27/2020)     No current facility-administered medications on file prior to visit.    Pulse (!) 116   Temp 97.7 F (36.5 C) (Temporal)   Ht 5\' 7"  (1.702 m)   Wt 240 lb (108.9 kg)   LMP 11/10/2016   SpO2 97%   BMI 37.59 kg/m    Objective:   Physical Exam Constitutional:      General: She is not in acute distress. Pulmonary:     Effort: Pulmonary effort is normal.     Comments: Dry cough noted several times during visit. Neurological:     Mental Status: She is alert and oriented to person, place, and time.            Assessment & Plan:

## 2020-08-27 NOTE — Assessment & Plan Note (Signed)
Acute upper respiratory symptoms x3 to 4 weeks, temporary improvement with prednisone, now progressing. Given duration of symptoms coupled with examination today, will treat for sinusitis.  Augmentin course sent to pharmacy.  She will update if no improvement within the next 3 to 4 days.

## 2020-08-27 NOTE — Telephone Encounter (Signed)
Pt would like to have the 12pm mychart video visit, given to [place] to schedule

## 2020-08-30 ENCOUNTER — Telehealth: Payer: Self-pay | Admitting: Primary Care

## 2020-08-30 DIAGNOSIS — R053 Chronic cough: Secondary | ICD-10-CM

## 2020-08-30 NOTE — Telephone Encounter (Signed)
Please advise 

## 2020-08-30 NOTE — Telephone Encounter (Signed)
Pt called in wanted to let Anda Kraft know that she is still not feeling better and wanted to know what are the next steps and if she can call her.

## 2020-08-30 NOTE — Telephone Encounter (Signed)
Please find out symptoms and more details. Are her symptoms more asthma related, shortness of breath/wheezing/cough? Any fevers?  Has she been taking the Augmentin antibiotics? Any additional information would be helpful.

## 2020-08-31 ENCOUNTER — Other Ambulatory Visit: Payer: Self-pay

## 2020-08-31 ENCOUNTER — Other Ambulatory Visit: Payer: 59

## 2020-08-31 DIAGNOSIS — Z20822 Contact with and (suspected) exposure to covid-19: Secondary | ICD-10-CM

## 2020-08-31 NOTE — Telephone Encounter (Signed)
Noted, x-ray ordered and pending.

## 2020-08-31 NOTE — Telephone Encounter (Signed)
She is in [place] now to pick up daughter can not get to either by the end of day today. She will go online now and make app at [facility] for test tomorrow and go to [facility] for xray in the morning. If for some reason she can not get covid test done at [facility] pen tomorrow she will call and let me know I will have her set up for testing at our office.

## 2020-08-31 NOTE — Telephone Encounter (Signed)
Can we get her in for a chest xray today? I believe she recently tested negative for Covid-19. If Tamra doesn't feel comfortable, then we can send her to the [facility] outpatient imaging center.

## 2020-08-31 NOTE — Telephone Encounter (Signed)
Added to open phone note

## 2020-08-31 NOTE — Telephone Encounter (Signed)
Added from my chart message:   Good morning Beverly Wright,  I called the office yesterday to get a message to you that I still wasnt feeling better, but Im not sure if you received it or not. As of this morning I still have not improved. Im still very congested in my chest with a cough that has minimal production. I get very fatigued with minimal movement and continue to have periods of profuse sweating (unlike hot flashes). Please advise on our next course of action.    Thanks, Beverly Wright  I have also called patient she having sob and congestion in chest but does not feel like her normal asthma. She has not had any fever and facial and sinus pain has improved. More in chest. She is still taking abx but not anything other than her med list.

## 2020-09-01 ENCOUNTER — Ambulatory Visit
Admission: RE | Admit: 2020-09-01 | Discharge: 2020-09-01 | Disposition: A | Discharge status: Home / Self Care | Payer: 59 | Attending: Primary Care | Admitting: Primary Care

## 2020-09-01 ENCOUNTER — Ambulatory Visit
Admission: RE | Admit: 2020-09-01 | Discharge: 2020-09-01 | Disposition: A | Discharge status: Home / Self Care | Payer: 59 | Source: Ambulatory Visit | Attending: Primary Care | Admitting: Primary Care

## 2020-09-01 ENCOUNTER — Other Ambulatory Visit: Payer: Self-pay

## 2020-09-01 DIAGNOSIS — R053 Chronic cough: Secondary | ICD-10-CM | POA: Insufficient documentation

## 2020-09-02 LAB — SARS-COV-2, NAA 2 DAY TAT

## 2020-09-02 LAB — NOVEL CORONAVIRUS, NAA: SARS-CoV-2, NAA: NOT DETECTED

## 2020-09-23 ENCOUNTER — Other Ambulatory Visit: Payer: 59

## 2020-09-23 ENCOUNTER — Other Ambulatory Visit: Payer: Self-pay

## 2020-09-23 DIAGNOSIS — Z20822 Contact with and (suspected) exposure to covid-19: Secondary | ICD-10-CM

## 2020-09-25 LAB — NOVEL CORONAVIRUS, NAA: SARS-CoV-2, NAA: NOT DETECTED

## 2020-09-25 LAB — SPECIMEN STATUS REPORT

## 2020-09-25 LAB — SARS-COV-2, NAA 2 DAY TAT

## 2020-10-21 ENCOUNTER — Other Ambulatory Visit: Payer: Self-pay | Admitting: Primary Care

## 2020-10-21 DIAGNOSIS — J4530 Mild persistent asthma, uncomplicated: Secondary | ICD-10-CM

## 2020-10-21 DIAGNOSIS — J4520 Mild intermittent asthma, uncomplicated: Secondary | ICD-10-CM

## 2020-10-29 ENCOUNTER — Other Ambulatory Visit: Payer: 59

## 2020-10-29 ENCOUNTER — Other Ambulatory Visit: Payer: Self-pay

## 2020-10-29 DIAGNOSIS — Z20822 Contact with and (suspected) exposure to covid-19: Secondary | ICD-10-CM

## 2020-10-30 LAB — SARS-COV-2, NAA 2 DAY TAT

## 2020-10-30 LAB — NOVEL CORONAVIRUS, NAA: SARS-CoV-2, NAA: NOT DETECTED

## 2020-11-02 ENCOUNTER — Other Ambulatory Visit: Payer: 59

## 2020-11-09 IMAGING — CT CT ABD-PELV W/ CM
2 of 5 series · 15 of 46 positions shown, 17 images · IV contrast (Omnipaque or Isovue)
Comparison: CT abdomen 01/11/2018, CT abdomen pelvis 08/10/2017

CLINICAL DATA: Abdominal pain, appendicitis suspected

EXAM:
CT ABDOMEN AND PELVIS WITH CONTRAST
TECHNIQUE: Multidetector CT imaging of the abdomen and pelvis was performed
using the standard protocol following bolus administration of
intravenous contrast.
CONTRAST:  100mL OMNIPAQUE IOHEXOL 300 MG/ML  SOLN

[Series 2: axial st · axial · 0.98mm/px · z∈[+568,+958]mm · 12 of 94 slices shown, 14 images]
[im 8/94  soft-tissue]
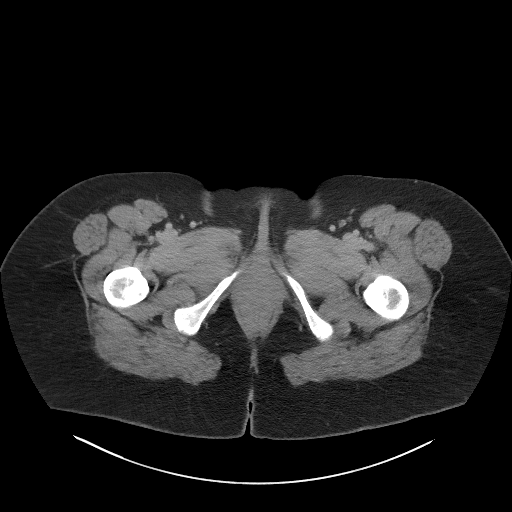
[im 8/94  bone]
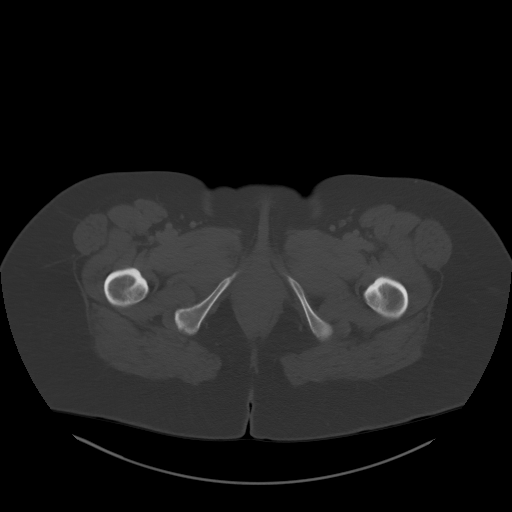
[im 15/94  soft-tissue]
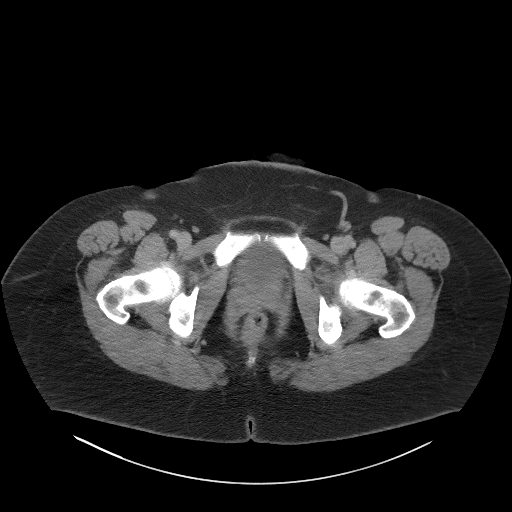
[im 22/94  soft-tissue]
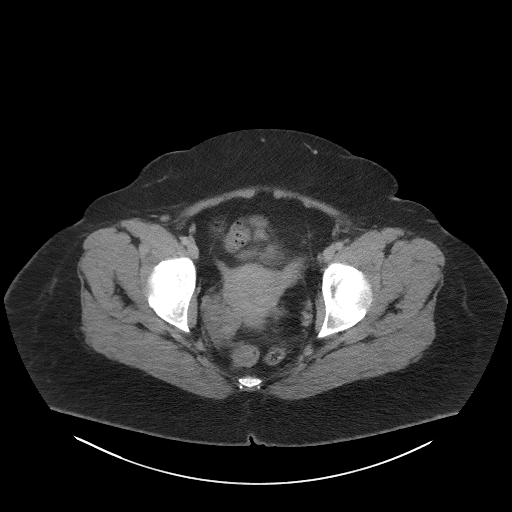
[im 29/94  soft-tissue]
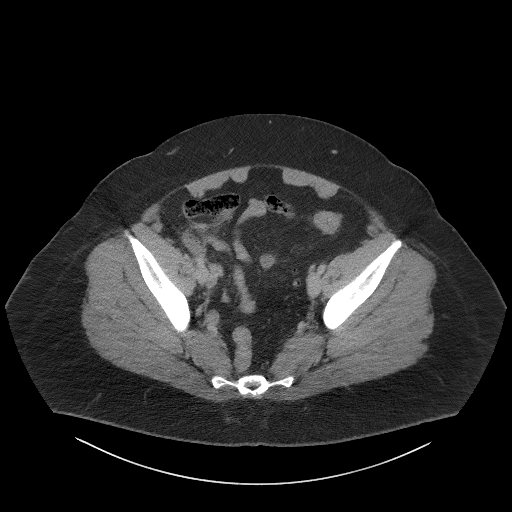
[im 36/94  soft-tissue]
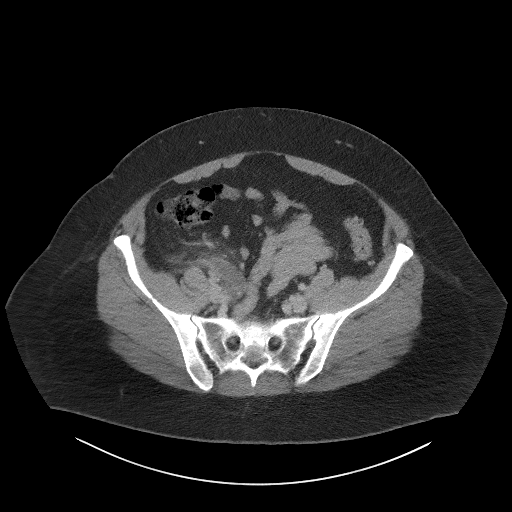
[im 43/94  soft-tissue]
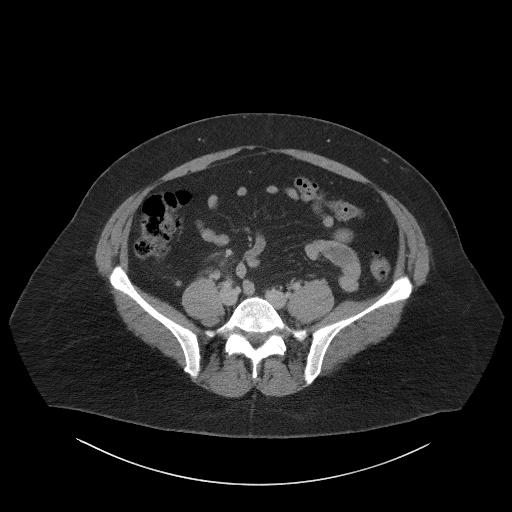
[im 51/94  soft-tissue]
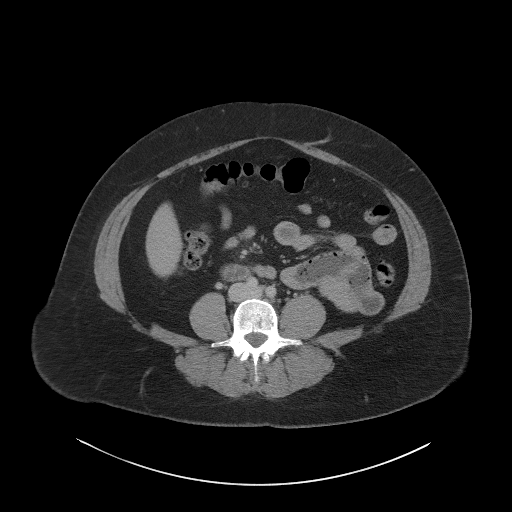
[im 58/94  soft-tissue]
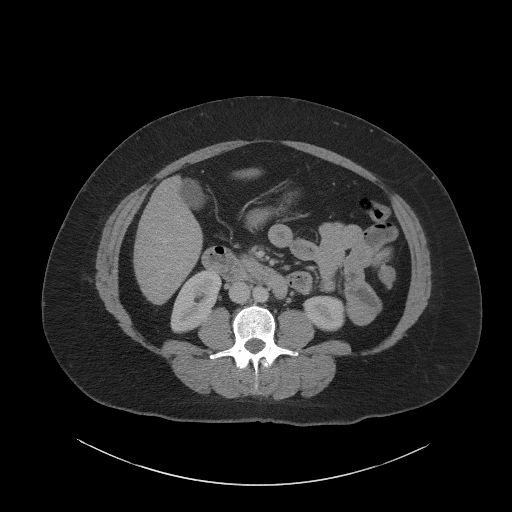
[im 65/94  soft-tissue]
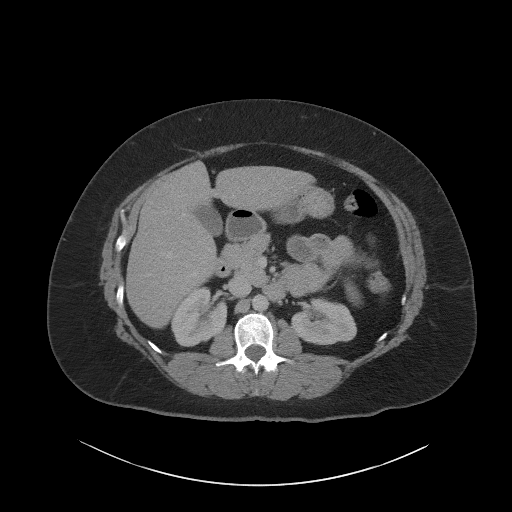
[im 65/94  bone]
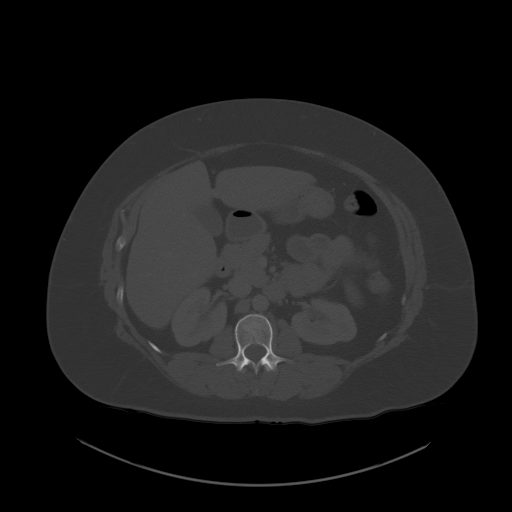
[im 72/94  soft-tissue]
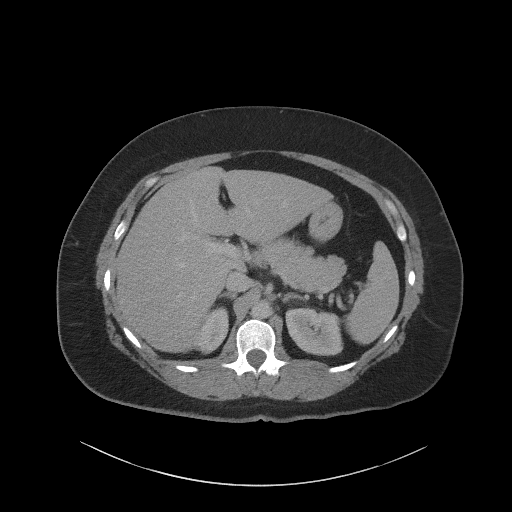
[im 79/94  soft-tissue]
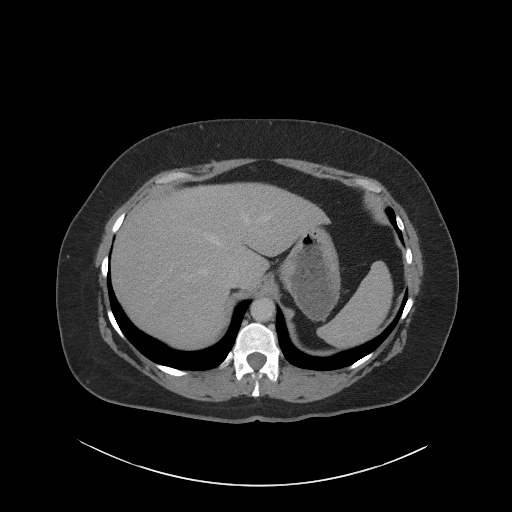
[im 86/94  soft-tissue]
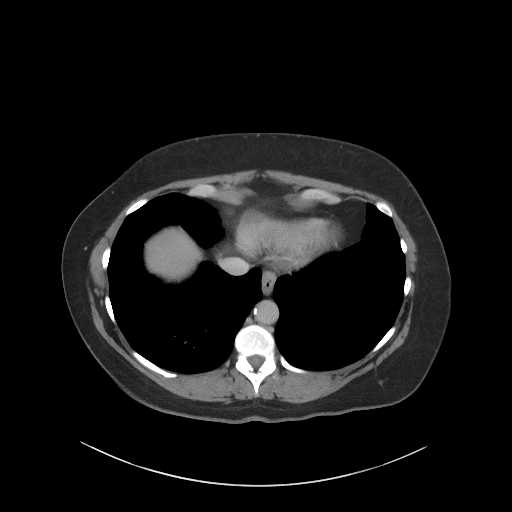

[Series 5: coronal st · coronal · 0.87mm/px · 3 of 117 slices shown]
[im 39/117  soft-tissue]
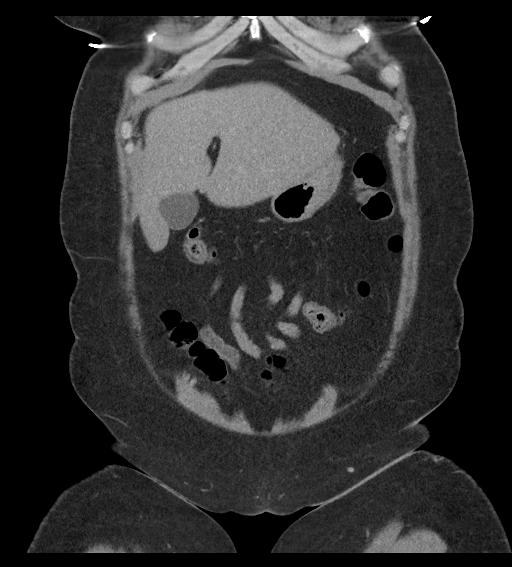
[im 52/117  soft-tissue]
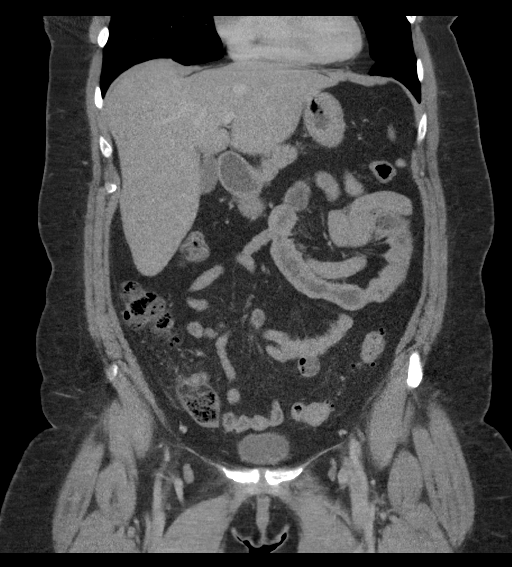
[im 65/117  soft-tissue]
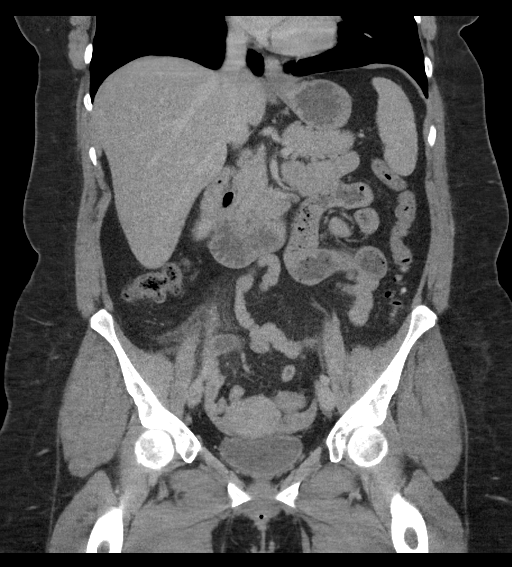

[15 of 46 positions shown; findings below may reference images not displayed]

FINDINGS: Lower chest: Bandlike opacities in the bases, likely atelectasis or
scarring. Normal heart size. No pericardial effusion.

Hepatobiliary: Stable calcifications adjacent the gallbladder fossa.
No concerning focal liver abnormality is seen. No gallstones,
gallbladder wall thickening, or biliary dilatation.

Pancreas: Unremarkable. No pancreatic ductal dilatation or
surrounding inflammatory changes.

Spleen: Normal in size without focal abnormality. Small accessory
splenules

Adrenals/Urinary Tract: Adrenal glands are unremarkable. Kidneys are
normal, without renal calculi, focal lesion, or hydronephrosis.
Bladder is unremarkable. Symmetric excretion without extravasation
of contrast on excretory phase delayed imaging.

Stomach/Bowel: Distal esophagus, stomach and duodenal sweep are
unremarkable. No small bowel wall thickening or dilatation. No
evidence of obstruction. Dilated (17 mm), fluid-filled, and
hyperemic appendix with several fecaliths present within the
appendiceal lumen. Significant adjacent periappendiceal phlegmonous
change in few reactive lymph nodes. No extraluminal gas or organized
collection. Reactive appearing free fluid tracks to the deep pelvis.
No colonic dilatation or wall thickening. Scattered colonic
diverticula without focal pericolonic inflammation to suggest
diverticulitis.

Vascular/Lymphatic: Atherosclerotic plaque within the normal caliber
aorta. No suspicious or enlarged lymph nodes in the included
lymphatic chains.

Reproductive: Normal appearance of the uterus and adnexal
structures.

Other: Right lower quadrant phlegmon and trace reactive free fluid,
as above. No free air in the abdomen or pelvis. No bowel containing
hernia.

Musculoskeletal: No acute osseous abnormality or suspicious osseous
lesion.
IMPRESSION: Acute appendicitis without evidence of perforation or abscess
formation.

Diverticulosis without evidence of acute diverticulitis.

Stable hepatic calcifications adjacent the gallbladder fossa, likely
benign etiology such as calcified granulomata.

Aortic Atherosclerosis ([id number].R).

## 2020-11-28 ENCOUNTER — Other Ambulatory Visit: Payer: Self-pay | Admitting: Primary Care

## 2020-11-28 DIAGNOSIS — N951 Menopausal and female climacteric states: Secondary | ICD-10-CM

## 2021-02-24 ENCOUNTER — Other Ambulatory Visit: Payer: Self-pay | Admitting: Primary Care

## 2021-02-24 DIAGNOSIS — J4530 Mild persistent asthma, uncomplicated: Secondary | ICD-10-CM

## 2021-02-24 DIAGNOSIS — J4520 Mild intermittent asthma, uncomplicated: Secondary | ICD-10-CM

## 2021-02-24 DIAGNOSIS — N951 Menopausal and female climacteric states: Secondary | ICD-10-CM

## 2021-07-12 ENCOUNTER — Other Ambulatory Visit: Payer: Self-pay | Admitting: Primary Care

## 2021-07-12 DIAGNOSIS — J4520 Mild intermittent asthma, uncomplicated: Secondary | ICD-10-CM

## 2021-10-07 ENCOUNTER — Other Ambulatory Visit: Payer: Self-pay | Admitting: Primary Care

## 2021-10-07 DIAGNOSIS — J4520 Mild intermittent asthma, uncomplicated: Secondary | ICD-10-CM

## 2021-10-07 DIAGNOSIS — N951 Menopausal and female climacteric states: Secondary | ICD-10-CM

## 2021-10-07 NOTE — Telephone Encounter (Signed)
Please call patient:  She is significantly overdue for follow up. Will need to be scheduled before I can continue to provide refills.   Also confirm pharmacy.

## 2021-10-12 NOTE — Telephone Encounter (Signed)
Patient has moved to [place] recently and has not established with new provider. Wanted to know if she can get virtual to do refills to last a little longer to get her in with new provider.

## 2021-10-13 NOTE — Telephone Encounter (Signed)
I have scheduled virtual with patient. She has been out of her medications for about three days.

## 2021-10-13 NOTE — Telephone Encounter (Signed)
Noted. Refill(s) sent to pharmacy.  

## 2021-10-13 NOTE — Telephone Encounter (Signed)
Yes, please set up virtual visit for refills.  Does she have enough albuterol venlafaxine to last until virtual visit?

## 2021-10-19 ENCOUNTER — Telehealth (INDEPENDENT_AMBULATORY_CARE_PROVIDER_SITE_OTHER): Payer: Self-pay | Admitting: Primary Care

## 2021-10-19 ENCOUNTER — Other Ambulatory Visit: Payer: Self-pay

## 2021-10-19 ENCOUNTER — Encounter: Payer: Self-pay | Admitting: Primary Care

## 2021-10-19 DIAGNOSIS — N951 Menopausal and female climacteric states: Secondary | ICD-10-CM

## 2021-10-19 DIAGNOSIS — J4530 Mild persistent asthma, uncomplicated: Secondary | ICD-10-CM

## 2021-10-19 DIAGNOSIS — J4520 Mild intermittent asthma, uncomplicated: Secondary | ICD-10-CM

## 2021-10-19 MED ORDER — ALBUTEROL SULFATE (2.5 MG/3ML) 0.083% IN NEBU: 2.5000 mg | INHALATION_SOLUTION | Freq: Four times a day (QID) | RESPIRATORY_TRACT | 0 refills | Status: DC | PRN

## 2021-10-19 NOTE — Patient Instructions (Signed)
It was a pleasure to see you today!   

## 2021-10-19 NOTE — Assessment & Plan Note (Signed)
Controlled on Flovent 110 mcg BID, continue same. Continue PRN albuterol inhaler and nebulizer.  Will continue to refill until she can establish with a new PCP locally.

## 2021-10-19 NOTE — Assessment & Plan Note (Signed)
Moderate improvement on venlafaxine ER 37.5 mg, continue same.   Will continue to refill until she can establish with local PCP.

## 2021-10-19 NOTE — Progress Notes (Signed)
Patient ID: Beverly Wright, female    DOB: January 21, 1971, 51 y.o.   MRN: [id number]  Virtual visit completed through [employer], a video enabled telemedicine application. Due to national recommendations of social distancing due to COVID-19, a virtual visit is felt to be most appropriate for this patient at this time. Reviewed limitations, risks, security and privacy concerns of performing a virtual visit and the availability of in person appointments. I also reviewed that there may be a patient responsible charge related to this service. The patient agreed to proceed.   Patient location: home Provider location: [place] at [facility], office Persons participating in this virtual visit: patient, provider   If any vitals were documented, they were collected by patient at home unless specified below.    Ht 5\' 7"  (1.702 m)    Wt 240 lb (108.9 kg)    LMP 11/10/2016    BMI 37.59 kg/m    CC: Follow up  Subjective:   HPI: Beverly Wright is a 51 y.o. female with a hitsory of asthma, chronic low back pain, spondylosis, ADHD, prediabetes, hot flashes, presenting on 10/19/2021 for follow up and medication refill.   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 Is looking for a local PCP, needs refills to get her through.   Managed on Flovent 110 mcg BID for asthma an is compliant daily. She feels well managed. Using albuterol inhaler or nebulizer as needed with colds or during seasonal changes.  Managed on venlafaxine XR 37.5 mg daily for hot flashes, overall doing well on this regimen for hot flashes and has noticed benefits of mood improvement.     Relevant past medical, surgical, family and social history reviewed and updated as indicated. Interim medical history since our last visit reviewed. Allergies and medications reviewed and updated. Outpatient Medications Prior to Visit  Medication Sig Dispense Refill   albuterol (PROVENTIL) (2.5 MG/3ML) 0.083% nebulizer  solution Take 3 mLs (2.5 mg total) by nebulization every 6 (six) hours as needed for wheezing or shortness of breath. 150 mL 0   albuterol (VENTOLIN HFA) 108 (90 Base) MCG/ACT inhaler Inhale 1-2 puffs into the lungs every 6 (six) hours as needed for wheezing or shortness of breath. Office visit required for further refills. 1 each 0   Calcium Carbonate Antacid (CALCIUM CARBONATE PO) Take by mouth in the morning and at bedtime.     Cholecalciferol (VITAMIN D PO) Take 1 capsule by mouth daily.     cyanocobalamin 500 MCG tablet Take 500 mcg by mouth daily.     FLOVENT HFA 110 MCG/ACT inhaler INHALE 1 TO 2 PUFFS BY MOUTH TWICE DAILY 12 g 3   venlafaxine XR (EFFEXOR-XR) 37.5 MG 24 hr capsule Take 1 capsule (37.5 mg total) by mouth daily with breakfast. For hot flashes. Office visit required for further refills. 30 capsule 0   amoxicillin-clavulanate (AUGMENTIN) 875-125 MG tablet Take 1 tablet by mouth 2 (two) times daily. 20 tablet 0   Ascorbic Acid (VITAMIN C PLUS WILD ROSE HIPS PO) Take by mouth.     buPROPion (WELLBUTRIN SR) 150 MG 12 hr tablet Take 1 tablet by mouth twice daily 60 tablet 0   Multiple Vitamin (MULTIVITAMIN) tablet Take 1 tablet by mouth daily.     No facility-administered medications prior to visit.     Per HPI unless specifically indicated in ROS section below Review of Systems  Respiratory:  Negative for shortness of breath.   Cardiovascular:  Negative for  chest pain.  Genitourinary:        Hot flashes improved  Psychiatric/Behavioral:  The patient is not nervous/anxious.   Objective:  Ht 5\' 7"  (1.702 m)    Wt 240 lb (108.9 kg)    LMP 11/10/2016    BMI 37.59 kg/m   Wt Readings from Last 3 Encounters:  10/19/21 240 lb (108.9 kg)  08/27/20 240 lb (108.9 kg)  07/27/20 240 lb (108.9 kg)       Physical exam: General: Alert and oriented x 3, no distress, does not appear sickly  Pulmonary: Speaks in complete sentences without increased work of breathing, no cough during  visit.  Psychiatric: Normal mood, thought content, and behavior.     Results for orders placed or performed in visit on 10/29/20  Novel Coronavirus, NAA ([facility])   Specimen: Nasopharyngeal(NP) swabs in vial transport medium   Nasopharynge  Screenin  Result Value Ref Range   SARS-CoV-2, NAA Not Detected Not Detected  SARS-COV-2, NAA 2 DAY TAT   Nasopharynge  Screenin  Result Value Ref Range   SARS-CoV-2, NAA 2 DAY TAT Performed    Assessment & Plan:   Problem List Items Addressed This Visit       Respiratory   Asthma    Controlled on Flovent 110 mcg BID, continue same. Continue PRN albuterol inhaler and nebulizer.  Will continue to refill until she can establish with a new PCP locally.        Other   Vasomotor symptoms due to menopause    Moderate improvement on venlafaxine ER 37.5 mg, continue same.   Will continue to refill until she can establish with local PCP.        No orders of the defined types were placed in this encounter.  No orders of the defined types were placed in this encounter.   I discussed the assessment and treatment plan with the patient. The patient was provided an opportunity to ask questions and all were answered. The patient agreed with the plan and demonstrated an understanding of the instructions. The patient was advised to call back or seek an in-person evaluation if the symptoms worsen or if the condition fails to improve as anticipated.  Follow up plan:  It was a pleasure to see you today!   Pleas Koch, NP

## 2021-11-25 ENCOUNTER — Telehealth: Payer: Self-pay

## 2021-11-25 DIAGNOSIS — N951 Menopausal and female climacteric states: Secondary | ICD-10-CM

## 2021-11-25 MED ORDER — VENLAFAXINE HCL ER 37.5 MG PO CP24: 37.5000 mg | ORAL_CAPSULE | Freq: Every day | ORAL | 0 refills | Status: DC

## 2021-11-25 NOTE — Addendum Note (Signed)
Addended by: Pleas Koch on: 11/25/2021 08:36 PM   Modules accepted: Orders

## 2021-11-25 NOTE — Telephone Encounter (Signed)
Yes, I did tell her that. Refill(s) sent to pharmacy.

## 2021-11-25 NOTE — Telephone Encounter (Signed)
Called patient she has not been seen in office in very long time. States that when she had virtual last you told her you would refill until she is able to get new pcp where she moved to. She would like refill sent in to [facility] in [place]

## 2022-12-14 ENCOUNTER — Emergency Department (HOSPITAL_COMMUNITY)
Admission: EM | Admit: 2022-12-14 | Discharge: 2022-12-14 | Disposition: A | Discharge status: Home / Self Care | Payer: PRIVATE HEALTH INSURANCE | Attending: Emergency Medicine | Admitting: Emergency Medicine

## 2022-12-14 ENCOUNTER — Other Ambulatory Visit: Payer: Self-pay

## 2022-12-14 ENCOUNTER — Encounter (HOSPITAL_COMMUNITY): Payer: Self-pay

## 2022-12-14 DIAGNOSIS — J029 Acute pharyngitis, unspecified: Secondary | ICD-10-CM | POA: Diagnosis present

## 2022-12-14 DIAGNOSIS — Z1152 Encounter for screening for COVID-19: Secondary | ICD-10-CM | POA: Diagnosis not present

## 2022-12-14 DIAGNOSIS — J45909 Unspecified asthma, uncomplicated: Secondary | ICD-10-CM | POA: Insufficient documentation

## 2022-12-14 DIAGNOSIS — J020 Streptococcal pharyngitis: Secondary | ICD-10-CM | POA: Diagnosis not present

## 2022-12-14 LAB — RESP PANEL BY RT-PCR (RSV, FLU A&B, COVID)  RVPGX2
Influenza A by PCR: NEGATIVE
Influenza B by PCR: NEGATIVE
Resp Syncytial Virus by PCR: NEGATIVE
SARS Coronavirus 2 by RT PCR: NEGATIVE

## 2022-12-14 LAB — GROUP A STREP BY PCR: Group A Strep by PCR: DETECTED — AB

## 2022-12-14 MED ORDER — PENICILLIN G BENZATHINE 1200000 UNIT/2ML IM SUSY
1.2000 10*6.[IU] | PREFILLED_SYRINGE | Freq: Once | INTRAMUSCULAR | Status: AC
  Administered 2022-12-14: 1.2 10*6.[IU] via INTRAMUSCULAR
  Filled 2022-12-14 (×2): qty 2

## 2022-12-14 MED ORDER — DEXAMETHASONE 10 MG/ML FOR PEDIATRIC ORAL USE
10.0000 mg | Freq: Once | INTRAMUSCULAR | Status: AC
  Administered 2022-12-14: 10 mg via ORAL
  Filled 2022-12-14: qty 1

## 2022-12-14 NOTE — Discharge Instructions (Addendum)
Use Tylenol every 4 hours and ibuprofen every 6 hours as needed for pain or fever.  Stay well-hydrated.  Work note provided.

## 2022-12-14 NOTE — ED Triage Notes (Signed)
Fevers, body aches, sore throat, headache since yesterday

## 2022-12-14 NOTE — ED Provider Notes (Signed)
Beverly Wright Provider Note   CSN: [id number] Arrival date & time: 12/14/22  [id number]     History  Chief Complaint  Patient presents with   Generalized Body Aches    Beverly Wright is a 52 y.o. female.  Patient presents with fever, body aches, sore throat and headache since yesterday.  Family member with similar.  Patient been taking Tylenol ibuprofen for symptoms.  Patient has asthma history controlled.       Home Medications Prior to Admission medications   Medication Sig Start Date End Date Taking? Authorizing Provider  albuterol (PROVENTIL) (2.5 MG/3ML) 0.083% nebulizer solution Take 3 mLs (2.5 mg total) by nebulization every 6 (six) hours as needed for wheezing or shortness of breath. 10/19/21   Pleas Koch, NP  albuterol (VENTOLIN HFA) 108 (90 Base) MCG/ACT inhaler Inhale 1-2 puffs into the lungs every 6 (six) hours as needed for wheezing or shortness of breath. Office visit required for further refills. 10/13/21   Pleas Koch, NP  Calcium Carbonate Antacid (CALCIUM CARBONATE PO) Take by mouth in the morning and at bedtime.    [provider]  Cholecalciferol (VITAMIN D PO) Take 1 capsule by mouth daily.    [provider]  cyanocobalamin 500 MCG tablet Take 500 mcg by mouth daily.    [provider]  FLOVENT HFA 110 MCG/ACT inhaler INHALE 1 TO 2 PUFFS BY MOUTH TWICE DAILY 02/25/21   Pleas Koch, NP  venlafaxine XR (EFFEXOR-XR) 37.5 MG 24 hr capsule Take 1 capsule (37.5 mg total) by mouth daily with breakfast. For hot flashes. 11/25/21   Pleas Koch, NP      Allergies    Patient has no known allergies.    Review of Systems   Review of Systems  Constitutional:  Positive for appetite change and fever. Negative for chills.  Eyes:  Negative for visual disturbance.  Respiratory:  Negative for shortness of breath.   Cardiovascular:  Negative for chest pain.  Gastrointestinal:   Negative for abdominal pain and vomiting.  Genitourinary:  Negative for dysuria and flank pain.  Musculoskeletal:  Positive for myalgias. Negative for back pain, neck pain and neck stiffness.  Skin:  Negative for rash.  Neurological:  Negative for light-headedness and headaches.    Physical Exam Updated Vital Signs BP 134/85 (BP Location: Right Arm)   Pulse 72   Temp 98.8 F (37.1 C) (Oral)   Resp 20   Wt 108.9 kg   LMP 11/10/2016   SpO2 100%   BMI 37.59 kg/m  Physical Exam Vitals and nursing note reviewed.  Constitutional:      General: She is not in acute distress.    Appearance: She is well-developed.  HENT:     Head: Normocephalic and atraumatic.     Comments: Exudate and erythema without signs of abscess, no trismus or meningismus    Mouth/Throat:     Mouth: Mucous membranes are moist.  Eyes:     General:        Right eye: No discharge.        Left eye: No discharge.     Conjunctiva/sclera: Conjunctivae normal.  Neck:     Trachea: No tracheal deviation.  Cardiovascular:     Rate and Rhythm: Normal rate and regular rhythm.  Pulmonary:     Effort: Pulmonary effort is normal.     Breath sounds: Normal breath sounds.  Abdominal:     General: There is no  distension.     Palpations: Abdomen is soft.     Tenderness: There is no abdominal tenderness. There is no guarding.  Musculoskeletal:        General: Normal range of motion.     Cervical back: Normal range of motion and neck supple. No rigidity.  Skin:    General: Skin is warm.     Capillary Refill: Capillary refill takes less than 2 seconds.     Findings: No rash.  Neurological:     General: No focal deficit present.     Mental Status: She is alert.     Cranial Nerves: No cranial nerve deficit.  Psychiatric:        Mood and Affect: Mood normal.     ED Results / Procedures / Treatments   Labs (all labs ordered are listed, but only abnormal results are displayed) Labs Reviewed  GROUP A STREP BY PCR -  Abnormal; Notable for the following components:      Result Value   Group A Strep by PCR DETECTED (*)    All other components within normal limits  RESP PANEL BY RT-PCR (RSV, FLU A&B, COVID)  [id number]    EKG None  Radiology No results found.  Procedures Procedures    Medications Ordered in ED Medications  penicillin g benzathine (BICILLIN LA) 1200000 UNIT/2ML injection 1.2 Million Units (has no administration in time range)  dexamethasone (DECADRON) 10 MG/ML injection for Pediatric ORAL use 10 mg (10 mg Oral Given 12/14/22 1033)    ED Course/ Medical Decision Making/ A&P                             Medical Decision Making Risk Prescription drug management.   Patient presents with clinical concern for pharyngitis/viral/flulike illness.  No signs of peritonsillar abscess or serious bacterial fraction.  Viral testing sent, strep test sent as well and independently reviewed results positive.  Bicillin shot given.  Outpatient follow-up discussed and work note given.  Decadron given for symptoms.        Final Clinical Impression(s) / ED Diagnoses Final diagnoses:  Strep pharyngitis    Rx / DC Orders ED Discharge Orders     None         Beverly Morrison, MD 12/14/22 1132

## 2022-12-18 ENCOUNTER — Telehealth: Payer: Self-pay

## 2022-12-18 NOTE — Transitions of Care (Post Inpatient/ED Visit) (Signed)
   12/18/2022  Name: Beverly Wright MRN: [id number] DOB: 1970-12-19  Today's TOC FU Call Status: Today's TOC FU Call Status:: Successful TOC FU Call Competed TOC FU Call Complete Date: 12/18/22  Transition Care Management Follow-up Telephone Call Date of Discharge: 12/14/22 Discharge [facility]) Type of Discharge: Emergency Department Reason for ED Visit: Other: (Strep pharyngitis) How have you been since you were released from the hospital?: Better Any questions or concerns?: No  Items Reviewed: Did you receive and understand the discharge instructions provided?: Yes Medications obtained and verified?: Yes (Medications Reviewed) Any new allergies since your discharge?: No Dietary orders reviewed?: Yes Do you have support at home?: Yes  Home Care and Equipment/Supplies: [place] Ordered?: No Any new equipment or medical supplies ordered?: No  Functional Questionnaire: Do you need assistance with bathing/showering or dressing?: No Do you need assistance with meal preparation?: No Do you need assistance with eating?: No Do you have difficulty maintaining continence: No Do you need assistance with getting out of bed/getting out of a chair/moving?: No Do you have difficulty managing or taking your medications?: No  Folllow up appointments reviewed: PCP Follow-up appointment confirmed?: No (refused) MD Provider Line [phone] Given: Yes [facility] Follow-up appointment confirmed?: NA Do you need transportation to your follow-up appointment?: No Do you understand care options if your condition(s) worsen?: Yes-patient verbalized understanding    Ralston LPN [place] [facility] [phone]

## 2023-06-26 ENCOUNTER — Other Ambulatory Visit: Payer: Self-pay

## 2023-06-26 ENCOUNTER — Emergency Department
Admission: EM | Admit: 2023-06-26 | Discharge: 2023-06-26 | Disposition: A | Discharge status: Home / Self Care | Payer: Self-pay | Attending: Emergency Medicine | Admitting: Emergency Medicine

## 2023-06-26 ENCOUNTER — Emergency Department: Payer: Self-pay

## 2023-06-26 DIAGNOSIS — M25551 Pain in right hip: Secondary | ICD-10-CM | POA: Diagnosis not present

## 2023-06-26 DIAGNOSIS — M25531 Pain in right wrist: Secondary | ICD-10-CM | POA: Insufficient documentation

## 2023-06-26 DIAGNOSIS — J45909 Unspecified asthma, uncomplicated: Secondary | ICD-10-CM | POA: Insufficient documentation

## 2023-06-26 DIAGNOSIS — M542 Cervicalgia: Secondary | ICD-10-CM | POA: Insufficient documentation

## 2023-06-26 DIAGNOSIS — Y92410 Unspecified street and highway as the place of occurrence of the external cause: Secondary | ICD-10-CM | POA: Diagnosis not present

## 2023-06-26 MED ORDER — HYDROCODONE-ACETAMINOPHEN 5-325 MG PO TABS
1.0000 | ORAL_TABLET | Freq: Once | ORAL | Status: AC
  Administered 2023-06-26: 1 via ORAL
  Filled 2023-06-26: qty 1
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
## 2023-06-26 NOTE — Discharge Instructions (Addendum)
Please use ibuprofen (Motrin) up to 800 mg every 8 hours, naproxen (Naprosyn) up to 500 mg every 12 hours, and/or acetaminophen (Tylenol) up to 4 g/day for any continued pain.  Please do not use this medication regimen for longer than 7 days
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
## 2023-07-05 ENCOUNTER — Encounter: Payer: Self-pay | Admitting: Primary Care

## 2023-07-05 ENCOUNTER — Ambulatory Visit (INDEPENDENT_AMBULATORY_CARE_PROVIDER_SITE_OTHER): Payer: Self-pay | Admitting: Primary Care

## 2023-07-05 ENCOUNTER — Ambulatory Visit (INDEPENDENT_AMBULATORY_CARE_PROVIDER_SITE_OTHER)
Admission: RE | Admit: 2023-07-05 | Discharge: 2023-07-05 | Disposition: A | Discharge status: Home / Self Care | Payer: Self-pay | Source: Ambulatory Visit | Attending: Primary Care | Admitting: Primary Care

## 2023-07-05 VITALS — BP 134/82 | HR 85 | Temp 97.5°F | Ht 67.0 in | Wt 255.0 lb

## 2023-07-05 DIAGNOSIS — R202 Paresthesia of skin: Secondary | ICD-10-CM

## 2023-07-05 DIAGNOSIS — M542 Cervicalgia: Secondary | ICD-10-CM

## 2023-07-05 DIAGNOSIS — M79642 Pain in left hand: Secondary | ICD-10-CM | POA: Insufficient documentation

## 2023-07-05 MED ORDER — CYCLOBENZAPRINE HCL 5 MG PO TABS: 5.0000 mg | ORAL_TABLET | Freq: Three times a day (TID) | ORAL | 0 refills | Status: DC | PRN

## 2023-07-05 MED ORDER — PREDNISONE 20 MG PO TABS: ORAL_TABLET | ORAL | 0 refills | Status: DC

## 2023-07-05 NOTE — Assessment & Plan Note (Addendum)
Reviewed ED notes and imaging. Some symptoms representative of concussive syndrome.  Continue brain rest.  Checking plain films of the cervical spine given extent of neck pain and cervical spine tenderness. MRI of left hand ordered and pending given paresthesias and ongoing swelling/pain.  Discussed that she will continue to be sore for several weeks.  Agreed to complete continuous FMLA with start date of 06/26/2023 with return to work date of 07/25/2023. She will have employer send paperwork.

## 2023-07-05 NOTE — Assessment & Plan Note (Signed)
Posttraumatic.  Cervical spine plain films ordered and pending today.  Start Flexeril 5 mg 3 times daily as needed.  Drowsiness precautions provided.  Start prednisone 20 mg. Take 3 tablets my mouth once daily in the morning for 3 days, then 2 tablets for 3 days, then 1 tablet for 3 days.  Await results.

## 2023-07-05 NOTE — Patient Instructions (Signed)
Complete xray(s) prior to leaving today. I will notify you of your results once received.  Start prednisone 20 mg medicine for inflammation. Take 3 tablets my mouth once daily in the morning for 3 days, then 2 tablets for 3 days, then 1 tablet for 3 days.  You may take cyclobenzaprine muscle relaxer every 8 hours as needed.  This may cause drowsiness.  You will be contacted regarding the MRI of your hand.  Please have your employer send FMLA paperwork.  It was a pleasure to see you today!

## 2023-07-05 NOTE — Assessment & Plan Note (Signed)
Posttraumatic. MRI of the left hand ordered and pending.

## 2023-07-06 DIAGNOSIS — R233 Spontaneous ecchymoses: Secondary | ICD-10-CM

## 2023-07-06 NOTE — Telephone Encounter (Signed)
Kelli, will you print and place in my inbox?  I tried printing it on 07/06/2023 but the printer toner is nearly out.
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
## 2023-07-12 ENCOUNTER — Encounter: Payer: Self-pay | Admitting: Family Medicine

## 2023-07-12 ENCOUNTER — Telehealth: Payer: Self-pay

## 2023-07-12 ENCOUNTER — Ambulatory Visit (INDEPENDENT_AMBULATORY_CARE_PROVIDER_SITE_OTHER): Payer: Self-pay | Admitting: Family Medicine

## 2023-07-12 ENCOUNTER — Ambulatory Visit (INDEPENDENT_AMBULATORY_CARE_PROVIDER_SITE_OTHER)
Admission: RE | Admit: 2023-07-12 | Discharge: 2023-07-12 | Disposition: A | Discharge status: Home / Self Care | Payer: Self-pay | Source: Ambulatory Visit | Attending: Family Medicine | Admitting: Family Medicine

## 2023-07-12 VITALS — BP 150/80 | HR 90 | Temp 97.6°F | Ht 67.0 in | Wt 262.0 lb

## 2023-07-12 DIAGNOSIS — M542 Cervicalgia: Secondary | ICD-10-CM

## 2023-07-12 DIAGNOSIS — H538 Other visual disturbances: Secondary | ICD-10-CM

## 2023-07-12 DIAGNOSIS — S060X0D Concussion without loss of consciousness, subsequent encounter: Secondary | ICD-10-CM

## 2023-07-12 DIAGNOSIS — R519 Headache, unspecified: Secondary | ICD-10-CM

## 2023-07-12 DIAGNOSIS — M545 Low back pain, unspecified: Secondary | ICD-10-CM

## 2023-07-12 DIAGNOSIS — R233 Spontaneous ecchymoses: Secondary | ICD-10-CM

## 2023-07-12 DIAGNOSIS — R4020 Unspecified coma: Secondary | ICD-10-CM

## 2023-07-12 DIAGNOSIS — R202 Paresthesia of skin: Secondary | ICD-10-CM

## 2023-07-12 DIAGNOSIS — M79641 Pain in right hand: Secondary | ICD-10-CM

## 2023-07-12 DIAGNOSIS — M79642 Pain in left hand: Secondary | ICD-10-CM

## 2023-07-12 DIAGNOSIS — R200 Anesthesia of skin: Secondary | ICD-10-CM

## 2023-07-12 NOTE — Telephone Encounter (Signed)
Attempted to contact pt again using phn # [phone] in pt's chart. However, person answering states there is no one at this number by that name.   Sending a MyChart message requesting info for to complete urgent CT head order.

## 2023-07-12 NOTE — Patient Instructions (Signed)
Mild Traumatic Brain Injury (CONCUSSSION):  Beverly Dayley, MD, [facility] Adapted from [facility], [facility], and [employer]  Think of the human brain almost as an egg yolk and your skull as an egg shell. When your head or body takes a hit, it can cause your brain to shake around inside your skull, injuring the brain. A concussion is not only caused by a hit to the head, but can also be caused by an impact to the body that ends up shaking your brain in your skull, such as whiplash.  A common misconception about concussion is that one loses consciousness. However, loss of consciousness occurs in only less than 10% of concussion cases.  Concussive symptoms typically resolve in 7 to 10 days (sports-related concussions) or within 3 months (non-athletes).  Approximately 20% of people have symptoms after 6 weeks.  With concussions, we have learned that it generally takes youth longer to recover from concussions. Another thing that we now know about concussions is that it usually takes female athletes longer to recover than female athletes.  No two concussions are identical. In fact, there are at least six different clinical trajectories that concussions may take.  Signs and Symptoms of Concussion:  The signs and symptoms of a concussion are incredibly important because a concussion doesn't show up on imaging like an x-ray, CT, or MRI scan and there is no objective test, like a blood or saliva test, that can determine if a patient has a concussion. A doctor makes a concussion diagnosis based on the results of a comprehensive examination, which includes observing signs of concussion and patients reporting symptoms of concussion appearing after an impact to the head or body. Concussion signs and symptoms are the brain's way of showing it is injured and not functioning normally.  CONCUSSION SIGNS  Concussion signs are what someone could observe about you to  determine if you have a concussion.   Common concussion signs include:  Loss of consciousness Problems with balance Glazed look in the eyes Amnesia Delayed response to questions Forgetting an instruction, confusion about an assignment or position, or  confusion of the game, score, or opponent Inappropriate crying Inappropriate laughter Vomiting  CONCUSSION SYMPTOMS  Concussion symptoms are what someone who is concussed will tell you that they are experiencing. Concussion symptoms typically fall into six major categories:  1- Somatic (Physical) Symptoms  Headache Light-headedness Dizziness Nausea Sensitivity to light Sensitivity to noise  2- Cognitive Symptoms  Difficulties with attention Memory problems Loss of focus Difficulty multitasking Difficulty completing mental tasks  3- Sleep Symptoms  Sleeping more than usual Sleeping less than usual Having trouble falling asleep  4- Emotional Symptoms  Anxiety Depression Panic attacks  5 - Vestibular Symptoms  The vestibular system is affected in nearly 60% of youth and adolescent athletes following a concussion.  But what is the vestibular system?  It's the sensory system that helps with your sense of balance and spatial orientation. Think of a gyroscope! With help from your inner ears, your vestibular system detects the motion or position of your head in space. It sends information to your brain that's needed for balance and stable vision.  Need an example? If you're moving and looking at moving objects at the same time (think riding in a car), you're able to stay focused and not lose visual clarity. During a vestibular concussion, your gyroscope isn't working at full potential.  Difficulty with balance Dizziness. It may feel like the room is spinning   or a slow, wavy sensation. (Like you're on a boat!) Trouble stabilizing vision when moving your head. (We call this Vestibular-Ocular Reflux, or VOR.) Think  back to riding in a car. With a vestibular concussion, you can't stay focused. (The technical name for this is Visual Motion Sensitivity, or VMS.) Triggers  These 3 things could bring on vestibular symptoms:  Dynamic movements Busy environments, like the grocery store Crowds  6 - Oculo-motor  A concussion that affects the ocular, or visual, system of the brain. Typically, patients with ocular-motor concussions report pressure headaches in the front of their head, feeling more tired than normal, and becoming more symptomatic doing math or science exercises at school.  Patients also experience difficulties with their eyes working together. Follow along to explore some of the most common symptoms.  Convergence  The eyes converge when viewing objects up close, such as with reading. With convergence problems, patients may see a double image as a target moves closer to them. Typically, without a concussion, objects can be brought very close without doubling.  Accommodation  Accommodation problems cause an object to become blurry as it is viewed up close. Accommodative and convergence problems are often experienced together and can impact reading and other near-vision activities.  Pursuits and Saccades  The eyes use pursuit eye movements to follow objects; while saccade eyes movements allow the eyes to shift rapidly from one object to another. Tracking objects, reading a book, scrolling on a computer or even watching for a moving car while crossing the street can be difficult when patients have problems with pursuit and saccade eye movements.  Misalignment  When people with eye misalignment (one eye drifts, eyes aren't perfectly aligned) sustain a concussion, the brain may have difficulty compensating for the misalignment like it once did. This can result in blurry vision, difficulty taking notes in class and focusing on the chalkboard.  Note: This is not an exhaustive list of concussion  signs and symptoms, and it may take a few days for concussion symptoms to appear after the initial injury.  TREATMENT:  THE MOST IMPORTANT THING IS REST AS SOON AS POSSIBLE AFTER INJURY SO THAT THE BRAIN CAN RECOVER. COMPLETE PHYSICAL AND MENTAL REST ARE NEEDED INITIALLY.   THAT MEANS: NO SCHOOL OR WORK FOR AT LEAST 3 DAYS AND CLEARED BY YOUR DOCTOR NO MENTAL EXERTION, MEANING NO WORK, NO HOMEWORK, NO TEST TAKING.  Avoid situations with loud noise, bright lights, or crowds. However, this doesn't mean isolate yourself in a dark room for a week. Too much isolation and boredom can be harmful, contributing to feelings of anxiety, depression, and resulting in increased recovery time. Spend time with friends and family, but monitor your symptoms and avoid situations that make you feel worse.   NO VIDEO GAMES, NO USING THE COMPUTER, NO TEXTING, NO USING SMARTPHONES, NO USE OF AN IPAD OR TABLET. DO NOT GO TO A MOVIE THEATRE OR WATCH SPORTS ON TV. HDTV TENDS TO MAKE PEOPLE FEEL WORSE.   ON APPROXIMATELY DAY 4, BEGIN VERY LIGHT EXERCISE SUCH AS WALKING. DO NOT START ANY STRENUOUS EXERCISE.   You will be given return to school or return to work recommendations.  
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
## 2023-07-13 ENCOUNTER — Ambulatory Visit
Admission: RE | Admit: 2023-07-13 | Discharge: 2023-07-13 | Disposition: A | Discharge status: Home / Self Care | Payer: Self-pay | Source: Ambulatory Visit | Attending: Family Medicine | Admitting: Family Medicine

## 2023-07-13 DIAGNOSIS — R200 Anesthesia of skin: Secondary | ICD-10-CM | POA: Diagnosis not present

## 2023-07-13 DIAGNOSIS — S060X0D Concussion without loss of consciousness, subsequent encounter: Secondary | ICD-10-CM | POA: Diagnosis present

## 2023-07-13 DIAGNOSIS — R4020 Unspecified coma: Secondary | ICD-10-CM | POA: Diagnosis not present

## 2023-07-13 DIAGNOSIS — R519 Headache, unspecified: Secondary | ICD-10-CM | POA: Diagnosis not present

## 2023-07-13 DIAGNOSIS — R202 Paresthesia of skin: Secondary | ICD-10-CM | POA: Insufficient documentation

## 2023-07-13 DIAGNOSIS — H538 Other visual disturbances: Secondary | ICD-10-CM | POA: Diagnosis present

## 2023-07-13 LAB — CBC WITH DIFFERENTIAL/PLATELET
Absolute Monocytes: 641 {cells}/uL (ref 200–950)
Basophils Absolute: 53 {cells}/uL (ref 0–200)
Basophils Relative: 0.5 %
Eosinophils Absolute: 273 {cells}/uL (ref 15–500)
Eosinophils Relative: 2.6 %
HCT: 39.1 % (ref 35.0–45.0)
Hemoglobin: 12.7 g/dL (ref 11.7–15.5)
Lymphs Abs: 3602 {cells}/uL (ref 850–3900)
MCH: 29.3 pg (ref 27.0–33.0)
MCHC: 32.5 g/dL (ref 32.0–36.0)
MCV: 90.1 fL (ref 80.0–100.0)
MPV: 11.3 fL (ref 7.5–12.5)
Monocytes Relative: 6.1 %
Neutro Abs: 5933 {cells}/uL (ref 1500–7800)
Neutrophils Relative %: 56.5 %
Platelets: 317 10*3/uL (ref 140–400)
RBC: 4.34 10*6/uL (ref 3.80–5.10)
RDW: 13.7 % (ref 11.0–15.0)
Total Lymphocyte: 34.3 %
WBC: 10.5 10*3/uL (ref 3.8–10.8)

## 2023-07-13 LAB — PATHOLOGIST SMEAR REVIEW
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
## 2023-07-13 NOTE — Addendum Note (Signed)
Addended by: Hannah Beat on: 07/13/2023 11:53 AM   Modules accepted: Orders

## 2023-07-13 NOTE — Telephone Encounter (Signed)
STAT CT is reordered with additional information requested.

## 2023-07-22 ENCOUNTER — Ambulatory Visit
Admission: RE | Admit: 2023-07-22 | Discharge: 2023-07-22 | Disposition: A | Discharge status: Home / Self Care | Payer: No Typology Code available for payment source | Source: Ambulatory Visit | Attending: Primary Care | Admitting: Primary Care

## 2023-07-22 DIAGNOSIS — M79642 Pain in left hand: Secondary | ICD-10-CM

## 2023-07-22 DIAGNOSIS — R202 Paresthesia of skin: Secondary | ICD-10-CM
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
## 2023-07-26 ENCOUNTER — Ambulatory Visit (INDEPENDENT_AMBULATORY_CARE_PROVIDER_SITE_OTHER): Payer: Self-pay | Admitting: Family Medicine

## 2023-07-26 ENCOUNTER — Encounter: Payer: Self-pay | Admitting: Family Medicine

## 2023-07-26 ENCOUNTER — Ambulatory Visit: Payer: Self-pay | Admitting: Family Medicine

## 2023-07-26 VITALS — BP 128/82 | HR 107 | Temp 97.7°F | Ht 67.0 in | Wt 257.4 lb

## 2023-07-26 DIAGNOSIS — S060X0D Concussion without loss of consciousness, subsequent encounter: Secondary | ICD-10-CM

## 2023-07-26 DIAGNOSIS — M542 Cervicalgia: Secondary | ICD-10-CM

## 2023-07-26 DIAGNOSIS — M545 Low back pain, unspecified: Secondary | ICD-10-CM

## 2023-07-26 DIAGNOSIS — M79642 Pain in left hand: Secondary | ICD-10-CM

## 2023-07-26 DIAGNOSIS — H538 Other visual disturbances: Secondary | ICD-10-CM

## 2023-07-26 DIAGNOSIS — G44309 Post-traumatic headache, unspecified, not intractable: Secondary | ICD-10-CM

## 2023-07-26 MED ORDER — NORTRIPTYLINE HCL 10 MG PO CAPS: 10.0000 mg | ORAL_CAPSULE | Freq: Every day | ORAL | 2 refills | Status: DC

## 2023-07-26 NOTE — Patient Instructions (Signed)
Recommend consultation with Neuro-developmental Optometrist [facility] Dr. Dory Larsen [phone]
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
## 2023-08-02 ENCOUNTER — Encounter: Payer: Self-pay | Admitting: Family Medicine

## 2023-08-03 MED ORDER — PROPRANOLOL HCL ER 60 MG PO CP24: 60.0000 mg | ORAL_CAPSULE | Freq: Every day | ORAL | 3 refills | Status: DC

## 2023-08-03 NOTE — Telephone Encounter (Signed)
I talked with Beverly Wright on the phone just now.  She is really struggling with headache and with neck and back pain.  I am going to have her discontinue nortriptyline given side effects.  We will avoid TCAs additionally in the future.  For postconcussive headache management, I am going to try her on some Inderal LA.  We will start this with 60 mg, she if tolerates this we can always increase this, as well.  With the neck and back pain this is challenging.  I am going to try to avoid sedating medicines given her ongoing concussion.  We talked about possibly doing a low-dose of gabapentin, but I would try this only at 100 mg to see if she can tolerate it.  There is the potential for sedation, she and I both decided that we would avoid this for right now.  She can take Tylenol and ibuprofen.  She also has upcoming physical therapy, which I think will help with her neck and back.

## 2023-08-06 ENCOUNTER — Encounter: Payer: Self-pay | Admitting: Family Medicine

## 2023-08-06 NOTE — Telephone Encounter (Signed)
FMLA paperwork printed and placed in Dr. Cyndie Chime office in box to complete.

## 2023-08-15 ENCOUNTER — Other Ambulatory Visit: Payer: Self-pay

## 2023-08-15 ENCOUNTER — Ambulatory Visit: Payer: Self-pay | Attending: Family Medicine

## 2023-08-15 DIAGNOSIS — M5416 Radiculopathy, lumbar region: Secondary | ICD-10-CM | POA: Insufficient documentation

## 2023-08-15 DIAGNOSIS — M542 Cervicalgia: Secondary | ICD-10-CM | POA: Insufficient documentation

## 2023-08-15 DIAGNOSIS — M545 Low back pain, unspecified: Secondary | ICD-10-CM | POA: Insufficient documentation
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
## 2023-08-23 ENCOUNTER — Ambulatory Visit (INDEPENDENT_AMBULATORY_CARE_PROVIDER_SITE_OTHER): Payer: Self-pay | Admitting: Family Medicine

## 2023-08-23 ENCOUNTER — Encounter: Payer: Self-pay | Admitting: Family Medicine

## 2023-08-23 VITALS — BP 100/72 | HR 107 | Temp 98.6°F | Ht 67.0 in | Wt 255.1 lb

## 2023-08-23 DIAGNOSIS — M542 Cervicalgia: Secondary | ICD-10-CM

## 2023-08-23 DIAGNOSIS — S060X0D Concussion without loss of consciousness, subsequent encounter: Secondary | ICD-10-CM

## 2023-08-23 DIAGNOSIS — R200 Anesthesia of skin: Secondary | ICD-10-CM

## 2023-08-23 DIAGNOSIS — M545 Low back pain, unspecified: Secondary | ICD-10-CM

## 2023-08-23 DIAGNOSIS — G44309 Post-traumatic headache, unspecified, not intractable: Secondary | ICD-10-CM

## 2023-08-23 DIAGNOSIS — R202 Paresthesia of skin: Secondary | ICD-10-CM

## 2023-08-23 DIAGNOSIS — R42 Dizziness and giddiness: Secondary | ICD-10-CM

## 2023-08-23 MED ORDER — PREDNISONE 20 MG PO TABS: ORAL_TABLET | ORAL | 0 refills | Status: DC

## 2023-08-24 ENCOUNTER — Encounter: Payer: Self-pay | Admitting: Family Medicine

## 2023-08-29 ENCOUNTER — Ambulatory Visit: Payer: Self-pay

## 2023-08-31 ENCOUNTER — Ambulatory Visit: Payer: Self-pay

## 2023-08-31 DIAGNOSIS — M542 Cervicalgia: Secondary | ICD-10-CM

## 2023-08-31 DIAGNOSIS — M5416 Radiculopathy, lumbar region: Secondary | ICD-10-CM
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
## 2023-09-04 ENCOUNTER — Ambulatory Visit: Payer: Self-pay

## 2023-09-04 DIAGNOSIS — M5416 Radiculopathy, lumbar region: Secondary | ICD-10-CM

## 2023-09-04 DIAGNOSIS — M542 Cervicalgia: Secondary | ICD-10-CM
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
## 2023-09-07 ENCOUNTER — Encounter: Payer: Self-pay | Admitting: Family Medicine

## 2023-09-07 DIAGNOSIS — G8929 Other chronic pain: Secondary | ICD-10-CM

## 2023-09-10 MED ORDER — PREGABALIN 75 MG PO CAPS: ORAL_CAPSULE | ORAL | 0 refills | Status: DC

## 2023-09-10 MED ORDER — PREGABALIN 150 MG PO CAPS: 150.0000 mg | ORAL_CAPSULE | Freq: Two times a day (BID) | ORAL | 2 refills | Status: DC

## 2023-09-10 NOTE — Addendum Note (Signed)
Addended by: Hannah Beat on: 09/10/2023 08:55 AM   Modules accepted: Orders

## 2023-09-11 ENCOUNTER — Ambulatory Visit: Payer: Self-pay

## 2023-09-13 ENCOUNTER — Encounter: Payer: Self-pay | Admitting: Physical Therapy

## 2023-09-13 ENCOUNTER — Ambulatory Visit: Payer: Self-pay | Attending: Family Medicine | Admitting: Physical Therapy

## 2023-09-13 DIAGNOSIS — M542 Cervicalgia: Secondary | ICD-10-CM | POA: Insufficient documentation

## 2023-09-13 DIAGNOSIS — M5416 Radiculopathy, lumbar region: Secondary | ICD-10-CM | POA: Diagnosis not present
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
## 2023-09-16 ENCOUNTER — Other Ambulatory Visit: Payer: No Typology Code available for payment source

## 2023-09-18 ENCOUNTER — Ambulatory Visit: Payer: Self-pay

## 2023-09-18 DIAGNOSIS — M542 Cervicalgia: Secondary | ICD-10-CM

## 2023-09-18 DIAGNOSIS — M5416 Radiculopathy, lumbar region: Secondary | ICD-10-CM
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
## 2023-09-20 ENCOUNTER — Ambulatory Visit (INDEPENDENT_AMBULATORY_CARE_PROVIDER_SITE_OTHER): Payer: Self-pay | Admitting: Family Medicine

## 2023-09-20 ENCOUNTER — Ambulatory Visit: Payer: Self-pay

## 2023-09-20 ENCOUNTER — Encounter: Payer: Self-pay | Admitting: *Deleted

## 2023-09-20 VITALS — BP 132/88 | HR 92 | Temp 98.0°F | Ht 67.0 in | Wt 257.5 lb

## 2023-09-20 DIAGNOSIS — M545 Low back pain, unspecified: Secondary | ICD-10-CM

## 2023-09-20 DIAGNOSIS — S060X0D Concussion without loss of consciousness, subsequent encounter: Secondary | ICD-10-CM

## 2023-09-20 DIAGNOSIS — M542 Cervicalgia: Secondary | ICD-10-CM

## 2023-09-20 DIAGNOSIS — F0781 Postconcussional syndrome: Secondary | ICD-10-CM

## 2023-09-20 MED ORDER — GABAPENTIN 100 MG PO CAPS: 100.0000 mg | ORAL_CAPSULE | Freq: Two times a day (BID) | ORAL | 3 refills | Status: DC

## 2023-09-20 NOTE — Patient Instructions (Signed)
Macks earplugs that you can put on your [place]

## 2023-09-21 ENCOUNTER — Encounter: Payer: Self-pay | Admitting: Family Medicine

## 2023-09-23 ENCOUNTER — Encounter: Payer: Self-pay | Admitting: Family Medicine

## 2023-09-24 NOTE — Telephone Encounter (Signed)
Form printed.  Will place in Dr. Cyndie Chime office in box to complete.

## 2023-09-26 NOTE — Telephone Encounter (Signed)
Completed forms faxed to [employer] at [phone].

## 2023-09-26 NOTE — Telephone Encounter (Signed)
done

## 2023-10-01 ENCOUNTER — Ambulatory Visit
Admission: RE | Admit: 2023-10-01 | Discharge: 2023-10-01 | Disposition: A | Discharge status: Home / Self Care | Payer: No Typology Code available for payment source | Source: Ambulatory Visit | Attending: Family Medicine | Admitting: Family Medicine

## 2023-10-01 DIAGNOSIS — M542 Cervicalgia: Secondary | ICD-10-CM

## 2023-10-01 DIAGNOSIS — M545 Low back pain, unspecified: Secondary | ICD-10-CM

## 2023-10-01 DIAGNOSIS — G44309 Post-traumatic headache, unspecified, not intractable: Secondary | ICD-10-CM

## 2023-10-01 DIAGNOSIS — R200 Anesthesia of skin: Secondary | ICD-10-CM

## 2023-10-11 ENCOUNTER — Ambulatory Visit: Payer: No Typology Code available for payment source | Admitting: Primary Care

## 2023-10-16 ENCOUNTER — Ambulatory Visit: Payer: Medicaid Other

## 2023-10-18 ENCOUNTER — Ambulatory Visit: Payer: Medicaid Other
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
## 2023-10-22 ENCOUNTER — Encounter: Payer: Self-pay | Admitting: Family Medicine

## 2023-10-22 ENCOUNTER — Ambulatory Visit (INDEPENDENT_AMBULATORY_CARE_PROVIDER_SITE_OTHER): Payer: Self-pay | Admitting: Family Medicine

## 2023-10-22 VITALS — BP 124/80 | HR 87 | Temp 97.4°F | Ht 67.0 in | Wt 249.1 lb

## 2023-10-22 DIAGNOSIS — M545 Low back pain, unspecified: Secondary | ICD-10-CM

## 2023-10-22 DIAGNOSIS — M4802 Spinal stenosis, cervical region: Secondary | ICD-10-CM | POA: Diagnosis not present

## 2023-10-22 DIAGNOSIS — F0781 Postconcussional syndrome: Secondary | ICD-10-CM | POA: Diagnosis not present

## 2023-10-22 DIAGNOSIS — G44309 Post-traumatic headache, unspecified, not intractable: Secondary | ICD-10-CM | POA: Diagnosis not present

## 2023-10-22 DIAGNOSIS — M5412 Radiculopathy, cervical region: Secondary | ICD-10-CM

## 2023-10-22 DIAGNOSIS — G8929 Other chronic pain: Secondary | ICD-10-CM

## 2023-10-22 DIAGNOSIS — M79642 Pain in left hand: Secondary | ICD-10-CM | POA: Diagnosis not present

## 2023-10-22 MED ORDER — PROPRANOLOL HCL ER 80 MG PO CP24: 80.0000 mg | ORAL_CAPSULE | Freq: Every day | ORAL | 3 refills | Status: DC

## 2023-10-22 NOTE — Patient Instructions (Addendum)
 You have been referred to neurosurgery and hand surgery

## 2023-10-23 ENCOUNTER — Ambulatory Visit: Payer: Medicaid Other

## 2023-10-26 ENCOUNTER — Encounter: Payer: Self-pay | Admitting: Physical Therapy

## 2023-10-26 ENCOUNTER — Ambulatory Visit: Payer: Medicaid Other | Attending: Family Medicine | Admitting: Physical Therapy

## 2023-10-26 DIAGNOSIS — M542 Cervicalgia: Secondary | ICD-10-CM

## 2023-10-26 DIAGNOSIS — M5416 Radiculopathy, lumbar region: Secondary | ICD-10-CM
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
## 2023-10-29 ENCOUNTER — Encounter: Payer: Self-pay | Admitting: Neurosurgery

## 2023-10-29 ENCOUNTER — Ambulatory Visit
Admission: RE | Admit: 2023-10-29 | Discharge: 2023-10-29 | Disposition: A | Discharge status: Home / Self Care | Payer: No Typology Code available for payment source | Attending: Neurosurgery | Admitting: Neurosurgery

## 2023-10-29 ENCOUNTER — Ambulatory Visit (INDEPENDENT_AMBULATORY_CARE_PROVIDER_SITE_OTHER): Payer: Medicaid Other | Admitting: Neurosurgery

## 2023-10-29 ENCOUNTER — Other Ambulatory Visit: Payer: Self-pay

## 2023-10-29 ENCOUNTER — Ambulatory Visit
Admission: RE | Admit: 2023-10-29 | Discharge: 2023-10-29 | Disposition: A | Discharge status: Home / Self Care | Payer: No Typology Code available for payment source | Source: Ambulatory Visit | Attending: Neurosurgery | Admitting: Neurosurgery

## 2023-10-29 VITALS — BP 128/86 | Ht 66.5 in | Wt 251.6 lb

## 2023-10-29 DIAGNOSIS — R29898 Other symptoms and signs involving the musculoskeletal system: Secondary | ICD-10-CM

## 2023-10-29 DIAGNOSIS — M50121 Cervical disc disorder at C4-C5 level with radiculopathy: Secondary | ICD-10-CM

## 2023-10-29 DIAGNOSIS — M5010 Cervical disc disorder with radiculopathy, unspecified cervical region: Secondary | ICD-10-CM | POA: Insufficient documentation

## 2023-10-29 DIAGNOSIS — M47812 Spondylosis without myelopathy or radiculopathy, cervical region: Secondary | ICD-10-CM

## 2023-10-29 DIAGNOSIS — M50122 Cervical disc disorder at C5-C6 level with radiculopathy: Secondary | ICD-10-CM | POA: Diagnosis not present

## 2023-10-29 DIAGNOSIS — M4802 Spinal stenosis, cervical region: Secondary | ICD-10-CM | POA: Diagnosis not present

## 2023-10-29 DIAGNOSIS — Z01818 Encounter for other preprocedural examination: Secondary | ICD-10-CM

## 2023-10-29 NOTE — Patient Instructions (Signed)
Please see below for information in regards to your upcoming surgery:   Planned surgery: C4-6 anterior cervical discectomy and fusion   Surgery date: 11/15/23 at [facility]: [address] [place], [place] [address]) - you will find out your arrival time the business day before your surgery.   Pre-op appointment at [facility] Pre-admit Testing: we will call you with a date/time for this. If you are scheduled for an in person appointment, Pre-admit Testing is located on the first floor of the [facility], [address] [facility], [address]. Please bring all prescriptions in the original prescription bottles to your appointment. During this appointment, they will advise you which medications you can take the morning of surgery, and which medications you will need to hold for surgery. Labs (such as blood work, EKG) may be done at your pre-op appointment. You are not required to fast for these labs. Should you need to change your pre-op appointment, please call Pre-admit testing at [phone].      Surgical clearance: we will send a clearance form to Beverly Rieger, NP. They may wish to see you in their office prior to signing the clearance form. If so, they may call you to schedule an appointment.    NSAIDS (Non-steroidal anti-inflammatory drugs): because you are having a fusion, please avoid taking any NSAIDS (examples: ibuprofen, motrin, aleve, naproxen, meloxicam, diclofenac) for 3 months after surgery. Celebrex is an exception and is OK to take, if prescribed. Tylenol is not an NSAID.    Common restrictions after surgery: No bending, lifting, or twisting ("BLT"). Avoid lifting objects heavier than 10 pounds for the first 6 weeks after surgery. Where possible, avoid household activities that involve lifting, bending, reaching, pushing, or pulling such as laundry, vacuuming, grocery shopping, and childcare. Try to arrange for help from  friends and family for these activities while you heal. Do not drive while taking prescription pain medication. Weeks 6 through 12 after surgery: avoid lifting more than 25 pounds.    X-rays after surgery: Because you are having a fusion or arthroplasty: for appointments after your 2 week follow-up: please arrive at the [facility] outpatient imaging center ([address], [address], [place]) or [facility] one hour prior to your appointment for x-rays. This applies to every appointment after your 2 week follow-up. Failure to do so may result in your appointment being rescheduled.   How to contact us:  If you have any questions/concerns before or after surgery, you can reach [place] at [phone], or you can send a mychart message. We can be reached by phone or mychart 8am-4pm, Monday-Friday.  *Please note: Calls after 4pm are forwarded to a third party answering service. Mychart messages are not routinely monitored during evenings, weekends, and holidays. Please call our office to contact the answering service for urgent concerns during non-business hours.    If you have FMLA/disability paperwork, please drop it off or fax it to [phone], attention Patty.   Appointments/FMLA & disability paperwork: Joycelyn Rua, & Flonnie Hailstone Registered Nurse/Surgery scheduler: Royston Cowper Medical Assistants: Nash Mantis Physician Assistants: Joan Flores, PA-C, Manning Charity, PA-C & Drake Leach, PA-C Surgeons: Venetia Night, MD & Ernestine Mcmurray, MD

## 2023-10-30 ENCOUNTER — Ambulatory Visit: Payer: Medicaid Other

## 2023-10-30 DIAGNOSIS — M542 Cervicalgia: Secondary | ICD-10-CM

## 2023-10-30 DIAGNOSIS — M5416 Radiculopathy, lumbar region: Secondary | ICD-10-CM
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
## 2023-10-31 ENCOUNTER — Encounter: Payer: Self-pay | Admitting: Family Medicine

## 2023-10-31 MED ORDER — PREDNISONE 20 MG PO TABS: ORAL_TABLET | ORAL | 0 refills | Status: DC

## 2023-11-01 ENCOUNTER — Ambulatory Visit: Payer: Medicaid Other

## 2023-11-01 ENCOUNTER — Encounter: Payer: Self-pay | Admitting: Family Medicine

## 2023-11-01 MED ORDER — HYDROCODONE-ACETAMINOPHEN 5-325 MG PO TABS
1.0000 | ORAL_TABLET | Freq: Four times a day (QID) | ORAL | 0 refills | Status: DC | PRN
Start: 2023-11-01 — End: 2023-11-16

## 2023-11-01 MED ORDER — PREDNISONE 20 MG PO TABS: ORAL_TABLET | ORAL | 0 refills | Status: DC

## 2023-11-01 NOTE — Addendum Note (Signed)
Addended by: Hannah Beat on: 11/01/2023 11:11 AM   Modules accepted: Orders

## 2023-11-02 ENCOUNTER — Ambulatory Visit (INDEPENDENT_AMBULATORY_CARE_PROVIDER_SITE_OTHER): Payer: Medicaid Other | Admitting: Primary Care

## 2023-11-02 ENCOUNTER — Encounter
Admission: RE | Admit: 2023-11-02 | Discharge: 2023-11-02 | Disposition: A | Discharge status: Home / Self Care | Payer: Medicaid Other | Source: Ambulatory Visit | Attending: Neurosurgery | Admitting: Neurosurgery

## 2023-11-02 ENCOUNTER — Encounter: Payer: Self-pay | Admitting: Primary Care

## 2023-11-02 VITALS — BP 132/76 | HR 82 | Temp 96.6°F | Ht 66.5 in | Wt 252.0 lb

## 2023-11-02 VITALS — BP 130/80 | HR 76 | Resp 12 | Ht 66.5 in | Wt 251.5 lb

## 2023-11-02 DIAGNOSIS — M5010 Cervical disc disorder with radiculopathy, unspecified cervical region: Secondary | ICD-10-CM | POA: Insufficient documentation

## 2023-11-02 DIAGNOSIS — D7289 Other specified disorders of white blood cells: Secondary | ICD-10-CM | POA: Diagnosis not present

## 2023-11-02 DIAGNOSIS — Z01812 Encounter for preprocedural laboratory examination: Secondary | ICD-10-CM | POA: Diagnosis not present

## 2023-11-02 DIAGNOSIS — Z01818 Encounter for other preprocedural examination: Secondary | ICD-10-CM | POA: Diagnosis not present

## 2023-11-02 DIAGNOSIS — J4530 Mild persistent asthma, uncomplicated: Secondary | ICD-10-CM | POA: Insufficient documentation

## 2023-11-02 DIAGNOSIS — J4520 Mild intermittent asthma, uncomplicated: Secondary | ICD-10-CM

## 2023-11-02 DIAGNOSIS — D72829 Elevated white blood cell count, unspecified: Secondary | ICD-10-CM

## 2023-11-02 DIAGNOSIS — R202 Paresthesia of skin: Secondary | ICD-10-CM | POA: Diagnosis not present

## 2023-11-02 DIAGNOSIS — R7303 Prediabetes: Secondary | ICD-10-CM | POA: Insufficient documentation

## 2023-11-02 HISTORY — DX: Personal history of nicotine dependence: Z87.891

## 2023-11-02 HISTORY — DX: Gastro-esophageal reflux disease without esophagitis: K21.9

## 2023-11-02 HISTORY — DX: Obesity, unspecified: E66.9

## 2023-11-02 HISTORY — DX: Other intervertebral disc degeneration, lumbar region without mention of lumbar back pain or lower extremity pain: M51.369

## 2023-11-02 HISTORY — DX: Cervical disc disorder with radiculopathy, unspecified cervical region: M50.10

## 2023-11-02 HISTORY — DX: Prediabetes: R73.03

## 2023-11-02 HISTORY — DX: Other complications of anesthesia, initial encounter: T88.59XA

## 2023-11-02 HISTORY — DX: Polyp of colon: K63.5

## 2023-11-02 HISTORY — DX: Headache, unspecified: R51.9

## 2023-11-02 HISTORY — DX: Anemia, unspecified: D64.9

## 2023-11-02 HISTORY — DX: Elevated white blood cell count, unspecified: D72.829

## 2023-11-02 HISTORY — DX: Spinal stenosis, cervical region: M48.02

## 2023-11-02 LAB — CBC
HCT: 43.6 % (ref 36.0–46.0)
Hemoglobin: 14.2 g/dL (ref 12.0–15.0)
MCHC: 32.7 g/dL (ref 30.0–36.0)
MCV: 90.8 fL (ref 78.0–100.0)
Platelets: 340 10*3/uL (ref 150.0–400.0)
RBC: 4.8 Mil/uL (ref 3.87–5.11)
RDW: 14.3 % (ref 11.5–15.5)
WBC: 12 10*3/uL — ABNORMAL HIGH (ref 4.0–10.5)

## 2023-11-02 LAB — TYPE AND SCREEN
ABO/RH(D): O POS
Antibody Screen: NEGATIVE

## 2023-11-02 LAB — SURGICAL PCR SCREEN
MRSA, PCR: NEGATIVE
Staphylococcus aureus: NEGATIVE

## 2023-11-02 LAB — COMPREHENSIVE METABOLIC PANEL
ALT: 26 U/L (ref 0–35)
AST: 18 U/L (ref 0–37)
Albumin: 4.5 g/dL (ref 3.5–5.2)
Alkaline Phosphatase: 79 U/L (ref 39–117)
BUN: 23 mg/dL (ref 6–23)
CO2: 24 meq/L (ref 19–32)
Calcium: 9.5 mg/dL (ref 8.4–10.5)
Chloride: 107 meq/L (ref 96–112)
Creatinine, Ser: 0.87 mg/dL (ref 0.40–1.20)
GFR: 76.48 mL/min (ref >60.00)
Glucose, Bld: 103 mg/dL — ABNORMAL HIGH (ref 70–99)
Potassium: 4.7 meq/L (ref 3.5–5.1)
Sodium: 140 meq/L (ref 135–145)
Total Bilirubin: 0.3 mg/dL (ref 0.2–1.2)
Total Protein: 7.1 g/dL (ref 6.0–8.3)

## 2023-11-02 LAB — HEMOGLOBIN A1C: Hgb A1c MFr Bld: 6.1 % (ref 4.6–6.5)

## 2023-11-02 LAB — LIPID PANEL
Cholesterol: 211 mg/dL — ABNORMAL HIGH (ref 0–200)
HDL: 68 mg/dL (ref >39.00)
LDL Cholesterol: 80 mg/dL (ref 0–99)
NonHDL: 143.47
Total CHOL/HDL Ratio: 3
Triglycerides: 317 mg/dL — ABNORMAL HIGH (ref 0.0–149.0)
VLDL: 63.4 mg/dL — ABNORMAL HIGH (ref 0.0–40.0)

## 2023-11-02 MED ORDER — ALBUTEROL SULFATE HFA 108 (90 BASE) MCG/ACT IN AERS: 1.0000 | INHALATION_SPRAY | Freq: Four times a day (QID) | RESPIRATORY_TRACT | 0 refills | Status: DC | PRN

## 2023-11-02 MED ORDER — FLUTICASONE PROPIONATE HFA 110 MCG/ACT IN AERO: 1.0000 | INHALATION_SPRAY | Freq: Two times a day (BID) | RESPIRATORY_TRACT | 3 refills | Status: DC
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
## 2023-11-02 NOTE — Patient Instructions (Signed)
Stop by the lab prior to leaving today. I will notify you of your results once received.   Resume Flovent inhaler for asthma.  Inhale 1 puff into the lungs twice daily.  Rinse your mouth after each use.  Use the albuterol inhaler only if needed as discussed.  It was a pleasure to see you today!

## 2023-11-02 NOTE — Assessment & Plan Note (Signed)
Repeat A1c pending

## 2023-11-02 NOTE — Assessment & Plan Note (Addendum)
Following with neurosurgery, office notes reviewed from January 2025.  Completed preoperative surgical clearance today.  ECG with normal sinus rhythm, rate of 68.  No PAC/PVCs.  No acute ST changes. Appears similar to ECG from 2019.  Await labs.  Should be able to clear patient for surgery once labs return.

## 2023-11-02 NOTE — Patient Instructions (Addendum)
Your procedure is scheduled on:11-15-23 Thursday Report to the Registration Desk on the 1st floor of the [facility].Then proceed to the 2nd floor Surgery Desk To find out your arrival time, please call [phone] between 1PM - 3PM on:11-14-23 Wednesday If your arrival time is 6:00 am, do not arrive before that time as the [facility] entrance doors do not open until 6:00 am.  REMEMBER: Instructions that are not followed completely may result in serious medical risk, up to and including death; or upon the discretion of your surgeon and anesthesiologist your surgery may need to be rescheduled.  Do not eat food after midnight the night before surgery.  No gum chewing or hard candies.  You may however, drink CLEAR liquids up to 2 hours before you are scheduled to arrive for your surgery. Do not drink anything within 2 hours of your scheduled arrival time.  Clear liquids include: - water  - apple juice without pulp - gatorade (not RED colors) - black coffee or tea (Do NOT add milk or creamers to the coffee or tea) Do NOT drink anything that is not on this list..  One week prior to surgery:Starting 11-07-33  Stop ANY OVER THE COUNTER supplements until after surgery (Vitamin B12, Vitamin D, Focus Factor)  Continue taking all of your other prescription medications up until the day of surgery.  ON THE DAY OF SURGERY ONLY TAKE THESE MEDICATIONS WITH SIPS OF WATER: -famotidine (PEPCID)  -propranolol ER (INDERAL LA)   Use your Flovent and Albuterol Inhaler the day of surgery and bring your Albuterol Inhaler to the hospital  No Alcohol for 24 hours before or after surgery.  No Smoking including e-cigarettes for 24 hours before surgery.  No chewable tobacco products for at least 6 hours before surgery.  No nicotine patches on the day of surgery.  Do not use any "recreational" drugs for at least a week (preferably 2 weeks) before your surgery.  Please be advised that the combination of  cocaine and anesthesia may have negative outcomes, up to and including death. If you test positive for cocaine, your surgery will be cancelled.  On the morning of surgery brush your teeth with toothpaste and water, you may rinse your mouth with mouthwash if you wish. Do not swallow any toothpaste or mouthwash.  Use CHG Soap as directed on instruction sheet.  Do not wear jewelry, make-up, hairpins, clips or nail polish.  For welded (permanent) jewelry: bracelets, anklets, waist bands, etc.  Please have this removed prior to surgery.  If it is not removed, there is a chance that hospital personnel will need to cut it off on the day of surgery.  Do not wear lotions, powders, or perfumes.   Do not shave body hair from the neck down 48 hours before surgery.  Contact lenses, hearing aids and dentures may not be worn into surgery.  Do not bring valuables to the hospital. [facility] is not responsible for any missing/lost belongings or valuables.    Notify your doctor if there is any change in your medical condition (cold, fever, infection).  Wear comfortable clothing (specific to your surgery type) to the hospital.  After surgery, you can help prevent lung complications by doing breathing exercises.  Take deep breaths and cough every 1-2 hours. Your doctor may order a device called an Incentive Spirometer to help you take deep breaths. When coughing or sneezing, hold a pillow firmly against your incision with both hands. This is called "splinting." Doing this  helps protect your incision. It also decreases belly discomfort.  If you are being admitted to the hospital overnight, leave your suitcase in the car. After surgery it may be brought to your room.  In case of increased patient census, it may be necessary for you, the patient, to continue your postoperative care in the Same Day Surgery department.  If you are being discharged the day of surgery, you will not be allowed to drive  home. You will need a responsible individual to drive you home and stay with you for 24 hours after surgery.   If you are taking public transportation, you will need to have a responsible individual with you.  Please call the Pre-admissions Testing Dept. at [phone] if you have any questions about these instructions.  Surgery Visitation Policy:  Patients having surgery or a procedure may have two visitors.  Children under the age of 52 must have an adult with them who is not the patient.  Temporary Visitor Restrictions Due to increasing cases of flu, RSV and COVID-19: Children ages 33 and under will not be able to visit patients in [facility] hospitals under most circumstances.  Inpatient Visitation:    Visiting hours are 7 a.m. to 8 p.m. Up to four visitors are allowed at one time in a patient room. The visitors may rotate out with other people during the day.  One visitor age 38 or older may stay with the patient overnight and must be in the room by 8 p.m.    Pre-operative 5 CHG Bath Instructions   You can play a key role in reducing the risk of infection after surgery. Your skin needs to be as free of germs as possible. You can reduce the number of germs on your skin by washing with CHG (chlorhexidine gluconate) soap before surgery. CHG is an antiseptic soap that kills germs and continues to kill germs even after washing.   DO NOT use if you have an allergy to chlorhexidine/CHG or antibacterial soaps. If your skin becomes reddened or irritated, stop using the CHG and notify one of our RNs at [phone].   Please shower with the CHG soap starting 4 days before surgery using the following schedule:     Please keep in mind the following:  DO NOT shave, including legs and underarms, starting the day of your first shower.   You may shave your face at any point before/day of surgery.  Place clean sheets on your bed the day you start using CHG soap. Use a clean washcloth  (not used since being washed) for each shower. DO NOT sleep with pets once you start using the CHG.   CHG Shower Instructions:  If you choose to wash your hair and private area, wash first with your normal shampoo/soap.  After you use shampoo/soap, rinse your hair and body thoroughly to remove shampoo/soap residue.  Turn the water OFF and apply about 3 tablespoons (45 ml) of CHG soap to a CLEAN washcloth.  Apply CHG soap ONLY FROM YOUR NECK DOWN TO YOUR TOES (washing for 3-5 minutes)  DO NOT use CHG soap on face, private areas, open wounds, or sores.  Pay special attention to the area where your surgery is being performed.  If you are having back surgery, having someone wash your back for you may be helpful. Wait 2 minutes after CHG soap is applied, then you may rinse off the CHG soap.  Pat dry with a clean towel  Put on clean clothes/pajamas  If you choose to wear lotion, please use ONLY the CHG-compatible lotions on the back of this paper.     Additional instructions for the day of surgery: DO NOT APPLY any lotions, deodorants, cologne, or perfumes.   Put on clean/comfortable clothes.  Brush your teeth.  Ask your nurse before applying any prescription medications to the skin.      CHG Compatible Lotions   Aveeno Moisturizing lotion  Cetaphil Moisturizing Cream  Cetaphil Moisturizing Lotion  Clairol Herbal Essence Moisturizing Lotion, Dry Skin  Clairol Herbal Essence Moisturizing Lotion, Extra Dry Skin  Clairol Herbal Essence Moisturizing Lotion, Normal Skin  Curel Age Defying Therapeutic Moisturizing Lotion with Alpha Hydroxy  Curel Extreme Care Body Lotion  Curel Soothing Hands Moisturizing Hand Lotion  Curel Therapeutic Moisturizing Cream, Fragrance-Free  Curel Therapeutic Moisturizing Lotion, Fragrance-Free  Curel Therapeutic Moisturizing Lotion, Original Formula  Eucerin Daily Replenishing Lotion  Eucerin Dry Skin Therapy Plus Alpha Hydroxy Crme  Eucerin Dry Skin  Therapy Plus Alpha Hydroxy Lotion  Eucerin Original Crme  Eucerin Original Lotion  Eucerin Plus Crme Eucerin Plus Lotion  Eucerin TriLipid Replenishing Lotion  Keri Anti-Bacterial Hand Lotion  Keri Deep Conditioning Original Lotion Dry Skin Formula Softly Scented  Keri Deep Conditioning Original Lotion, Fragrance Free Sensitive Skin Formula  Keri Lotion Fast Absorbing Fragrance Free Sensitive Skin Formula  Keri Lotion Fast Absorbing Softly Scented Dry Skin Formula  Keri Original Lotion  Keri Skin Renewal Lotion Keri Silky Smooth Lotion  Keri Silky Smooth Sensitive Skin Lotion  Nivea Body Creamy Conditioning Oil  Nivea Body Extra Enriched Teacher, adult education Moisturizing Lotion Nivea Crme  Nivea Skin Firming Lotion  NutraDerm 30 Skin Lotion  NutraDerm Skin Lotion  NutraDerm Therapeutic Skin Cream  NutraDerm Therapeutic Skin Lotion  ProShield Protective Hand Cream  Provon moisturizing lotion

## 2023-11-02 NOTE — Assessment & Plan Note (Signed)
Uncontrolled.   Resume Flovent 110 mcg twice daily every day. Resume albuterol inhaler as needed.  She will update.
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
## 2023-11-06 ENCOUNTER — Ambulatory Visit: Payer: Medicaid Other
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
## 2023-11-12 ENCOUNTER — Ambulatory Visit: Payer: Medicaid Other

## 2023-11-15 ENCOUNTER — Ambulatory Visit
Admission: RE | Admit: 2023-11-15 | Discharge: 2023-11-16 | Disposition: A | Discharge status: Home / Self Care | Payer: Medicaid Other | Attending: Neurosurgery | Admitting: Neurosurgery

## 2023-11-15 ENCOUNTER — Other Ambulatory Visit: Payer: Self-pay

## 2023-11-15 ENCOUNTER — Encounter
Admission: RE | Disposition: A | Discharge status: Home / Self Care | Payer: Self-pay | Source: Home / Self Care | Attending: Neurosurgery

## 2023-11-15 ENCOUNTER — Ambulatory Visit: Payer: Medicaid Other

## 2023-11-15 ENCOUNTER — Ambulatory Visit: Payer: Medicaid Other | Admitting: Urgent Care

## 2023-11-15 ENCOUNTER — Ambulatory Visit: Payer: Medicaid Other | Admitting: Registered Nurse

## 2023-11-15 ENCOUNTER — Encounter: Payer: Self-pay | Admitting: Neurosurgery

## 2023-11-15 DIAGNOSIS — M2578 Osteophyte, vertebrae: Secondary | ICD-10-CM | POA: Insufficient documentation

## 2023-11-15 DIAGNOSIS — Z87891 Personal history of nicotine dependence: Secondary | ICD-10-CM | POA: Diagnosis not present

## 2023-11-15 DIAGNOSIS — J45909 Unspecified asthma, uncomplicated: Secondary | ICD-10-CM | POA: Diagnosis not present

## 2023-11-15 DIAGNOSIS — Z981 Arthrodesis status: Secondary | ICD-10-CM | POA: Diagnosis not present

## 2023-11-15 DIAGNOSIS — K219 Gastro-esophageal reflux disease without esophagitis: Secondary | ICD-10-CM | POA: Insufficient documentation

## 2023-11-15 DIAGNOSIS — Z01818 Encounter for other preprocedural examination: Secondary | ICD-10-CM

## 2023-11-15 DIAGNOSIS — R29898 Other symptoms and signs involving the musculoskeletal system: Secondary | ICD-10-CM | POA: Diagnosis not present

## 2023-11-15 DIAGNOSIS — M79642 Pain in left hand: Secondary | ICD-10-CM | POA: Diagnosis present

## 2023-11-15 DIAGNOSIS — M4802 Spinal stenosis, cervical region: Secondary | ICD-10-CM | POA: Diagnosis not present

## 2023-11-15 DIAGNOSIS — R112 Nausea with vomiting, unspecified: Secondary | ICD-10-CM | POA: Diagnosis not present

## 2023-11-15 DIAGNOSIS — M5412 Radiculopathy, cervical region: Secondary | ICD-10-CM | POA: Diagnosis present

## 2023-11-15 DIAGNOSIS — M5010 Cervical disc disorder with radiculopathy, unspecified cervical region: Secondary | ICD-10-CM | POA: Diagnosis not present

## 2023-11-15 DIAGNOSIS — M47812 Spondylosis without myelopathy or radiculopathy, cervical region: Secondary | ICD-10-CM | POA: Diagnosis present

## 2023-11-15 DIAGNOSIS — M4722 Other spondylosis with radiculopathy, cervical region: Secondary | ICD-10-CM | POA: Diagnosis not present

## 2023-11-15 HISTORY — DX: Other specified postprocedural states: Z98.890

## 2023-11-15 HISTORY — PX: ANTERIOR CERVICAL DECOMP/DISCECTOMY FUSION: SHX1161

## 2023-11-15 HISTORY — DX: Other specified postprocedural states: R11.2

## 2023-11-15 LAB — ABO/RH: ABO/RH(D): O POS

## 2023-11-15 SURGERY — ANTERIOR CERVICAL DECOMPRESSION/DISCECTOMY FUSION 2 LEVELS
Anesthesia: General | Site: Neck

## 2023-11-15 MED ORDER — ACETAMINOPHEN 650 MG RE SUPP: 650.0000 mg | RECTAL | Status: DC | PRN

## 2023-11-15 MED ORDER — DEXAMETHASONE SODIUM PHOSPHATE 10 MG/ML IJ SOLN
INTRAMUSCULAR | Status: DC | PRN
  Administered 2023-11-15: 8 mg via INTRAVENOUS

## 2023-11-15 MED ORDER — CHLORHEXIDINE GLUCONATE 0.12 % MT SOLN
OROMUCOSAL | Status: AC
  Filled 2023-11-15: qty 15

## 2023-11-15 MED ORDER — PHENOL 1.4 % MT LIQD
1.0000 | OROMUCOSAL | Status: DC | PRN
  Filled 2023-11-15: qty 177

## 2023-11-15 MED ORDER — CEFAZOLIN IN SODIUM CHLORIDE 2-0.9 GM/100ML-% IV SOLN
2.0000 g | Freq: Once | INTRAVENOUS | Status: AC
  Administered 2023-11-15: 2 g via INTRAVENOUS
  Filled 2023-11-15: qty 100

## 2023-11-15 MED ORDER — PROPOFOL 10 MG/ML IV BOLUS
INTRAVENOUS | Status: DC | PRN
  Administered 2023-11-15: 150 mg via INTRAVENOUS

## 2023-11-15 MED ORDER — VITAMIN B-12 1000 MCG PO TABS
500.0000 ug | ORAL_TABLET | Freq: Every day | ORAL | Status: DC
  Filled 2023-11-15: qty 1

## 2023-11-15 MED ORDER — PROPRANOLOL HCL ER 80 MG PO CP24
80.0000 mg | ORAL_CAPSULE | ORAL | Status: DC
  Filled 2023-11-15: qty 1

## 2023-11-15 MED ORDER — ALUM & MAG HYDROXIDE-SIMETH 200-200-20 MG/5ML PO SUSP: 30.0000 mL | Freq: Four times a day (QID) | ORAL | Status: DC | PRN

## 2023-11-15 MED ORDER — ONDANSETRON HCL 4 MG/2ML IJ SOLN
INTRAMUSCULAR | Status: DC | PRN
  Administered 2023-11-15: 4 mg via INTRAVENOUS

## 2023-11-15 MED ORDER — CHLORHEXIDINE GLUCONATE 0.12 % MT SOLN
15.0000 mL | Freq: Once | OROMUCOSAL | Status: AC
  Administered 2023-11-15: 15 mL via OROMUCOSAL

## 2023-11-15 MED ORDER — ACETAMINOPHEN 500 MG PO TABS
1000.0000 mg | ORAL_TABLET | Freq: Four times a day (QID) | ORAL | Status: AC
  Administered 2023-11-15 – 2023-11-16 (×3): 1000 mg via ORAL
  Filled 2023-11-15 (×2): qty 2

## 2023-11-15 MED ORDER — ACETAMINOPHEN 325 MG PO TABS: 650.0000 mg | ORAL_TABLET | ORAL | Status: DC | PRN

## 2023-11-15 MED ORDER — HYDROMORPHONE HCL 1 MG/ML IJ SOLN
INTRAMUSCULAR | Status: AC
  Filled 2023-11-15: qty 1

## 2023-11-15 MED ORDER — ROCURONIUM BROMIDE 10 MG/ML (PF) SYRINGE
PREFILLED_SYRINGE | INTRAVENOUS | Status: AC
  Filled 2023-11-15: qty 10

## 2023-11-15 MED ORDER — FENTANYL CITRATE (PF) 100 MCG/2ML IJ SOLN
INTRAMUSCULAR | Status: DC | PRN
  Administered 2023-11-15: 100 ug via INTRAVENOUS

## 2023-11-15 MED ORDER — FENTANYL CITRATE (PF) 100 MCG/2ML IJ SOLN
25.0000 ug | INTRAMUSCULAR | Status: DC | PRN
Start: 2023-11-15 — End: 2023-11-15
  Administered 2023-11-15: 25 ug via INTRAVENOUS

## 2023-11-15 MED ORDER — CEFAZOLIN SODIUM-DEXTROSE 2-4 GM/100ML-% IV SOLN: 2.0000 g | Freq: Four times a day (QID) | INTRAVENOUS | Status: DC

## 2023-11-15 MED ORDER — HYDROMORPHONE HCL 1 MG/ML IJ SOLN
INTRAMUSCULAR | Status: DC | PRN
  Administered 2023-11-15 (×2): .5 mg via INTRAVENOUS

## 2023-11-15 MED ORDER — BISACODYL 5 MG PO TBEC: 5.0000 mg | DELAYED_RELEASE_TABLET | Freq: Every day | ORAL | Status: DC | PRN

## 2023-11-15 MED ORDER — MIDAZOLAM HCL 2 MG/2ML IJ SOLN
INTRAMUSCULAR | Status: DC | PRN
  Administered 2023-11-15: 2 mg via INTRAVENOUS

## 2023-11-15 MED ORDER — DROPERIDOL 2.5 MG/ML IJ SOLN
0.6250 mg | Freq: Once | INTRAMUSCULAR | Status: AC | PRN
  Administered 2023-11-15: 0.625 mg via INTRAVENOUS

## 2023-11-15 MED ORDER — FAMOTIDINE 20 MG PO TABS
20.0000 mg | ORAL_TABLET | ORAL | Status: DC
  Administered 2023-11-16: 20 mg via ORAL

## 2023-11-15 MED ORDER — MENTHOL 3 MG MT LOZG
1.0000 | LOZENGE | OROMUCOSAL | Status: DC | PRN
  Filled 2023-11-15: qty 9

## 2023-11-15 MED ORDER — PHENYLEPHRINE 80 MCG/ML (10ML) SYRINGE FOR IV PUSH (FOR BLOOD PRESSURE SUPPORT)
PREFILLED_SYRINGE | INTRAVENOUS | Status: DC | PRN
  Administered 2023-11-15 (×2): 80 ug via INTRAVENOUS

## 2023-11-15 MED ORDER — OXYCODONE HCL 5 MG PO TABS
ORAL_TABLET | ORAL | Status: AC
  Filled 2023-11-15: qty 2

## 2023-11-15 MED ORDER — EPHEDRINE SULFATE-NACL 50-0.9 MG/10ML-% IV SOSY
PREFILLED_SYRINGE | INTRAVENOUS | Status: DC | PRN
  Administered 2023-11-15: 5 mg via INTRAVENOUS

## 2023-11-15 MED ORDER — DOCUSATE SODIUM 100 MG PO CAPS
100.0000 mg | ORAL_CAPSULE | Freq: Two times a day (BID) | ORAL | Status: DC
  Administered 2023-11-15: 100 mg via ORAL
  Filled 2023-11-15: qty 1

## 2023-11-15 MED ORDER — OXYCODONE HCL 5 MG PO TABS
10.0000 mg | ORAL_TABLET | ORAL | Status: DC | PRN
  Administered 2023-11-15 – 2023-11-16 (×3): 10 mg via ORAL
  Filled 2023-11-15 (×2): qty 2

## 2023-11-15 MED ORDER — CALCIUM CARBONATE 1250 (500 CA) MG PO TABS
1.0000 | ORAL_TABLET | Freq: Two times a day (BID) | ORAL | Status: DC
  Filled 2023-11-15 (×2): qty 1

## 2023-11-15 MED ORDER — ACETAMINOPHEN 10 MG/ML IV SOLN
INTRAVENOUS | Status: DC | PRN
  Administered 2023-11-15: 1000 mg via INTRAVENOUS

## 2023-11-15 MED ORDER — DROPERIDOL 2.5 MG/ML IJ SOLN
INTRAMUSCULAR | Status: AC
  Filled 2023-11-15: qty 2

## 2023-11-15 MED ORDER — SODIUM CHLORIDE 0.9% FLUSH: 3.0000 mL | INTRAVENOUS | Status: DC | PRN

## 2023-11-15 MED ORDER — 0.9 % SODIUM CHLORIDE (POUR BTL) OPTIME
TOPICAL | Status: DC | PRN
  Administered 2023-11-15: 500 mL

## 2023-11-15 MED ORDER — BUDESONIDE 0.25 MG/2ML IN SUSP
0.2500 mg | Freq: Two times a day (BID) | RESPIRATORY_TRACT | Status: DC
  Administered 2023-11-15 – 2023-11-16 (×2): 0.25 mg via RESPIRATORY_TRACT
  Filled 2023-11-15 (×3): qty 2

## 2023-11-15 MED ORDER — LACTATED RINGERS IV SOLN: INTRAVENOUS | Status: DC

## 2023-11-15 MED ORDER — SODIUM CHLORIDE 0.9% FLUSH
3.0000 mL | Freq: Two times a day (BID) | INTRAVENOUS | Status: DC
  Administered 2023-11-15 – 2023-11-16 (×2): 3 mL via INTRAVENOUS

## 2023-11-15 MED ORDER — DEXAMETHASONE SODIUM PHOSPHATE 10 MG/ML IJ SOLN
INTRAMUSCULAR | Status: AC
Start: 2023-11-15 — End: ?
  Filled 2023-11-15: qty 1

## 2023-11-15 MED ORDER — METHOCARBAMOL 1000 MG/10ML IJ SOLN
INTRAMUSCULAR | Status: AC
  Filled 2023-11-15: qty 10

## 2023-11-15 MED ORDER — HYDROMORPHONE HCL 1 MG/ML IJ SOLN
1.0000 mg | INTRAMUSCULAR | Status: DC | PRN
  Administered 2023-11-15 – 2023-11-16 (×2): 1 mg via INTRAVENOUS
  Filled 2023-11-15: qty 1

## 2023-11-15 MED ORDER — ENOXAPARIN SODIUM 40 MG/0.4ML IJ SOSY
40.0000 mg | PREFILLED_SYRINGE | INTRAMUSCULAR | Status: DC
  Administered 2023-11-16: 40 mg via SUBCUTANEOUS
  Filled 2023-11-15: qty 0.4

## 2023-11-15 MED ORDER — MIDAZOLAM HCL 2 MG/2ML IJ SOLN
INTRAMUSCULAR | Status: AC
  Filled 2023-11-15: qty 2

## 2023-11-15 MED ORDER — ALBUTEROL SULFATE (2.5 MG/3ML) 0.083% IN NEBU: 3.0000 mL | INHALATION_SOLUTION | Freq: Four times a day (QID) | RESPIRATORY_TRACT | Status: DC | PRN

## 2023-11-15 MED ORDER — PROPOFOL 10 MG/ML IV BOLUS
INTRAVENOUS | Status: AC
  Filled 2023-11-15: qty 20

## 2023-11-15 MED ORDER — ONDANSETRON HCL 4 MG/2ML IJ SOLN
INTRAMUSCULAR | Status: AC
  Filled 2023-11-15: qty 2

## 2023-11-15 MED ORDER — VITAMIN D 25 MCG (1000 UNIT) PO TABS: 1000.0000 [IU] | ORAL_TABLET | Freq: Every day | ORAL | Status: DC

## 2023-11-15 MED ORDER — SUGAMMADEX SODIUM 200 MG/2ML IV SOLN
INTRAVENOUS | Status: DC | PRN
  Administered 2023-11-15: 200 mg via INTRAVENOUS

## 2023-11-15 MED ORDER — FENTANYL CITRATE (PF) 100 MCG/2ML IJ SOLN
INTRAMUSCULAR | Status: AC
  Filled 2023-11-15: qty 2

## 2023-11-15 MED ORDER — HYDRALAZINE HCL 20 MG/ML IJ SOLN
INTRAMUSCULAR | Status: DC | PRN
  Administered 2023-11-15: 5 mg via INTRAVENOUS

## 2023-11-15 MED ORDER — FENTANYL CITRATE (PF) 100 MCG/2ML IJ SOLN
25.0000 ug | INTRAMUSCULAR | Status: DC | PRN
  Administered 2023-11-15: 25 ug via INTRAVENOUS
  Administered 2023-11-15: 50 ug via INTRAVENOUS

## 2023-11-15 MED ORDER — ACETAMINOPHEN 500 MG PO TABS: 1000.0000 mg | ORAL_TABLET | Freq: Four times a day (QID) | ORAL | Status: DC

## 2023-11-15 MED ORDER — SURGIFLO WITH THROMBIN (HEMOSTATIC MATRIX KIT) OPTIME
TOPICAL | Status: DC | PRN
  Administered 2023-11-15: 1 via TOPICAL

## 2023-11-15 MED ORDER — ONDANSETRON HCL 4 MG/2ML IJ SOLN
4.0000 mg | Freq: Four times a day (QID) | INTRAMUSCULAR | Status: DC | PRN
  Administered 2023-11-16: 4 mg via INTRAVENOUS
  Filled 2023-11-15: qty 2

## 2023-11-15 MED ORDER — ACETAMINOPHEN 10 MG/ML IV SOLN
INTRAVENOUS | Status: AC
  Filled 2023-11-15: qty 100

## 2023-11-15 MED ORDER — CEFAZOLIN SODIUM-DEXTROSE 2-4 GM/100ML-% IV SOLN
2.0000 g | Freq: Four times a day (QID) | INTRAVENOUS | Status: AC
  Administered 2023-11-15: 2 g via INTRAVENOUS

## 2023-11-15 MED ORDER — LIDOCAINE HCL (CARDIAC) PF 100 MG/5ML IV SOSY
PREFILLED_SYRINGE | INTRAVENOUS | Status: DC | PRN
  Administered 2023-11-15: 100 mg via INTRAVENOUS

## 2023-11-15 MED ORDER — ONDANSETRON HCL 4 MG PO TABS: 4.0000 mg | ORAL_TABLET | Freq: Four times a day (QID) | ORAL | Status: DC | PRN

## 2023-11-15 MED ORDER — DOCUSATE SODIUM 100 MG PO CAPS
ORAL_CAPSULE | ORAL | Status: AC
  Filled 2023-11-15: qty 1

## 2023-11-15 MED ORDER — SODIUM CHLORIDE 0.9 % IV SOLN
250.0000 mL | INTRAVENOUS | Status: DC
  Administered 2023-11-15: 250 mL via INTRAVENOUS

## 2023-11-15 MED ORDER — SENNA 8.6 MG PO TABS
1.0000 | ORAL_TABLET | Freq: Two times a day (BID) | ORAL | Status: DC
  Administered 2023-11-15: 8.6 mg via ORAL
  Filled 2023-11-15 (×2): qty 1

## 2023-11-15 MED ORDER — BUPIVACAINE-EPINEPHRINE (PF) 0.5% -1:200000 IJ SOLN
INTRAMUSCULAR | Status: DC | PRN
  Administered 2023-11-15: 7 mL

## 2023-11-15 MED ORDER — OXYCODONE HCL 5 MG/5ML PO SOLN: 5.0000 mg | Freq: Once | ORAL | Status: DC | PRN

## 2023-11-15 MED ORDER — CEFAZOLIN SODIUM-DEXTROSE 2-4 GM/100ML-% IV SOLN
INTRAVENOUS | Status: AC
  Filled 2023-11-15: qty 100

## 2023-11-15 MED ORDER — PANTOPRAZOLE SODIUM 40 MG IV SOLR
40.0000 mg | Freq: Every day | INTRAVENOUS | Status: DC
  Administered 2023-11-15: 40 mg via INTRAVENOUS
  Filled 2023-11-15: qty 10

## 2023-11-15 MED ORDER — OXYCODONE HCL 5 MG PO TABS: 5.0000 mg | ORAL_TABLET | ORAL | Status: DC | PRN

## 2023-11-15 MED ORDER — METHOCARBAMOL 1000 MG/10ML IJ SOLN
500.0000 mg | Freq: Four times a day (QID) | INTRAMUSCULAR | Status: DC | PRN
  Administered 2023-11-15: 500 mg via INTRAVENOUS

## 2023-11-15 MED ORDER — OXYCODONE HCL 5 MG PO TABS: 5.0000 mg | ORAL_TABLET | Freq: Once | ORAL | Status: DC | PRN

## 2023-11-15 MED ORDER — ACETAMINOPHEN 10 MG/ML IV SOLN: 1000.0000 mg | Freq: Once | INTRAVENOUS | Status: DC | PRN

## 2023-11-15 MED ORDER — ORAL CARE MOUTH RINSE: 15.0000 mL | Freq: Once | OROMUCOSAL | Status: AC

## 2023-11-15 MED ORDER — METHOCARBAMOL 500 MG PO TABS: 500.0000 mg | ORAL_TABLET | Freq: Four times a day (QID) | ORAL | Status: DC | PRN

## 2023-11-15 MED ORDER — BUPIVACAINE HCL (PF) 0.5 % IJ SOLN
INTRAMUSCULAR | Status: AC
  Filled 2023-11-15: qty 90

## 2023-11-15 MED ORDER — ROCURONIUM BROMIDE 100 MG/10ML IV SOLN
INTRAVENOUS | Status: DC | PRN
  Administered 2023-11-15: 50 mg via INTRAVENOUS

## 2023-11-15 MED ORDER — MAGNESIUM CITRATE PO SOLN
1.0000 | Freq: Once | ORAL | Status: DC | PRN
  Filled 2023-11-15: qty 296

## 2023-11-15 SURGICAL SUPPLY — 40 items
BASIN KIT SINGLE STR (MISCELLANEOUS) ×1 IMPLANT
BIT DRILL ACP 13 (DRILL) IMPLANT
BRUSH SCRUB EZ 4% CHG (MISCELLANEOUS) ×1 IMPLANT
BUR NEURO DRILL SOFT 3.0X3.8M (BURR) ×1 IMPLANT
DERMABOND ADVANCED .7 DNX12 (GAUZE/BANDAGES/DRESSINGS) ×1 IMPLANT
DRAPE C-ARM XRAY 36X54 (DRAPES) ×2 IMPLANT
DRAPE LAPAROTOMY 77X122 PED (DRAPES) ×1 IMPLANT
DRAPE MICROSCOPE SPINE 48X150 (DRAPES) ×1 IMPLANT
DRILL ACP 13 (DRILL) ×1
DRSG TEGADERM 2-3/8X2-3/4 SM (GAUZE/BANDAGES/DRESSINGS) ×1 IMPLANT
DRSG TELFA 3X4 N-ADH STERILE (GAUZE/BANDAGES/DRESSINGS) ×1 IMPLANT
ELECT REM PT RETURN 9FT ADLT (ELECTROSURGICAL) ×1
ELECTRODE REM PT RTRN 9FT ADLT (ELECTROSURGICAL) ×1 IMPLANT
FEE INTRAOP CADWELL SUPPLY NCS (MISCELLANEOUS) IMPLANT
FEE INTRAOP MONITOR IMPULS NCS (MISCELLANEOUS) IMPLANT
GLOVE BIOGEL PI IND STRL 8 (GLOVE) ×1 IMPLANT
GLOVE SRG 8 PF TXTR STRL LF DI (GLOVE) ×1 IMPLANT
GLOVE SURG SYN 7.5 E (GLOVE) ×1
GLOVE SURG SYN 7.5 PF PI (GLOVE) ×1 IMPLANT
GOWN SRG LRG LVL 4 IMPRV REINF (GOWNS) ×1 IMPLANT
GOWN STRL REUS W/ TWL XL LVL3 (GOWN DISPOSABLE) ×1 IMPLANT
INTRAOP CADWELL SUPPLY FEE NCS (MISCELLANEOUS) ×1
INTRAOP MONITOR FEE IMPULS NCS (MISCELLANEOUS) ×1
KIT TURNOVER KIT A (KITS) ×1 IMPLANT
MANIFOLD NEPTUNE II (INSTRUMENTS) ×1 IMPLANT
NS IRRIG 500ML POUR BTL (IV SOLUTION) ×1 IMPLANT
PACK LAMINECTOMY ARMC (PACKS) ×1 IMPLANT
PAD ARMBOARD 7.5X6 YLW CONV (MISCELLANEOUS) ×2 IMPLANT
PIN ACP TEMP FIXATION (EXFIX) IMPLANT
PIN CASPAR 14 (PIN) ×1 IMPLANT
PIN CASPAR 14MM (PIN) ×1
PLATE ACP 1.6X32 2LVL (Plate) IMPLANT
SCREW ACP VA ST 3.5X15 (Screw) IMPLANT
SPACER CERVICAL FRGE 12X14X5 (Spacer) IMPLANT
SPONGE KITTNER 5P (MISCELLANEOUS) ×1 IMPLANT
SURGIFLO W/THROMBIN 8M KIT (HEMOSTASIS) ×1 IMPLANT
SUT VIC AB 3-0 SH 8-18 (SUTURE) ×1 IMPLANT
SYR 20ML LL LF (SYRINGE) ×1 IMPLANT
TAPE CLOTH 3X10 WHT NS LF (GAUZE/BANDAGES/DRESSINGS) ×3 IMPLANT
TRAP FLUID SMOKE EVACUATOR (MISCELLANEOUS) ×1 IMPLANT

## 2023-11-15 NOTE — Progress Notes (Signed)
 PHARMACIST - PHYSICIAN COMMUNICATION  CONCERNING:  Enoxaparin  (Lovenox ) for DVT Prophylaxis   RECOMMENDATION: Patient was prescribed enoxaprin 40mg  q24 hours for VTE prophylaxis.   Filed Weights   11/15/23 1630  Weight: 114.1 kg (251 lb 8.7 oz)   Body mass index is 40 kg/m.  Based on [facility] policy patient is candidate for enoxaparin  0.5mg /kg TBW SQ every 24 hours based on BMI being >30.  DESCRIPTION: Pharmacy has adjusted enoxaparin  dose per [facility] policy.  Patient is now receiving enoxaparin  60 mg every 24 hours.    Da Authement, PharmD Pharmacy Resident  11/15/2023 4:44 PM

## 2023-11-15 NOTE — Discharge Instructions (Signed)
 Your surgeon has performed an operation on your cervical spine (neck) to relieve pressure on the spinal cord and/or nerves. This involved making an incision in the front of your neck and removing one or more of the discs that support your spine. Next, a small piece of bone, a titanium plate, and screws were used to fuse two or more of the vertebrae (bones) together.  The following are instructions to help in your recovery once you have been discharged from the hospital. Even if you feel well, it is important that you follow these activity guidelines. If you do not let your neck heal properly from the surgery, you can increase the chance of return of your symptoms and other complications.  * Do not take anti-inflammatory medications for 3 months after surgery (naproxen [Aleve], ibuprofen [Advil, Motrin], etc.). These medications can prevent your bones from healing properly.  Celebrex, if prescribed, is ok to take.  Activity    No bending, lifting, or twisting ("BLT"). Avoid lifting objects heavier than 10 pounds (gallon milk jug).  Where possible, avoid household activities that involve lifting, bending, reaching, pushing, or pulling such as laundry, vacuuming, grocery shopping, and childcare. Try to arrange for help from friends and family for these activities while your back heals.  Increase physical activity slowly as tolerated.  Taking short walks is encouraged, but avoid strenuous exercise. Do not jog, run, bicycle, lift weights, or participate in any other exercises unless specifically allowed by your doctor.  Talk to your doctor before resuming sexual activity.  You should not drive until cleared by your doctor.  Until released by your doctor, you should not return to work or school.  You should rest at home and let your body heal.   You may shower three days after your surgery.  After showering, lightly dab your incision dry. Do not take a tub bath or go swimming until approved by your  doctor at your follow-up appointment.  If your doctor ordered a cervical collar (neck brace) for you, you should wear it whenever you are out of bed. You may remove it when lying down or sleeping, but you should wear it at all other times. Not all neck surgeries require a cervical collar.  If you smoke, we strongly recommend that you quit.  Smoking has been proven to interfere with normal bone healing and will dramatically reduce the success rate of your surgery. Please contact [facility] (800-QUIT-NOW) and use the resources at www.[facility].com for assistance in stopping smoking.  Surgical Incision   If you have a dressing on your incision, you may remove it two days after your surgery. Keep your incision area clean and dry.  If you have staples or stitches on your incision, you should have a follow up scheduled for removal. If you do not have staples or stitches, you will have steri-strips (small pieces of surgical tape) or Dermabond glue. The steri-strips/glue should begin to peel away within about a week (it is fine if the steri-strips fall off before then). If the strips are still in place one week after your surgery, you may gently remove them.  Diet           You may return to your usual diet. However, you may experience discomfort when swallowing in the first month after your surgery. This is normal. You may find that softer foods are more comfortable for you to swallow. Be sure to stay hydrated.  When to Contact us  You may experience pain in your  neck and/or pain between your shoulder blades. This is normal and should improve in the next few weeks with the help of pain medication, muscle relaxers, and rest. Some patients report that a warm compress on the back of the neck or between the shoulder blades helps.  However, should you experience any of the following, contact us immediately: New numbness or weakness Pain that is progressively getting worse, and is not relieved by your pain  medication, muscle relaxers, rest, and warm compresses Bleeding, redness, swelling, pain, or drainage from surgical incision Chills or flu-like symptoms Fever greater than 101.0 F (38.3 C) Inability to eat, drink fluids, or take medications Problems with bowel or bladder functions Difficulty breathing or shortness of breath Warmth, tenderness, or swelling in your calf Contact Information How to contact us:  If you have any questions/concerns before or after surgery, you can reach [place] at [phone], or you can send a mychart message. We can be reached by phone or mychart 8am-4pm, Monday-Friday.  *Please note: Calls after 4pm are forwarded to a third party answering service. Mychart messages are not routinely monitored during evenings, weekends, and holidays. Please call our office to contact the answering service for urgent concerns during non-business hours.

## 2023-11-15 NOTE — Interval H&P Note (Signed)
 History and Physical Interval Note:  11/15/2023 12:53 PM  Beverly Wright  has presented today for surgery, with the diagnosis of M50.10 Cervical disc disorder with radiculopathy of cervical region R29.898 Right arm weakness M47.812 Cervical spondylosis without myelopathy.  The various methods of treatment have been discussed with the patient and family. After consideration of risks, benefits and other options for treatment, the patient has consented to  Procedure(s): C4-6 ANTERIOR CERVICAL DISCECTOMY AND FUSION (FORGE) (N/A) as a surgical intervention.  The patient's history has been reviewed, patient examined, no change in status, stable for surgery.  I have reviewed the patient's chart and labs.  Questions were answered to the patient's satisfaction.    Heart and lungs clear  Penne LELON Sharps

## 2023-11-15 NOTE — Anesthesia Procedure Notes (Signed)
 Procedure Name: Intubation Date/Time: 11/15/2023 1:52 PM  Performed by: Elly Pfeiffer, CRNAPre-anesthesia Checklist: Emergency Drugs available, Suction available, Patient being monitored, Timeout performed and Patient identified Patient Re-evaluated:Patient Re-evaluated prior to induction Oxygen Delivery Method: Circle system utilized Preoxygenation: Pre-oxygenation with 100% oxygen Induction Type: IV induction Ventilation: Mask ventilation without difficulty Laryngoscope Size: McGrath and 3 Grade View: Grade I Tube type: Oral Tube size: 6.5 mm Number of attempts: 1 Airway Equipment and Method: Stylet and Video-laryngoscopy Placement Confirmation: ETT inserted through vocal cords under direct vision, positive ETCO2 and breath sounds checked- equal and bilateral Secured at: 21 cm Tube secured with: Tape Dental Injury: Teeth and Oropharynx as per pre-operative assessment

## 2023-11-15 NOTE — Op Note (Signed)
 Indications: 53 year old woman with a history of a progressive cervical radiculopathy with right arm weakness she has been followed in our clinic for significant amount of time and continue to have progressive worsening of her symptoms.  Given the weakness on examination and failure with conservative care she was taken to the OR for a C4-C6 anterior cervical discectomy and fusion  Findings: Right worse than left foraminal stenosis well decompressed at the end of the procedure  Preoperative Diagnosis: Cervical radiculopathy, cervical spondylosis, foraminal stenosis, loss of lordosis Postoperative Diagnosis: same   EBL: 100 ml IVF: See anesthesia report Drains: None  Disposition: Extubated and Stable to PACU Complications: none  No foley catheter was placed.   Preoperative Note:   Risks of surgery discussed include: infection, bleeding, stroke, coma, death, paralysis, CSF leak, nerve/spinal cord injury, numbness, tingling, weakness, complex regional pain syndrome, recurrent stenosis and/or disc herniation, vascular injury, development of instability, neck/back pain, need for further surgery, persistent symptoms, development of deformity, C5 palsy and the risks of anesthesia. The patient understood these risks and agreed to proceed.  Procedure:  1) Anterior cervical diskectomy and fusion at C4-6 2) Anterior cervical instrumentation at C4-6 3) Structural allograft consisting of corticocancellous allograft   Procedure: After obtaining informed consent, the patient taken to the operating room, placed in supine position, general anesthesia induced.  The patient had a small shoulder roll placed behind their shoulders.  The patient received preop antibiotics and IV Decadron .  The patient had a neck incision outlined, was prepped and draped in usual sterile fashion. The incision was injected with local anesthetic.   An incision was opened, dissection taken down medial to the carotid artery and  jugular vein, lateral to the trachea and esophagus.  The prevertebral fascia identified and a localizing x-ray demonstrated the correct level.  The longus colli were dissected laterally, and self-retaining retractors placed to open the operative field. The microscope was then brought into the field.  With this complete, distractor pins were placed in the vertebral bodies of C5 and C6. The distractor was placed, and the annulus at C5/5 was opened using a bovie.  Curettes and pituitary rongeurs used to remove the majority of disk, then the drill was used to remove the posterior osteophyte and begin the foraminotomies. The nerve hook was used to elevate the posterior longitudinal ligament, which was then removed with Kerrison rongeurs. The microblunt nerve hook could be passed out the foramen bilaterally.   Meticulous hemostasis obtained.  Structural allograft was tapped behind the anterior lip of the vertebral body at C5/6 (5 mm).    The caspar distractor was removed, and bone wax used for hemostasis.  At this time we turned our attention to the C4-5 level.  We placed a Caspar pin in the C4 body.  We then open the annulus of C4-5 with a Bovie cautery.  We then attempted some slight distraction of the Caspar pins but we did not get a significant amount of motion.  Therefore we had to drill the anterior osteophyte into we are able to get into the disc space.  We then used a Kerrison rongeur to release the uncovertebral joints bilaterally.  Once we are able to do this we were able to get a clean distraction.  We remove the rest of the disc in the usual fashion.  We came to the posterior longitudinal ligament.  Upgoing curette was used to get to the level deep to the posterior longitudinal ligament.  Kerrison rongeur was used to  resect the rest of the disc osteophyte complex.  On the right side especially there is some significant stenosis at the foramen level.  This was well decompressed at the end of the procedure.   A micro blunt nerve hook was able to be passed out the bilateral foramina without difficulty.  A separate, 32 mm plate was chosen.  Two screws placed in each vertebral body, respectively making sure the screws were behind the locking mechanism.  Final AP and lateral radiographs were taken.   With everything in good position, the wound was irrigated copiously and meticulous hemostasis obtained.  Wound was closed in 2 layers using interrupted inverted 3-0 Vicryl sutures.  The wound was dressed with dermabond, the head of bed at 30 degrees, taken to recovery room in stable condition.  No new postop neurological deficits were identified.  Sponge and pattie counts were correct at the end of the procedure.    I performed the procedure with the assistance of [employer], they aided in the exposure, retraction of critical structures, visualization of critical structures, stabilization during instrumentation, and closure.  Implants: Implant Name Type Inv. Item Serial No. Manufacturer Lot No. LRB No. Used Action  SPACER CERVICAL FRGE 12X14X5 - [id number] Spacer SPACER CERVICAL FRGE [id number] [facility] MEDICAL  N/A 1 Implanted  SPACER CERVICAL FRGE 12X14X5 - [id number] Spacer SPACER CERVICAL FRGE 12X14X5 [id number] [employer]  N/A 1 Implanted  SCREW ACP VA ST 3.5X15 - [id number] Screw SCREW ACP VA ST 3.5X15  [employer]  N/A 6 Implanted  PLATE ACP 8.3K67 2LVL - [id number] Plate PLATE ACP 8.3K67 2LVL  [employer]  N/A 1 Implanted   Penne MICAEL Sharps, MD/MSCR

## 2023-11-15 NOTE — Progress Notes (Signed)
 Spoke with neurosurgery:  Dr. Mont Antis , tongue numbness not concerning unless a laceration: none noted. Will continue to monitor.
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
## 2023-11-15 NOTE — Progress Notes (Signed)
 Patient awake/alert x4.  New complaint of tongue being "numb" O2 sats on 2 liters 95-96% no resp distress noted, no s/s hematoma anterior neck area/incisional area. Will alert neurosurgery.

## 2023-11-15 NOTE — Progress Notes (Signed)
 Patient awake/alert x4.  Moving all ext. Noted upper and lower L>R. Able to bend bil knee's, push without pain. Medicated as ordered.  Dinner tray provided, all soft foods, no issues with swallowing noted.

## 2023-11-15 NOTE — Transfer of Care (Signed)
 Immediate Anesthesia Transfer of Care Note  Patient: Beverly Wright  Procedure(s) Performed: C4-6 ANTERIOR CERVICAL DISCECTOMY AND FUSION (FORGE) (Neck)  Patient Location: PACU  Anesthesia Type:General  Level of Consciousness: drowsy and responds to stimulation  Airway & Oxygen Therapy: Patient Spontanous Breathing and Patient connected to face mask oxygen  Post-op Assessment: Report given to RN and Post -op Vital signs reviewed and stable  Post vital signs: Reviewed and stable  Last Vitals:  Vitals Value Taken Time  BP 110/61 11/15/23 1625  Temp 36.2 C 11/15/23 1625  Pulse 76 11/15/23 1627  Resp 12 11/15/23 1627  SpO2 98 % 11/15/23 1627  Vitals shown include unfiled device data.  Last Pain:  Vitals:   11/15/23 1057  TempSrc: Temporal  PainSc: 7          Complications: No notable events documented.

## 2023-11-15 NOTE — Anesthesia Postprocedure Evaluation (Signed)
 Anesthesia Post Note  Patient: Naiyah Klostermann  Procedure(s) Performed: C4-6 ANTERIOR CERVICAL DISCECTOMY AND FUSION (FORGE) (Neck)  Patient location during evaluation: PACU Anesthesia Type: General Level of consciousness: awake and alert Pain management: pain level controlled Vital Signs Assessment: post-procedure vital signs reviewed and stable Respiratory status: spontaneous breathing, nonlabored ventilation, respiratory function stable and patient connected to nasal cannula oxygen Cardiovascular status: blood pressure returned to baseline and stable Postop Assessment: no apparent nausea or vomiting Anesthetic complications: no   No notable events documented.   Last Vitals:  Vitals:   11/15/23 1844 11/15/23 2215  BP:    Pulse: (!) 58   Resp: 10   Temp:    SpO2: 95% 95%    Last Pain:  Vitals:   11/15/23 1914  TempSrc:   PainSc: 6                  Fairy POUR Deairra Halleck

## 2023-11-16 ENCOUNTER — Ambulatory Visit: Payer: Medicaid Other

## 2023-11-16 ENCOUNTER — Encounter: Payer: Self-pay | Admitting: Neurosurgery

## 2023-11-16 ENCOUNTER — Telehealth: Payer: Self-pay

## 2023-11-16 DIAGNOSIS — Z981 Arthrodesis status: Secondary | ICD-10-CM | POA: Diagnosis not present

## 2023-11-16 DIAGNOSIS — Z4789 Encounter for other orthopedic aftercare: Secondary | ICD-10-CM | POA: Diagnosis not present

## 2023-11-16 DIAGNOSIS — M799 Soft tissue disorder, unspecified: Secondary | ICD-10-CM | POA: Diagnosis not present

## 2023-11-16 DIAGNOSIS — M4722 Other spondylosis with radiculopathy, cervical region: Secondary | ICD-10-CM | POA: Diagnosis not present

## 2023-11-16 MED ORDER — ACETAMINOPHEN 500 MG PO TABS
ORAL_TABLET | ORAL | Status: AC
  Filled 2023-11-16: qty 2

## 2023-11-16 MED ORDER — SODIUM CHLORIDE 0.9 % IV SOLN
12.5000 mg | Freq: Four times a day (QID) | INTRAVENOUS | Status: DC | PRN
  Administered 2023-11-16: 12.5 mg via INTRAVENOUS
  Filled 2023-11-16: qty 12.5

## 2023-11-16 MED ORDER — ONDANSETRON 4 MG PO TBDP: 4.0000 mg | ORAL_TABLET | Freq: Three times a day (TID) | ORAL | 0 refills | Status: DC | PRN

## 2023-11-16 MED ORDER — DEXAMETHASONE SODIUM PHOSPHATE 10 MG/ML IJ SOLN
INTRAMUSCULAR | Status: AC
  Filled 2023-11-16: qty 1

## 2023-11-16 MED ORDER — OXYCODONE HCL 5 MG PO TABS: 5.0000 mg | ORAL_TABLET | ORAL | 0 refills | Status: DC | PRN

## 2023-11-16 MED ORDER — METHOCARBAMOL 500 MG PO TABS: 500.0000 mg | ORAL_TABLET | Freq: Four times a day (QID) | ORAL | Status: DC

## 2023-11-16 MED ORDER — FAMOTIDINE 20 MG PO TABS
ORAL_TABLET | ORAL | Status: AC
  Filled 2023-11-16: qty 1

## 2023-11-16 MED ORDER — METHOCARBAMOL 500 MG PO TABS: 500.0000 mg | ORAL_TABLET | Freq: Four times a day (QID) | ORAL | 0 refills | Status: DC

## 2023-11-16 MED ORDER — DEXAMETHASONE SODIUM PHOSPHATE 10 MG/ML IJ SOLN
4.0000 mg | Freq: Once | INTRAMUSCULAR | Status: AC
Start: 2023-11-16 — End: 2023-11-16
  Administered 2023-11-16: 4 mg via INTRAVENOUS

## 2023-11-16 MED ORDER — SENNA 8.6 MG PO TABS: 1.0000 | ORAL_TABLET | Freq: Two times a day (BID) | ORAL | 0 refills | Status: AC | PRN

## 2023-11-16 MED ORDER — METHOCARBAMOL 1000 MG/10ML IJ SOLN: 500.0000 mg | Freq: Four times a day (QID) | INTRAMUSCULAR | Status: DC

## 2023-11-16 NOTE — Telephone Encounter (Signed)
 I spoke with the pharmacy. They said the answering service took care of this.

## 2023-11-16 NOTE — Plan of Care (Signed)
 Progressing towards discharge
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
## 2023-11-16 NOTE — Progress Notes (Signed)
   Neurosurgery Progress Note  History: Beverly Wright is s/p C4-6 ACDF  POD1: patient having neck pain and nausea with vomiting this morning   Physical Exam: Vitals:   11/16/23 0530 11/16/23 0742  BP: 116/74 118/67  Pulse: 71 61  Resp: 16 18  Temp: 98 F (36.7 C) 97.9 F (36.6 C)  SpO2: 94% 93%    AA Ox3 CNI MAEW Incision c/d/I with dermabond in place  Data:  Other tests/results:  11/16/23 cervical xrays Showing adequate hardware placement   Assessment/Plan:  Beverly Wright is a 53 y.o presenting with cervical radiculopathy and RUE weakness s/p C4-6 ACDF  - mobilize - pain control - DVT prophylaxis - added phenergan  for refractory nausea.  - PTOT  Edsel Goods PA-C Department of Neurosurgery

## 2023-11-16 NOTE — TOC Progression Note (Signed)
 Transition of Care [facility]) - Progression Note    Patient Details  Name: Beverly Wright MRN: [id number] Date of Birth: Feb 16, 1971  Transition of Care [facility]) CM/SW Contact  [name], RN Phone Number: 11/16/2023, 11:45 AM  Clinical Narrative:    Requested a RW and 3 in1 to be delivered to the bedside by [employer]        Expected Discharge Plan and Services         Expected Discharge Date: 11/16/23                                     Social Determinants of Health (SDOH) Interventions SDOH Screenings   Food Insecurity: No Food Insecurity (11/15/2023)  Housing: Low Risk  (11/15/2023)  Transportation Needs: No Transportation Needs (11/15/2023)  Utilities: Not At Risk (11/15/2023)  Depression (PHQ2-9): High Risk (07/12/2023)  Tobacco Use: Medium Risk (11/15/2023)    Readmission Risk Interventions     No data to display

## 2023-11-16 NOTE — Discharge Summary (Signed)
 Discharge Summary  Patient ID: Beverly Wright MRN: [id number] DOB/AGE: 1971/08/02 53 y.o.  Admit date: 11/15/2023 Discharge date: 11/16/2023  Admission Diagnoses: Cervical radiculopathy, cervical spondylosis, foraminal stenosis, loss of lordosis   Discharge Diagnoses:  Principal Problem:   Cervical radiculopathy Active Problems:   Hand pain, left   Cervical disc disorder with radiculopathy of cervical region   Right arm weakness   Cervical spondylosis without myelopathy   Discharged Condition: good  Hospital Course:  Beverly Wright is a 53 y.o presenting with cervical radiculopathy and RUE weakness s/p C4-6 ACDF. Her intraoperative course was uncomplicated. She was admitted overnight for monitoring and pain control.  Her postoperative course was complicated by pain and nausea and vomiting which resolved with medication changes.  She was seen and evaluated by therapy and deemed appropriate for discharge home on postop day 1.  She was given medications for pain, muscle relaxer, stool softener and nausea to take as needed.  Consults: None  Significant Diagnostic Studies:  11/16/23 Cervical xrays  Showing adequate hardware placement. Final radiology read pending.   Treatments: surgery: as above. Please see separately dictated operative report for further details   Discharge Exam: Blood pressure 118/67, pulse 61, temperature 97.9 F (36.6 C), temperature source (P) Oral, resp. rate 18, height 5' 6.5 (1.689 m), weight 114.1 kg, last menstrual period 11/10/2016, SpO2 93%. AA Ox3 CNI MAEW Incision c/d/I with dermabond in place   Disposition: Discharge disposition: 01-Home or Self Care       Discharge Instructions     Incentive spirometry RT   Complete by: As directed       Allergies as of 11/16/2023   No Known Allergies      Medication List     STOP taking these medications    HYDROcodone -acetaminophen  5-325 MG tablet Commonly known as: NORCO/VICODIN        TAKE these medications    albuterol  108 (90 Base) MCG/ACT inhaler Commonly known as: VENTOLIN  HFA Inhale 1-2 puffs into the lungs every 6 (six) hours as needed for wheezing or shortness of breath.   CALCIUM  CARBONATE PO Take 1 tablet by mouth in the morning and at bedtime.   cyanocobalamin  500 MCG tablet Commonly known as: VITAMIN B12 Take 500 mcg by mouth daily.   famotidine  20 MG tablet Commonly known as: PEPCID  Take 20 mg by mouth every morning.   fluticasone  110 MCG/ACT inhaler Commonly known as: Flovent  HFA Inhale 1 puff into the lungs 2 (two) times daily. Rinse mouth after each use.   methocarbamol  500 MG tablet Commonly known as: ROBAXIN  Take 1 tablet (500 mg total) by mouth every 6 (six) hours.   ondansetron  4 MG disintegrating tablet Commonly known as: ZOFRAN -ODT Take 1 tablet (4 mg total) by mouth every 8 (eight) hours as needed for nausea or vomiting.   ondansetron  4 MG tablet Commonly known as: ZOFRAN  Take 4 mg by mouth every 8 (eight) hours as needed for nausea or vomiting.   OVER THE COUNTER MEDICATION Take 2 capsules by mouth daily. Focus Factor   oxyCODONE  5 MG immediate release tablet Commonly known as: Oxy IR/ROXICODONE  Take 1-2 tablets (5-10 mg total) by mouth every 4 (four) hours as needed for up to 5 days for severe pain (pain score 7-10).   predniSONE  20 MG tablet Commonly known as: DELTASONE  2 tabs po daily for 5 days, then 1 tab po daily for 5 days What changed:  how much to take how to take this when to take this  propranolol  ER 80 MG 24 hr capsule Commonly known as: INDERAL  LA Take 1 capsule (80 mg total) by mouth daily. What changed:  when to take this additional instructions   senna 8.6 MG Tabs tablet Commonly known as: SENOKOT Take 1 tablet (8.6 mg total) by mouth 2 (two) times daily as needed for mild constipation.   VITAMIN D  PO Take 1 capsule by mouth daily.        Follow-up Information     Ulis Bottcher, PA-C  Follow up on 11/28/2023.   Specialty: Physician Assistant Contact information: [address] [place], [place] [address] [place] [address] [phone]                 Signed: Edsel Jama Goods 11/16/2023, 11:38 AM
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
## 2023-11-16 NOTE — Progress Notes (Signed)
 Physical Therapy Treatment Patient Details Name: Beverly Wright MRN: [id number] DOB: 1971/04/05 Today's Date: 11/16/2023   History of Present Illness Beverly Wright is a 53 y.o presenting with cervical radiculopathy and RUE weakness s/p C4-6 ACDF    PT Comments  Patient received seated EOB with nursing, requesting to use bathroom. Patient able to ambulate x 30 ft with RW, supervision. Then after using bathroom, patient able to complete additional 100 ft with RW. Patient still prefers use of RW for stability. Improvements in nausea, with no vomiting with mobility. Patient able to return to bed Mod I. Patient left with husband at bedside, and all needs in reach. All DME delivered to patient's room at time of treatment. RN Notified of mobility status. Patient will continue to benefit from acute skilled PT services while admitted. Discharge recommendation remains appropriate. Will continue to follow acutely.    If plan is discharge home, recommend the following: A little help with walking and/or transfers;A little help with bathing/dressing/bathroom;Assist for transportation;Help with stairs or ramp for entrance   Can travel by private vehicle        Equipment Recommendations  Rolling walker (2 wheels)    Recommendations for Other Services       Precautions / Restrictions Precautions Precautions: Cervical Precaution Booklet Issued: Yes (comment) (handout provided) Precaution Comments: Reviewed precautions; Restrictions Weight Bearing Restrictions Per Provider Order: No Other Position/Activity Restrictions: no brace needed     Mobility  Bed Mobility Overal bed mobility: Modified Independent Bed Mobility: Supine to Sit     Supine to sit: Supervision     General bed mobility comments: Received seated EOB; at end of session able to complete sit > supine with HOB elevated MOD I.    Transfers Overall transfer level: Needs assistance Equipment used: Rolling walker (2  wheels) Transfers: Sit to/from Stand Sit to Stand: Supervision           General transfer comment: multiple STS throughout session from various heights, including bed and toilet with supervision, no imbalance noted.    Ambulation/Gait Ambulation/Gait assistance: Supervision Gait Distance (Feet): 100 Feet Assistive device: Rolling walker (2 wheels) Gait Pattern/deviations: Step-through pattern Gait velocity: Decreased     General Gait Details: Pt with improved tolerance for ambulation, patient able to ambulate approx 30 ft from bed to bathroom, then plus an additional 100 ft prior to rest break. Patient supervision throughout. No overt LOB noted. Improved gait speed noted compared to session prior this date.   Stairs Stairs:  (do not anticipate any difficulty; patient refused to complete due to fatigue. Patient able to live on main level of home. No stair entrance needed to enter/exit home.)           Wheelchair Mobility     Tilt Bed    Modified Rankin (Stroke Patients Only)       Balance Overall balance assessment: Needs assistance Sitting-balance support: Feet supported, No upper extremity supported Sitting balance-Leahy Scale: Normal     Standing balance support: Bilateral upper extremity supported, During functional activity Standing balance-Leahy Scale: Good Standing balance comment: use of RW for support/stability                            Cognition Arousal: Alert Behavior During Therapy: WFL for tasks assessed/performed Overall Cognitive Status: Within Functional Limits for tasks assessed  Exercises      General Comments General comments (skin integrity, edema, etc.): Pt with improved nausea/vomiting this session.      Pertinent Vitals/Pain Pain Assessment Pain Assessment: 0-10 Pain Score: 4  Pain Location: Neck Pain Descriptors / Indicators: Aching, Sore, Tender Pain  Intervention(s): Limited activity within patient's tolerance, Monitored during session    Home Living Family/patient expects to be discharged to:: Private residence Living Arrangements: Spouse/significant other;Children   Type of Home: Other(Comment) (townhome) Home Access: Level entry     Alternate Level Stairs-Number of Steps: 12-13 Home Layout: Two level;Able to live on main level with bedroom/bathroom (sleeps in recliner) Home Equipment: None      Prior Function            PT Goals (current goals can now be found in the care plan section) Acute Rehab PT Goals Patient Stated Goal: Get Home PT Goal Formulation: With patient Time For Goal Achievement: 11/30/23 Potential to Achieve Goals: Good Progress towards PT goals: Progressing toward goals    Frequency    7X/week      PT Plan      Co-evaluation   Reason for Co-Treatment: For patient/therapist safety;To address functional/ADL transfers (pt initial tolerance)          AM-PAC PT 6 Clicks Mobility   Outcome Measure  Help needed turning from your back to your side while in a flat bed without using bedrails?: None Help needed moving from lying on your back to sitting on the side of a flat bed without using bedrails?: None Help needed moving to and from a bed to a chair (including a wheelchair)?: A Little Help needed standing up from a chair using your arms (e.g., wheelchair or bedside chair)?: A Little Help needed to walk in hospital room?: A Little Help needed climbing 3-5 steps with a railing? : A Little 6 Click Score: 20    End of Session Equipment Utilized During Treatment: Gait belt Activity Tolerance: Patient tolerated treatment well Patient left: in bed;with call bell/phone within reach;with family/visitor present Nurse Communication: Mobility status;Other (comment);Precautions PT Visit Diagnosis: Unsteadiness on feet (R26.81);Other abnormalities of gait and mobility (R26.89);Muscle weakness  (generalized) (M62.81);Pain Pain - Right/Left: Right Pain - part of body: Arm (Neck)     Time: 1300-1320 PT Time Calculation (min) (ACUTE ONLY): 20 min  Charges:    $Gait Training: 8-22 mins PT General Charges $$ ACUTE PT VISIT: 1 Visit                     Beverly Wright, PT, DPT 11/16/23 1:55 PM

## 2023-11-16 NOTE — Telephone Encounter (Signed)
  Media Information  Document Information  AMB Correspondence  AFTER HOURS CALL  11/16/2023 16:26  Attached To:  Beverly Wright  Source Information  Default, Provider, MD  Document History

## 2023-11-16 NOTE — Progress Notes (Signed)
 Patient is not able to walk the distance required to go the bathroom, or he/she is unable to safely negotiate stairs required to access the bathroom.  A 3in1 BSC will alleviate this problem

## 2023-11-19 ENCOUNTER — Other Ambulatory Visit: Payer: Self-pay | Admitting: Primary Care

## 2023-11-19 ENCOUNTER — Other Ambulatory Visit: Payer: Self-pay | Admitting: Neurosurgery

## 2023-11-19 ENCOUNTER — Encounter: Payer: Self-pay | Admitting: Neurosurgery

## 2023-11-19 MED ORDER — PROMETHAZINE HCL 25 MG PO TABS: 25.0000 mg | ORAL_TABLET | Freq: Four times a day (QID) | ORAL | 0 refills | Status: DC | PRN

## 2023-11-26 ENCOUNTER — Other Ambulatory Visit: Payer: Self-pay | Admitting: Physician Assistant

## 2023-11-26 ENCOUNTER — Telehealth: Payer: Self-pay | Admitting: Neurosurgery

## 2023-11-26 MED ORDER — OXYCODONE HCL 5 MG PO TABS
5.0000 mg | ORAL_TABLET | ORAL | 0 refills | Status: AC | PRN
Start: 2023-11-26 — End: 2023-12-01

## 2023-11-26 MED ORDER — BACLOFEN 10 MG PO TABS: 10.0000 mg | ORAL_TABLET | Freq: Three times a day (TID) | ORAL | 0 refills | Status: DC | PRN

## 2023-11-26 NOTE — Telephone Encounter (Signed)
C4-6 ACDF on 11/15/23  Oxycodone 5 mg 1-2 tablet every 4 hours She is completely out and in excruciating pain. Can this please be sent in asap.

## 2023-11-26 NOTE — Telephone Encounter (Signed)
I notified the patient to stop methocarbamol and take baclofen instead. She will notify us if this doesn't help. She has a virtual appointment on Wednesday and will send a picture of her incision through mychart prior to the appointment.

## 2023-11-26 NOTE — Telephone Encounter (Addendum)
Sorry, [facility] [address] Methocarbamol 500mg  every 6 hours Senokot Phenergan   Her pain in her neck into the center of her shoulder blades.

## 2023-11-26 NOTE — Telephone Encounter (Signed)
I spoke with the patient and informed her that the oxycodone refill was sent. I explained that it is not uncommon to have pain in the back of the neck and into the shoulder blades after the surgery she had.  I also encouraged her to try adding tylenol to her regimen (as directed on the bottle).  She does not notice any difference when she takes the methocarbamol. Can she try a different muscle relaxer?

## 2023-11-27 ENCOUNTER — Encounter: Payer: Self-pay | Admitting: Family Medicine

## 2023-11-28 ENCOUNTER — Ambulatory Visit: Payer: Medicaid Other | Admitting: Physician Assistant

## 2023-11-28 DIAGNOSIS — M5010 Cervical disc disorder with radiculopathy, unspecified cervical region: Secondary | ICD-10-CM

## 2023-11-28 DIAGNOSIS — Z9889 Other specified postprocedural states: Secondary | ICD-10-CM

## 2023-11-28 NOTE — Telephone Encounter (Signed)
Yes, the patient will need to call [facility] and request a CD be burnt with images. Reports are in Epic and they should be able to pull everything from reports to referral request and all notes associated with this referral.   The Referral Coordinator faxed everything to them so they should have received the referral.   It sounds like they are scheduling the patient but the hold up at this time is the Imaging disc.   Please let us know if there is anything further needed from the Referral standpoint.

## 2023-11-28 NOTE — Progress Notes (Unsigned)
DOS: 11/15/23, C4-6 ACDF  I connected with  Beverly Wright on 11/28/23 via telephone and verified that I am speaking with the correct person using two identifiers.   I discussed the limitations of evaluation and management by telemedicine. The patient expressed understanding and agreed to proceed.   Patient has some pain, but medication recently was changed to baclofen which is working much better for her.  She continues to take oxycodone, but not as often.  She has no new pain numbness or tingling in the right arm.  She does have some feeling of difficulty swallowing.  This is not precluded her to eat or drink.  She states it is been getting progressively better, but we did discuss if it were to persist for her to see ENT and get a swallow study.

## 2023-12-13 ENCOUNTER — Other Ambulatory Visit: Payer: Self-pay | Admitting: Neurosurgery

## 2023-12-13 ENCOUNTER — Telehealth: Payer: Self-pay

## 2023-12-13 MED ORDER — OXYCODONE HCL 5 MG PO TABS: 5.0000 mg | ORAL_TABLET | Freq: Four times a day (QID) | ORAL | 0 refills | Status: DC | PRN

## 2023-12-13 MED ORDER — CELECOXIB 200 MG PO CAPS: 200.0000 mg | ORAL_CAPSULE | Freq: Two times a day (BID) | ORAL | 0 refills | Status: DC

## 2023-12-13 NOTE — Telephone Encounter (Signed)
 Beverly Wright called in reporting continued pain at the back of her neck and in between her above the area in between her shoulder blades.  We discussed that this is not uncommon with the surgery that she had.   She is also still getting choked when eating sometimes (particularly chicken and pork chops) and asked if this is normal. We discussed that this is not uncommon at this point in time with the surgery she had. I encouraged her to try smaller bites, chew longer, or avoid chicken and pork chops for a little bit longer. She is able to tolerate pudding and other foods.  She is out of pain medicine and has been trying to get by without it.  She also reports lower back pain that used to be relieved with heat/ice and aleve. But she has not been taking aleve because we advised her to refrain from taking it due to her cervical fusion.  She is taking: Tylenol arthritis 650mg  - 2 in the morning and 2 in the afternoon  Baclofen (can only take after she picks her kids up from school and bed time b/c it makes her too sleepy). She feels this helps.   [facility] [address]

## 2023-12-13 NOTE — Telephone Encounter (Signed)
 I notified the patient and advised her to contact us if the medications do not help so we can move her appointment up.

## 2023-12-14 ENCOUNTER — Other Ambulatory Visit: Payer: Self-pay

## 2023-12-14 DIAGNOSIS — Z1231 Encounter for screening mammogram for malignant neoplasm of breast: Secondary | ICD-10-CM
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
## 2023-12-24 ENCOUNTER — Encounter: Payer: Self-pay | Admitting: Family Medicine

## 2023-12-24 ENCOUNTER — Ambulatory Visit: Payer: Medicaid Other | Admitting: Family Medicine

## 2023-12-24 VITALS — BP 130/72 | HR 86 | Temp 97.8°F | Ht 66.5 in | Wt 253.1 lb

## 2023-12-24 DIAGNOSIS — M79642 Pain in left hand: Secondary | ICD-10-CM | POA: Diagnosis not present

## 2023-12-24 DIAGNOSIS — M545 Low back pain, unspecified: Secondary | ICD-10-CM | POA: Diagnosis not present

## 2023-12-24 DIAGNOSIS — M5010 Cervical disc disorder with radiculopathy, unspecified cervical region: Secondary | ICD-10-CM | POA: Diagnosis not present

## 2023-12-24 DIAGNOSIS — F0781 Postconcussional syndrome: Secondary | ICD-10-CM | POA: Diagnosis not present

## 2023-12-24 DIAGNOSIS — G8929 Other chronic pain: Secondary | ICD-10-CM

## 2023-12-24 MED ORDER — PROPRANOLOL HCL 20 MG PO TABS: 20.0000 mg | ORAL_TABLET | Freq: Two times a day (BID) | ORAL | 2 refills | Status: DC

## 2023-12-24 NOTE — Telephone Encounter (Signed)
 Form printed.  Patient is being seen today by Dr. Patsy Lager.

## 2023-12-24 NOTE — Telephone Encounter (Signed)
 Completed in the office.

## 2023-12-25 ENCOUNTER — Encounter: Payer: Self-pay | Admitting: Family Medicine

## 2023-12-26 ENCOUNTER — Ambulatory Visit
Admission: RE | Admit: 2023-12-26 | Discharge: 2023-12-26 | Disposition: A | Discharge status: Home / Self Care | Source: Ambulatory Visit | Attending: Neurosurgery | Admitting: Neurosurgery

## 2023-12-26 ENCOUNTER — Ambulatory Visit (INDEPENDENT_AMBULATORY_CARE_PROVIDER_SITE_OTHER): Payer: Medicaid Other | Admitting: Neurosurgery

## 2023-12-26 ENCOUNTER — Ambulatory Visit
Admission: RE | Admit: 2023-12-26 | Discharge: 2023-12-26 | Disposition: A | Discharge status: Home / Self Care | Attending: Neurosurgery | Admitting: Neurosurgery

## 2023-12-26 ENCOUNTER — Other Ambulatory Visit: Payer: Self-pay

## 2023-12-26 VITALS — BP 126/80 | Temp 98.1°F | Ht 66.5 in | Wt 253.0 lb

## 2023-12-26 DIAGNOSIS — M5010 Cervical disc disorder with radiculopathy, unspecified cervical region: Secondary | ICD-10-CM

## 2023-12-26 DIAGNOSIS — G8929 Other chronic pain: Secondary | ICD-10-CM

## 2023-12-26 DIAGNOSIS — M5441 Lumbago with sciatica, right side: Secondary | ICD-10-CM

## 2023-12-26 DIAGNOSIS — Z981 Arthrodesis status: Secondary | ICD-10-CM

## 2023-12-26 DIAGNOSIS — M47812 Spondylosis without myelopathy or radiculopathy, cervical region: Secondary | ICD-10-CM

## 2023-12-26 DIAGNOSIS — M5442 Lumbago with sciatica, left side: Secondary | ICD-10-CM

## 2023-12-26 DIAGNOSIS — Z9889 Other specified postprocedural states: Secondary | ICD-10-CM

## 2023-12-26 MED ORDER — OXYCODONE HCL 5 MG PO TABS: 5.0000 mg | ORAL_TABLET | ORAL | 0 refills | Status: AC | PRN

## 2023-12-26 MED ORDER — BACLOFEN 10 MG PO TABS
10.0000 mg | ORAL_TABLET | Freq: Three times a day (TID) | ORAL | 0 refills | Status: DC | PRN
Start: 2023-12-26 — End: 2024-01-28

## 2023-12-26 NOTE — Progress Notes (Signed)
   DOS: 12/05/23  HISTORY OF PRESENT ILLNESS: 12/26/2023 [name] is status post C4-C6 ACDF on 11/15/2023.  Overall she states that her right arm pain has completely gone.  Her swallowing has improved since she was last seen.  She does continue to have neck soreness on the posterior aspect of her neck.  She does state that this does get exacerbated when she has to take care of her 53-year-old granddaughter with special needs.  This is often causing her to need to lift and exert more than the 10 pound lifting restrictions.  PHYSICAL EXAMINATION:   Vitals:   12/26/23 1115  BP: 126/80  Temp: 98.1 F (36.7 C)   General: Patient is well developed, well nourished, calm, collected, and in no apparent distress.  NEUROLOGICAL:  General: In no acute distress.  Awake, alert, oriented to person, place, and time. Pupils equal round and reactive to light.   Strength: Improved strength in the right upper extremity, at least 4+ throughout all tested myotomes  Incision c/d/i   ROS (Neurologic): Negative except as noted above  IMAGING: Imaging with no acute hardware failure, will follow-up on the follow up imaging with reads  ASSESSMENT/PLAN:  Beverly Wright is doing well after C4-C6 ACDF.  She has had a complete improvement in her right upper extremity pain.  She also feels that she is increase strength.  Her major complaint at this time is posterior neck pain, she notices this when she overexerts herself.  On physical examination she has an improvement in all tested myotomes in the right upper extremity.  Her x-rays show hardware in good position without any active failure.  Plan on continuing to see her at her 12-week follow-up.  At that time would like to obtain lumbar spine x-rays for her chronic lumbar spondylosis now that her neck has been treated.  Lovenia Kim, MD/MS Department of Neurosurgery

## 2024-01-03 ENCOUNTER — Encounter: Payer: Self-pay | Admitting: Neurosurgery

## 2024-01-06 ENCOUNTER — Other Ambulatory Visit: Payer: Self-pay | Admitting: Primary Care

## 2024-01-06 ENCOUNTER — Other Ambulatory Visit: Payer: Self-pay | Admitting: Neurosurgery

## 2024-01-06 DIAGNOSIS — J4520 Mild intermittent asthma, uncomplicated: Secondary | ICD-10-CM

## 2024-01-14 ENCOUNTER — Ambulatory Visit
Admission: RE | Admit: 2024-01-14 | Discharge: 2024-01-14 | Disposition: A | Discharge status: Home / Self Care | Source: Ambulatory Visit | Attending: Primary Care

## 2024-01-14 DIAGNOSIS — Z1231 Encounter for screening mammogram for malignant neoplasm of breast: Secondary | ICD-10-CM | POA: Diagnosis not present

## 2024-01-18 ENCOUNTER — Telehealth: Payer: Self-pay | Admitting: Neurosurgery

## 2024-01-18 DIAGNOSIS — M5010 Cervical disc disorder with radiculopathy, unspecified cervical region: Secondary | ICD-10-CM

## 2024-01-18 DIAGNOSIS — Z981 Arthrodesis status: Secondary | ICD-10-CM

## 2024-01-18 DIAGNOSIS — M47812 Spondylosis without myelopathy or radiculopathy, cervical region: Secondary | ICD-10-CM

## 2024-01-18 MED ORDER — OXYCODONE HCL 5 MG PO TABS: 5.0000 mg | ORAL_TABLET | Freq: Three times a day (TID) | ORAL | 0 refills | Status: DC | PRN

## 2024-01-18 NOTE — Addendum Note (Signed)
 Addended byDrake Leach on: 01/18/2024 02:51 PM   Modules accepted: Orders

## 2024-01-18 NOTE — Telephone Encounter (Signed)
 Ok to send in oxycodone?

## 2024-01-18 NOTE — Telephone Encounter (Signed)
 She is taking Celebrex and Baclofen.   Breakthrough pain 7/10   Steroid dose pack makes her feel awful-- She says it makes her feel like she wants to rip her chest open... She states that this is because of her asthma.   She wants to know if there are any other suggestions. She is trying to limit her activity as much as possible but it is hard while taking care of her  53 year old autistic grandchild who lives with her.

## 2024-01-18 NOTE — Telephone Encounter (Signed)
 Will give her limited refill of oxycodone as nothing else is helping. Please let her know to take only as needed for severe pain. Remind her that it can make sleepy and/or constipated.   Also- she cannot take more than 3000mg  of tylenol in a 24 hour period.    PMP reviewed and is appropriate.   May need to consider referral to pain management if she continues to need pain medications, but will discuss with Dr. Katrinka Blazing on Monday.

## 2024-01-18 NOTE — Telephone Encounter (Signed)
 I spoke with Patient. She indicates understanding.

## 2024-01-18 NOTE — Telephone Encounter (Signed)
 She is status post C4-C6 ACDF on 11/15/2023.   Pain  medication is not recommended this far out from surgery.   Is she still taking the celebrex and baclofen?   Could consider a steroid dose pack. Please let me know.

## 2024-01-18 NOTE — Telephone Encounter (Signed)
 Patient called requesting pain medication for her neck. She is currently taking Tylenol 650MG  for her pain (2 every 8 hours) but this is not helping. Please advise

## 2024-01-21 NOTE — Addendum Note (Signed)
 Addended byLucetta Russel on: 01/21/2024 12:07 PM   Modules accepted: Orders

## 2024-01-21 NOTE — Telephone Encounter (Signed)
 Reviewed with Dr. Felipe Horton. Can refill oxycodone for now, but need to get pain management involved as she likely will need medications long term.   Spoke with patient. She would like to stay in [place] if possible. Will do referral to [facility].

## 2024-01-23 ENCOUNTER — Ambulatory Visit: Admitting: Orthopedic Surgery

## 2024-01-23 ENCOUNTER — Other Ambulatory Visit (INDEPENDENT_AMBULATORY_CARE_PROVIDER_SITE_OTHER): Payer: Self-pay

## 2024-01-23 DIAGNOSIS — R200 Anesthesia of skin: Secondary | ICD-10-CM

## 2024-01-23 DIAGNOSIS — M79642 Pain in left hand: Secondary | ICD-10-CM | POA: Diagnosis not present

## 2024-01-23 DIAGNOSIS — M1812 Unilateral primary osteoarthritis of first carpometacarpal joint, left hand: Secondary | ICD-10-CM | POA: Diagnosis not present

## 2024-01-23 DIAGNOSIS — R202 Paresthesia of skin: Secondary | ICD-10-CM

## 2024-01-23 MED ORDER — LIDOCAINE HCL 1 % IJ SOLN
1.0000 mL | INTRAMUSCULAR | Status: AC | PRN
  Administered 2024-01-23: 1 mL

## 2024-01-23 MED ORDER — BETAMETHASONE SOD PHOS & ACET 6 (3-3) MG/ML IJ SUSP
6.0000 mg | INTRAMUSCULAR | Status: AC | PRN
  Administered 2024-01-23: 6 mg via INTRA_ARTICULAR
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
## 2024-01-28 ENCOUNTER — Other Ambulatory Visit: Payer: Self-pay | Admitting: Neurosurgery

## 2024-01-28 DIAGNOSIS — M5010 Cervical disc disorder with radiculopathy, unspecified cervical region: Secondary | ICD-10-CM

## 2024-01-28 DIAGNOSIS — M47812 Spondylosis without myelopathy or radiculopathy, cervical region: Secondary | ICD-10-CM

## 2024-01-28 DIAGNOSIS — Z9889 Other specified postprocedural states: Secondary | ICD-10-CM

## 2024-01-29 ENCOUNTER — Ambulatory Visit: Attending: Orthopedic Surgery

## 2024-01-29 DIAGNOSIS — M1812 Unilateral primary osteoarthritis of first carpometacarpal joint, left hand: Secondary | ICD-10-CM | POA: Diagnosis not present

## 2024-01-29 DIAGNOSIS — M6281 Muscle weakness (generalized): Secondary | ICD-10-CM | POA: Insufficient documentation

## 2024-01-29 DIAGNOSIS — M79642 Pain in left hand: Secondary | ICD-10-CM | POA: Diagnosis not present

## 2024-01-29 DIAGNOSIS — R278 Other lack of coordination: Secondary | ICD-10-CM | POA: Insufficient documentation

## 2024-01-29 MED ORDER — BACLOFEN 10 MG PO TABS: 10.0000 mg | ORAL_TABLET | Freq: Three times a day (TID) | ORAL | 0 refills | Status: DC | PRN
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
## 2024-01-29 NOTE — Telephone Encounter (Signed)
 Patient is on the phone wanting to follow up on the refill request of Oxycodone . She states she is still waiting on a call back from [facility]. Please advise.

## 2024-01-30 ENCOUNTER — Telehealth: Payer: Self-pay

## 2024-01-30 ENCOUNTER — Ambulatory Visit (INDEPENDENT_AMBULATORY_CARE_PROVIDER_SITE_OTHER): Admitting: Neurosurgery

## 2024-01-30 DIAGNOSIS — M47812 Spondylosis without myelopathy or radiculopathy, cervical region: Secondary | ICD-10-CM

## 2024-01-30 DIAGNOSIS — M5010 Cervical disc disorder with radiculopathy, unspecified cervical region: Secondary | ICD-10-CM

## 2024-01-30 DIAGNOSIS — Z981 Arthrodesis status: Secondary | ICD-10-CM

## 2024-01-30 MED ORDER — OXYCODONE HCL 5 MG PO TABS: 5.0000 mg | ORAL_TABLET | Freq: Three times a day (TID) | ORAL | 0 refills | Status: AC | PRN

## 2024-01-30 NOTE — Telephone Encounter (Signed)
 Patient called back to follow up on her medication request. She is scheduled for her last post op on 02/06/2024 and is scheduled with the pain clinic on 02/18/2024. Would you like to bring her in on Monday to be seen or can you send in enough medication until she is seen next week?

## 2024-01-30 NOTE — Telephone Encounter (Signed)
 Request is still pending review at this time.

## 2024-01-30 NOTE — Telephone Encounter (Signed)
 Received authorization request from [employer] for oxycodone . An expedited request has been submitted and is pending review.  (Key: [id number]) - [id number]

## 2024-01-30 NOTE — Progress Notes (Signed)
 I had a follow-up phone call with Beverly Wright today.  She continues to have posterior neck pain mostly at the base of her skull and bottom of her neck.  She states that this is sharp and intermittent.  Its mostly while she is doing her activities of daily living and caring for her grandchild.  She constantly uses heat Tylenol  Celebrex  and her muscle relaxants.  She intermittently needs to utilize her oxycodone  and has been using less than prescribed.  At this point she does still need it for intermittent breakthrough so I have reordered a prescription that we will get her through until she is able to establish with her pain clinician early in May

## 2024-01-31 ENCOUNTER — Ambulatory Visit

## 2024-01-31 DIAGNOSIS — M1812 Unilateral primary osteoarthritis of first carpometacarpal joint, left hand: Secondary | ICD-10-CM

## 2024-01-31 DIAGNOSIS — R278 Other lack of coordination: Secondary | ICD-10-CM

## 2024-01-31 DIAGNOSIS — M6281 Muscle weakness (generalized): Secondary | ICD-10-CM | POA: Diagnosis not present

## 2024-01-31 DIAGNOSIS — M79642 Pain in left hand: Secondary | ICD-10-CM

## 2024-01-31 NOTE — Telephone Encounter (Signed)
 This has been approved. Tracking # [id number] and is valid from 01/31/24 to 07/29/24. I have faxed this to the pharmacy.

## 2024-02-01 ENCOUNTER — Other Ambulatory Visit: Payer: Self-pay | Admitting: Neurosurgery
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
## 2024-02-05 ENCOUNTER — Ambulatory Visit

## 2024-02-05 ENCOUNTER — Other Ambulatory Visit: Payer: Self-pay

## 2024-02-05 ENCOUNTER — Encounter: Admitting: Physical Medicine and Rehabilitation

## 2024-02-05 DIAGNOSIS — M47812 Spondylosis without myelopathy or radiculopathy, cervical region: Secondary | ICD-10-CM

## 2024-02-06 ENCOUNTER — Ambulatory Visit
Admission: RE | Admit: 2024-02-06 | Discharge: 2024-02-06 | Disposition: A | Discharge status: Home / Self Care | Attending: Physician Assistant | Admitting: Physician Assistant

## 2024-02-06 ENCOUNTER — Ambulatory Visit
Admission: RE | Admit: 2024-02-06 | Discharge: 2024-02-06 | Disposition: A | Discharge status: Home / Self Care | Source: Ambulatory Visit | Attending: Neurosurgery | Admitting: Neurosurgery

## 2024-02-06 ENCOUNTER — Ambulatory Visit (INDEPENDENT_AMBULATORY_CARE_PROVIDER_SITE_OTHER): Payer: Medicaid Other | Admitting: Physician Assistant

## 2024-02-06 ENCOUNTER — Encounter: Payer: Self-pay | Admitting: Physician Assistant

## 2024-02-06 VITALS — BP 126/76 | Temp 98.3°F | Wt 253.0 lb

## 2024-02-06 DIAGNOSIS — M47812 Spondylosis without myelopathy or radiculopathy, cervical region: Secondary | ICD-10-CM

## 2024-02-06 DIAGNOSIS — M7989 Other specified soft tissue disorders: Secondary | ICD-10-CM

## 2024-02-06 DIAGNOSIS — M5442 Lumbago with sciatica, left side: Secondary | ICD-10-CM | POA: Insufficient documentation

## 2024-02-06 DIAGNOSIS — Z981 Arthrodesis status: Secondary | ICD-10-CM

## 2024-02-06 DIAGNOSIS — M5441 Lumbago with sciatica, right side: Secondary | ICD-10-CM | POA: Diagnosis not present

## 2024-02-06 DIAGNOSIS — M545 Low back pain, unspecified: Secondary | ICD-10-CM | POA: Diagnosis not present

## 2024-02-06 DIAGNOSIS — M4802 Spinal stenosis, cervical region: Secondary | ICD-10-CM | POA: Diagnosis not present

## 2024-02-06 DIAGNOSIS — M419 Scoliosis, unspecified: Secondary | ICD-10-CM | POA: Diagnosis not present

## 2024-02-06 DIAGNOSIS — M47816 Spondylosis without myelopathy or radiculopathy, lumbar region: Secondary | ICD-10-CM | POA: Diagnosis not present

## 2024-02-06 DIAGNOSIS — G8929 Other chronic pain: Secondary | ICD-10-CM

## 2024-02-06 DIAGNOSIS — M4722 Other spondylosis with radiculopathy, cervical region: Secondary | ICD-10-CM

## 2024-02-06 MED ORDER — GABAPENTIN 100 MG PO CAPS: 100.0000 mg | ORAL_CAPSULE | Freq: Three times a day (TID) | ORAL | 0 refills | Status: DC

## 2024-02-06 NOTE — Progress Notes (Signed)
   DOS: 12/05/23  HISTORY OF PRESENT ILLNESS: 02/06/2024 [name] is status post C4-C6 ACDF on 11/15/2023.  Overall she is doing pretty well.  Unfortunately she has had some returning numbness and tingling in her 3rd and 4th digits of her right upper extremity.  She has some nerve pain that is burning in nature at the top of her shoulder blades in her posterior neck area.  She been taking baclofen  at night which has been working well.  In addition, she complains of multiple lumps in her right humerus that she can palpate and cause her pain.  She for started noticing these in the past week or so.   PHYSICAL EXAMINATION:   Vitals:   02/06/24 1029  BP: 126/76  Temp: 98.3 F (36.8 C)   General: Patient is well developed, well nourished, calm, collected, and in no apparent distress.  NEUROLOGICAL:  General: In no acute distress.  Awake, alert, oriented to person, place, and time. Pupils equal round and reactive to light.   Strength: Improved strength in the right upper extremity, at least 4+ throughout all tested myotomes  Soft tissue mass noted on anterolateral aspect of mid to upper third of right humerus.  Is easily palpable and solid in nature.     Incision c/d/i   ROS (Neurologic): Negative except as noted above  IMAGING: Imaging with no acute hardware failure, will follow-up on the follow up imaging with reads  ASSESSMENT/PLAN:  Beverly Wright is status post C4-C6 ACDF on 11/15/2023.  Overall she is doing pretty well.  Unfortunately she has had some returning numbness and tingling in her 3rd and 4th digits of her right upper extremity.  She has some nerve pain that is burning in nature at the top of her shoulder blades in her posterior neck area.  She been taking baclofen  at night which has been working well.In addition, she complains of multiple lumps in her right humerus that she can palpate and cause her pain.  She for started noticing these in the past week or  so.   On examination, she has at least 4+ throughout bilateral upper extremities.  Some wasting is still noted in her right hand.Soft tissue mass noted on anterolateral aspect of mid to upper third of right humerus.  Is easily palpable and solid in nature.    Plan:  -MRI with and without contrast to evaluate small tissue mass in right upper extremity - For patient's pain, discussed gabapentin  at length.  Patient willing to trial low-dose of gabapentin  at this time to help with her burning nerve pain.  She will reestablish with her pain physician in 2 weeks.  Will review results once complete and go from there.  She is encouraged to reach out to us  in the meantime for any questions or concerns.  I am hopeful that the numbness and tingling in her right upper extremity improves with time and the addition of gabapentin .  Otherwise she has had a good result from her cervical spine surgery and the burning pain in between her shoulder blades is as expected.  Ludwig Safer Department of Neurosurgery

## 2024-02-07 ENCOUNTER — Ambulatory Visit: Attending: Orthopedic Surgery

## 2024-02-07 DIAGNOSIS — M7989 Other specified soft tissue disorders: Secondary | ICD-10-CM | POA: Diagnosis present

## 2024-02-07 DIAGNOSIS — M6281 Muscle weakness (generalized): Secondary | ICD-10-CM | POA: Insufficient documentation

## 2024-02-07 DIAGNOSIS — M1812 Unilateral primary osteoarthritis of first carpometacarpal joint, left hand: Secondary | ICD-10-CM | POA: Insufficient documentation

## 2024-02-07 DIAGNOSIS — R278 Other lack of coordination: Secondary | ICD-10-CM | POA: Diagnosis not present

## 2024-02-07 DIAGNOSIS — M79642 Pain in left hand: Secondary | ICD-10-CM | POA: Diagnosis not present

## 2024-02-08 ENCOUNTER — Encounter: Payer: Self-pay | Admitting: Neurosurgery

## 2024-02-09 ENCOUNTER — Ambulatory Visit: Admission: RE | Admit: 2024-02-09 | Source: Ambulatory Visit
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
## 2024-02-12 ENCOUNTER — Ambulatory Visit

## 2024-02-12 ENCOUNTER — Ambulatory Visit: Admitting: Physical Medicine and Rehabilitation

## 2024-02-12 DIAGNOSIS — R202 Paresthesia of skin: Secondary | ICD-10-CM | POA: Diagnosis not present

## 2024-02-12 DIAGNOSIS — R200 Anesthesia of skin: Secondary | ICD-10-CM

## 2024-02-12 DIAGNOSIS — M79642 Pain in left hand: Secondary | ICD-10-CM

## 2024-02-12 DIAGNOSIS — Z981 Arthrodesis status: Secondary | ICD-10-CM

## 2024-02-12 NOTE — Progress Notes (Signed)
 Pain Scale   Average Pain 2 Patient advising she has numbness and tingling in her left hand and stats the had temperature feels colder than her right hand. Patient is Right hand Dominate         +Driver, -BT, -Dye Allergies.

## 2024-02-13 ENCOUNTER — Telehealth: Payer: Self-pay | Admitting: Orthopedic Surgery

## 2024-02-13 NOTE — Telephone Encounter (Signed)
 Patient called and said she was returning your call for test results. CB#[phone]

## 2024-02-14 ENCOUNTER — Ambulatory Visit

## 2024-02-14 ENCOUNTER — Ambulatory Visit
Admission: RE | Admit: 2024-02-14 | Discharge: 2024-02-14 | Disposition: A | Discharge status: Home / Self Care | Source: Ambulatory Visit | Attending: Physician Assistant | Admitting: Physician Assistant

## 2024-02-14 DIAGNOSIS — M79642 Pain in left hand: Secondary | ICD-10-CM | POA: Diagnosis not present

## 2024-02-14 DIAGNOSIS — R278 Other lack of coordination: Secondary | ICD-10-CM

## 2024-02-14 DIAGNOSIS — R29898 Other symptoms and signs involving the musculoskeletal system: Secondary | ICD-10-CM | POA: Diagnosis not present

## 2024-02-14 DIAGNOSIS — M1812 Unilateral primary osteoarthritis of first carpometacarpal joint, left hand: Secondary | ICD-10-CM | POA: Diagnosis not present

## 2024-02-14 DIAGNOSIS — M7989 Other specified soft tissue disorders: Secondary | ICD-10-CM | POA: Insufficient documentation

## 2024-02-14 DIAGNOSIS — M6281 Muscle weakness (generalized): Secondary | ICD-10-CM | POA: Diagnosis not present

## 2024-02-14 DIAGNOSIS — R2231 Localized swelling, mass and lump, right upper limb: Secondary | ICD-10-CM | POA: Diagnosis not present

## 2024-02-14 MED ORDER — GADOBUTROL 1 MMOL/ML IV SOLN
10.0000 mL | Freq: Once | INTRAVENOUS | Status: AC | PRN
  Administered 2024-02-14: 10 mL via INTRAVENOUS

## 2024-02-14 NOTE — Telephone Encounter (Signed)
Spoke with patient and she is scheduled
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
## 2024-02-18 ENCOUNTER — Encounter: Payer: Self-pay | Admitting: Physical Medicine and Rehabilitation

## 2024-02-18 DIAGNOSIS — Z9889 Other specified postprocedural states: Secondary | ICD-10-CM | POA: Diagnosis not present

## 2024-02-18 DIAGNOSIS — Z79891 Long term (current) use of opiate analgesic: Secondary | ICD-10-CM | POA: Diagnosis not present

## 2024-02-18 DIAGNOSIS — M47812 Spondylosis without myelopathy or radiculopathy, cervical region: Secondary | ICD-10-CM | POA: Diagnosis not present

## 2024-02-18 DIAGNOSIS — Z981 Arthrodesis status: Secondary | ICD-10-CM | POA: Diagnosis not present

## 2024-02-18 DIAGNOSIS — G894 Chronic pain syndrome: Secondary | ICD-10-CM | POA: Diagnosis not present

## 2024-02-18 DIAGNOSIS — M5010 Cervical disc disorder with radiculopathy, unspecified cervical region: Secondary | ICD-10-CM | POA: Diagnosis not present
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
## 2024-02-18 NOTE — Procedures (Signed)
 EMG & NCV Findings: Evaluation of the left median motor nerve showed prolonged distal onset latency (4.8 ms), reduced amplitude (2.6 mV), and decreased conduction velocity (Elbow-Wrist, 37 m/s).  The left median (across palm) sensory nerve showed prolonged distal peak latency (Wrist, 4.5 ms).  All remaining nerves (as indicated in the following tables) were within normal limits.    All examined muscles (as indicated in the following table) showed no evidence of electrical instability.    Impression: The above electrodiagnostic study is ABNORMAL and reveals evidence of a moderate to severe left median nerve entrapment at the wrist (carpal tunnel syndrome) affecting sensory and motor components.   There is no significant electrodiagnostic evidence of any other focal nerve entrapment, brachial plexopathy or cervical radiculopathy.  **As you know, this particular electrodiagnostic study cannot rule out chemical radiculitis or sensory only radiculopathy.  Recommendations: 1.  Follow-up with referring physician. 2.  Continue current management of symptoms. 3.  Continue use of resting splint at night-time and as needed during the day. 4.  Suggest surgical evaluation.  ___________________________ Collin Deal FAAPMR Board Certified, American Board of Physical Medicine and Rehabilitation    Nerve Conduction Studies Anti Sensory Summary Table   Stim Site NR Peak (ms) Norm Peak (ms) P-T Amp (V) Norm P-T Amp Site1 Site2 Delta-P (ms) Dist (cm) Vel (m/s) Norm Vel (m/s)  Left Median Acr Palm Anti Sensory (2nd Digit)  31.3C  Wrist    *4.5 <3.6 19.2 >10 Wrist Palm 2.5 0.0    Palm    2.0 <2.0 24.9         Left Radial Anti Sensory (Base 1st Digit)  31C  Wrist    2.0 <3.1 42.2  Wrist Base 1st Digit 2.0 0.0    Left Ulnar Anti Sensory (5th Digit)  31.9C  Wrist    3.4 <3.7 19.7 >15.0 Wrist 5th Digit 3.4 14.0 41 >38   Motor Summary Table   Stim Site NR Onset (ms) Norm Onset (ms) O-P Amp (mV) Norm O-P  Amp Site1 Site2 Delta-0 (ms) Dist (cm) Vel (m/s) Norm Vel (m/s)  Left Median Motor (Abd Poll Brev)  31.9C    Martin-Gruber  Wrist    *4.8 <4.2 *2.6 >5 Elbow Wrist 5.6 21.0 *37 >50  Elbow    10.4  4.8         Left Ulnar Motor (Abd Dig Min)  32.1C  Wrist    3.3 <4.2 7.1 >3 B Elbow Wrist 3.0 18.0 60 >53  B Elbow    6.3  5.1  A Elbow B Elbow 1.4 10.0 71 >53  A Elbow    7.7  6.6          EMG   Side Muscle Nerve Root Ins Act Fibs Psw Amp Dur Poly Recrt Int Deatra Face Comment  Left Abd Poll Brev Median C8-T1 Nml Nml Nml Nml Nml 0 Nml Nml   Left 1stDorInt Ulnar C8-T1 Nml Nml Nml Nml Nml 0 Nml Nml   Left PronatorTeres Median C6-7 Nml Nml Nml Nml Nml 0 Nml Nml   Left Biceps Musculocut C5-6 Nml Nml Nml Nml Nml 0 Nml Nml   Left Deltoid Axillary C5-6 Nml Nml Nml Nml Nml 0 Nml Nml     Nerve Conduction Studies Anti Sensory Left/Right Comparison   Stim Site L Lat (ms) R Lat (ms) L-R Lat (ms) L Amp (V) R Amp (V) L-R Amp (%) Site1 Site2 L Vel (m/s) R Vel (m/s) L-R Vel (m/s)  Median Acr Palm Anti  Sensory (2nd Digit)  31.3C  Wrist *4.5   19.2   Wrist Palm     Palm 2.0   24.9         Radial Anti Sensory (Base 1st Digit)  31C  Wrist 2.0   42.2   Wrist Base 1st Digit     Ulnar Anti Sensory (5th Digit)  31.9C  Wrist 3.4   19.7   Wrist 5th Digit 41     Motor Left/Right Comparison   Stim Site L Lat (ms) R Lat (ms) L-R Lat (ms) L Amp (mV) R Amp (mV) L-R Amp (%) Site1 Site2 L Vel (m/s) R Vel (m/s) L-R Vel (m/s)  Median Motor (Abd Poll Brev)  31.9C    Martin-Gruber  Wrist *4.8   *2.6   Elbow Wrist *37    Elbow 10.4   4.8         Ulnar Motor (Abd Dig Min)  32.1C  Wrist 3.3   7.1   B Elbow Wrist 60    B Elbow 6.3   5.1   A Elbow B Elbow 71    A Elbow 7.7   6.6            Waveforms:

## 2024-02-19 ENCOUNTER — Other Ambulatory Visit: Payer: Self-pay | Admitting: Primary Care

## 2024-02-19 ENCOUNTER — Ambulatory Visit

## 2024-02-19 ENCOUNTER — Ambulatory Visit: Admitting: Orthopedic Surgery

## 2024-02-19 ENCOUNTER — Telehealth: Payer: Self-pay | Admitting: Orthopedic Surgery

## 2024-02-19 DIAGNOSIS — J4520 Mild intermittent asthma, uncomplicated: Secondary | ICD-10-CM

## 2024-02-19 NOTE — Telephone Encounter (Signed)
 Dr. Merlinda Starling called and spoke with patient and went over the results with her.

## 2024-02-19 NOTE — Telephone Encounter (Signed)
 Patient called and has covid. "She wanted to know is there any way you can call and go over the nerve study results. CB#[phone]

## 2024-02-21 ENCOUNTER — Ambulatory Visit

## 2024-02-22 ENCOUNTER — Ambulatory Visit: Admitting: Physician Assistant

## 2024-02-22 ENCOUNTER — Ambulatory Visit

## 2024-02-22 DIAGNOSIS — M5441 Lumbago with sciatica, right side: Secondary | ICD-10-CM

## 2024-02-22 DIAGNOSIS — G5602 Carpal tunnel syndrome, left upper limb: Secondary | ICD-10-CM | POA: Diagnosis not present

## 2024-02-22 DIAGNOSIS — G8929 Other chronic pain: Secondary | ICD-10-CM

## 2024-02-22 DIAGNOSIS — M5442 Lumbago with sciatica, left side: Secondary | ICD-10-CM | POA: Diagnosis not present

## 2024-02-22 NOTE — Progress Notes (Signed)
 Patient is 3-1/2 months status post C4-6 ACDF.  She still has intermittent neck pain, but is overall improved.  She has been having some numbness and tingling in her left hand, but had an EMG completed which showed moderate or severe carpal tunnel syndrome.  She has been using a brace to help with her symptoms.  I discussed with her that we are happy to have patient undergo ultrasound-guided carpal tunnel release if she was interested.  She has been using gabapentin  at night to help with her neuropathic pain however she states that it makes her extremely drowsy and she cannot take it during the day.  She has been going to occupational therapy which she feels as though it is helping quite a bit with increasing the strength in her hands.  She is also having back pain which is longstanding.  We discussed over-the-counter medications as well as her beginning physical therapy for this.  A referral was sent.  Lastly, at her last appointment she was concerned about a palpable small tissue mass on examination.  I obtained an MRI of her right humerus with and without contrast.  This resulted today which showed the possibility of a lipoma.  No evidence of concerning mass.  Advised patient to follow-up with us  on an as-needed basis.  Happy to answer any questions or address concerns.   EXAM: MRI OF THE RIGHT HUMERUS WITHOUT AND WITH CONTRAST    IMPRESSION: 1. No specific abnormality or focal lesion of the underlying soft tissues is identified in the vicinity of the patient's reported palpable abnormalities, although the possibility of lipoma in the subcutaneous adipose tissues cannot be ruled out. 2. Probable infraspinatus tendinopathy.     Electronically Signed   By: Freida Jes M.D.   On: 02/22/2024 09:01  This visit was performed via telephone.  Patient location: home Provider location: office  I spent a total of 20 minutes non-face-to-face activities for this visit on the date of this  encounter including review of current clinical condition and response to treatment.  The patient is aware of and accepts the limits of this telehealth visit.

## 2024-02-26 ENCOUNTER — Ambulatory Visit: Admitting: Occupational Therapy

## 2024-02-26 DIAGNOSIS — M79642 Pain in left hand: Secondary | ICD-10-CM | POA: Diagnosis not present

## 2024-02-26 DIAGNOSIS — M1812 Unilateral primary osteoarthritis of first carpometacarpal joint, left hand: Secondary | ICD-10-CM | POA: Diagnosis not present

## 2024-02-26 DIAGNOSIS — M6281 Muscle weakness (generalized): Secondary | ICD-10-CM

## 2024-02-26 DIAGNOSIS — R278 Other lack of coordination: Secondary | ICD-10-CM | POA: Diagnosis not present
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
## 2024-02-28 ENCOUNTER — Ambulatory Visit

## 2024-02-29 ENCOUNTER — Telehealth: Payer: Self-pay | Admitting: Orthopedic Surgery

## 2024-02-29 NOTE — Telephone Encounter (Signed)
 Called and LVM for patient She is scheduled for 330 Tuesday PM She was told to call back to confirm if this appointment

## 2024-02-29 NOTE — Telephone Encounter (Signed)
 Patient called and she was out for covid but needs to come in and discuss surgery Per Ans.CB#[phone]

## 2024-03-04 ENCOUNTER — Ambulatory Visit (INDEPENDENT_AMBULATORY_CARE_PROVIDER_SITE_OTHER): Admitting: Orthopedic Surgery

## 2024-03-04 ENCOUNTER — Ambulatory Visit

## 2024-03-04 DIAGNOSIS — M79642 Pain in left hand: Secondary | ICD-10-CM | POA: Diagnosis not present

## 2024-03-04 DIAGNOSIS — M1812 Unilateral primary osteoarthritis of first carpometacarpal joint, left hand: Secondary | ICD-10-CM

## 2024-03-04 DIAGNOSIS — R278 Other lack of coordination: Secondary | ICD-10-CM | POA: Diagnosis not present

## 2024-03-04 DIAGNOSIS — G5602 Carpal tunnel syndrome, left upper limb: Secondary | ICD-10-CM

## 2024-03-04 DIAGNOSIS — M6281 Muscle weakness (generalized): Secondary | ICD-10-CM
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
## 2024-03-05 ENCOUNTER — Telehealth: Payer: Self-pay | Admitting: Orthopedic Surgery

## 2024-03-05 DIAGNOSIS — F909 Attention-deficit hyperactivity disorder, unspecified type: Secondary | ICD-10-CM | POA: Diagnosis not present

## 2024-03-05 DIAGNOSIS — J45909 Unspecified asthma, uncomplicated: Secondary | ICD-10-CM | POA: Diagnosis not present

## 2024-03-05 NOTE — Telephone Encounter (Signed)
 [employer] forms received. Prepay letter/auth mailed to patient.

## 2024-03-06 ENCOUNTER — Ambulatory Visit

## 2024-03-10 DIAGNOSIS — M5010 Cervical disc disorder with radiculopathy, unspecified cervical region: Secondary | ICD-10-CM | POA: Diagnosis not present

## 2024-03-10 DIAGNOSIS — M47812 Spondylosis without myelopathy or radiculopathy, cervical region: Secondary | ICD-10-CM | POA: Diagnosis not present

## 2024-03-10 DIAGNOSIS — R519 Headache, unspecified: Secondary | ICD-10-CM | POA: Diagnosis not present

## 2024-03-10 DIAGNOSIS — G894 Chronic pain syndrome: Secondary | ICD-10-CM | POA: Diagnosis not present

## 2024-03-10 DIAGNOSIS — Z981 Arthrodesis status: Secondary | ICD-10-CM | POA: Diagnosis not present

## 2024-03-10 DIAGNOSIS — M791 Myalgia, unspecified site: Secondary | ICD-10-CM | POA: Diagnosis not present

## 2024-03-10 DIAGNOSIS — K5903 Drug induced constipation: Secondary | ICD-10-CM | POA: Diagnosis not present

## 2024-03-10 DIAGNOSIS — Z79891 Long term (current) use of opiate analgesic: Secondary | ICD-10-CM | POA: Diagnosis not present

## 2024-03-10 DIAGNOSIS — M1990 Unspecified osteoarthritis, unspecified site: Secondary | ICD-10-CM | POA: Diagnosis not present

## 2024-03-11 ENCOUNTER — Telehealth: Payer: Self-pay | Admitting: Neurosurgery

## 2024-03-11 ENCOUNTER — Other Ambulatory Visit: Payer: Self-pay | Admitting: Primary Care

## 2024-03-11 ENCOUNTER — Ambulatory Visit

## 2024-03-11 DIAGNOSIS — J4520 Mild intermittent asthma, uncomplicated: Secondary | ICD-10-CM

## 2024-03-11 NOTE — Telephone Encounter (Signed)
 She was released to follow up prn at last visit. I sent a refill of celebrex  to her pharmacy, but please let her know that she will need to get further refills from her PCP.

## 2024-03-13 ENCOUNTER — Ambulatory Visit

## 2024-03-18 ENCOUNTER — Ambulatory Visit

## 2024-03-20 ENCOUNTER — Ambulatory Visit

## 2024-03-25 ENCOUNTER — Telehealth: Payer: Self-pay | Admitting: Orthopedic Surgery

## 2024-03-25 ENCOUNTER — Ambulatory Visit

## 2024-03-25 NOTE — Telephone Encounter (Signed)
 Received vm from patient stating her [employer] forms was not received when faxed on 6/13. IC,lmvm adivsed was faxed on 6/13 and will and did refax [phone] with confirmation

## 2024-03-26 ENCOUNTER — Telehealth: Payer: Self-pay
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
## 2024-03-27 ENCOUNTER — Ambulatory Visit

## 2024-03-27 NOTE — Telephone Encounter (Signed)
 She hasn't had surgery yet. She is scheduled for left thumb CMC arthroplasty and left open carpal tunnel release. On 7/11. Ok to write the no use of hand note early?

## 2024-03-27 NOTE — Telephone Encounter (Signed)
 Note in chart

## 2024-03-28 ENCOUNTER — Telehealth: Payer: Self-pay | Admitting: Orthopedic Surgery
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
## 2024-04-10 ENCOUNTER — Other Ambulatory Visit: Payer: Self-pay

## 2024-04-10 ENCOUNTER — Encounter (HOSPITAL_BASED_OUTPATIENT_CLINIC_OR_DEPARTMENT_OTHER): Payer: Self-pay | Admitting: Orthopedic Surgery

## 2024-04-15 ENCOUNTER — Other Ambulatory Visit: Payer: Self-pay

## 2024-04-15 DIAGNOSIS — M1812 Unilateral primary osteoarthritis of first carpometacarpal joint, left hand: Secondary | ICD-10-CM

## 2024-04-16 DIAGNOSIS — G5602 Carpal tunnel syndrome, left upper limb: Secondary | ICD-10-CM | POA: Diagnosis not present

## 2024-04-16 DIAGNOSIS — K5903 Drug induced constipation: Secondary | ICD-10-CM | POA: Diagnosis not present

## 2024-04-16 DIAGNOSIS — G894 Chronic pain syndrome: Secondary | ICD-10-CM | POA: Diagnosis not present

## 2024-04-16 DIAGNOSIS — M47812 Spondylosis without myelopathy or radiculopathy, cervical region: Secondary | ICD-10-CM | POA: Diagnosis not present

## 2024-04-16 DIAGNOSIS — M1990 Unspecified osteoarthritis, unspecified site: Secondary | ICD-10-CM | POA: Diagnosis not present

## 2024-04-16 DIAGNOSIS — M5010 Cervical disc disorder with radiculopathy, unspecified cervical region: Secondary | ICD-10-CM | POA: Diagnosis not present

## 2024-04-16 DIAGNOSIS — Z981 Arthrodesis status: Secondary | ICD-10-CM | POA: Diagnosis not present

## 2024-04-16 DIAGNOSIS — R519 Headache, unspecified: Secondary | ICD-10-CM | POA: Diagnosis not present

## 2024-04-16 DIAGNOSIS — Z79891 Long term (current) use of opiate analgesic: Secondary | ICD-10-CM | POA: Diagnosis not present

## 2024-04-16 DIAGNOSIS — M791 Myalgia, unspecified site: Secondary | ICD-10-CM | POA: Diagnosis not present

## 2024-04-18 ENCOUNTER — Ambulatory Visit (HOSPITAL_BASED_OUTPATIENT_CLINIC_OR_DEPARTMENT_OTHER)
Admission: RE | Admit: 2024-04-18 | Discharge: 2024-04-18 | Disposition: A | Discharge status: Home / Self Care | Attending: Orthopedic Surgery | Admitting: Orthopedic Surgery

## 2024-04-18 ENCOUNTER — Other Ambulatory Visit: Payer: Self-pay

## 2024-04-18 ENCOUNTER — Encounter (HOSPITAL_BASED_OUTPATIENT_CLINIC_OR_DEPARTMENT_OTHER)
Admission: RE | Disposition: A | Discharge status: Home / Self Care | Payer: Self-pay | Source: Home / Self Care | Attending: Orthopedic Surgery

## 2024-04-18 ENCOUNTER — Ambulatory Visit (HOSPITAL_BASED_OUTPATIENT_CLINIC_OR_DEPARTMENT_OTHER): Admitting: Anesthesiology

## 2024-04-18 ENCOUNTER — Ambulatory Visit (HOSPITAL_BASED_OUTPATIENT_CLINIC_OR_DEPARTMENT_OTHER)

## 2024-04-18 ENCOUNTER — Encounter (HOSPITAL_BASED_OUTPATIENT_CLINIC_OR_DEPARTMENT_OTHER): Payer: Self-pay | Admitting: Orthopedic Surgery

## 2024-04-18 DIAGNOSIS — G5602 Carpal tunnel syndrome, left upper limb: Secondary | ICD-10-CM | POA: Diagnosis not present

## 2024-04-18 DIAGNOSIS — M1812 Unilateral primary osteoarthritis of first carpometacarpal joint, left hand: Secondary | ICD-10-CM

## 2024-04-18 DIAGNOSIS — J45909 Unspecified asthma, uncomplicated: Secondary | ICD-10-CM | POA: Diagnosis not present

## 2024-04-18 DIAGNOSIS — Z01818 Encounter for other preprocedural examination: Secondary | ICD-10-CM

## 2024-04-18 HISTORY — PX: CARPAL TUNNEL RELEASE: SHX101

## 2024-04-18 HISTORY — PX: FINGER ARTHROPLASTY: SHX5017

## 2024-04-18 SURGERY — ARTHROPLASTY, FINGER
Anesthesia: Monitor Anesthesia Care | Site: Thumb | Laterality: Left

## 2024-04-18 MED ORDER — ONDANSETRON HCL 4 MG/2ML IJ SOLN: 4.0000 mg | Freq: Once | INTRAMUSCULAR | Status: DC | PRN

## 2024-04-18 MED ORDER — FENTANYL CITRATE (PF) 100 MCG/2ML IJ SOLN
INTRAMUSCULAR | Status: AC
  Filled 2024-04-18: qty 2

## 2024-04-18 MED ORDER — MIDAZOLAM HCL 2 MG/2ML IJ SOLN
2.0000 mg | Freq: Once | INTRAMUSCULAR | Status: AC
  Administered 2024-04-18: 2 mg via INTRAVENOUS

## 2024-04-18 MED ORDER — FENTANYL CITRATE (PF) 100 MCG/2ML IJ SOLN
INTRAMUSCULAR | Status: DC | PRN
  Administered 2024-04-18: 25 ug via INTRAVENOUS

## 2024-04-18 MED ORDER — CEFAZOLIN SODIUM-DEXTROSE 2-4 GM/100ML-% IV SOLN
INTRAVENOUS | Status: AC
  Filled 2024-04-18: qty 100

## 2024-04-18 MED ORDER — CEFAZOLIN SODIUM-DEXTROSE 2-4 GM/100ML-% IV SOLN
2.0000 g | INTRAVENOUS | Status: AC
  Administered 2024-04-18: 2 g via INTRAVENOUS

## 2024-04-18 MED ORDER — LIDOCAINE-EPINEPHRINE (PF) 1 %-1:200000 IJ SOLN
INTRAMUSCULAR | Status: DC | PRN
  Administered 2024-04-18: 10 mL via INTRADERMAL

## 2024-04-18 MED ORDER — ONDANSETRON HCL 4 MG/2ML IJ SOLN
INTRAMUSCULAR | Status: AC
  Filled 2024-04-18: qty 2

## 2024-04-18 MED ORDER — OXYCODONE HCL 5 MG/5ML PO SOLN: 5.0000 mg | Freq: Once | ORAL | Status: DC | PRN

## 2024-04-18 MED ORDER — LIDOCAINE 2% (20 MG/ML) 5 ML SYRINGE
INTRAMUSCULAR | Status: AC
Start: 2024-04-18 — End: 2024-04-18
  Filled 2024-04-18: qty 5

## 2024-04-18 MED ORDER — 0.9 % SODIUM CHLORIDE (POUR BTL) OPTIME
TOPICAL | Status: DC | PRN
Start: 2024-04-18 — End: 2024-04-18
  Administered 2024-04-18: 200 mL

## 2024-04-18 MED ORDER — ACETAMINOPHEN 10 MG/ML IV SOLN: 1000.0000 mg | Freq: Once | INTRAVENOUS | Status: DC | PRN

## 2024-04-18 MED ORDER — DEXAMETHASONE SODIUM PHOSPHATE 10 MG/ML IJ SOLN
INTRAMUSCULAR | Status: DC | PRN
  Administered 2024-04-18: 5 mg via INTRAVENOUS

## 2024-04-18 MED ORDER — PROPOFOL 10 MG/ML IV BOLUS
INTRAVENOUS | Status: DC | PRN
  Administered 2024-04-18 (×4): 10 mg via INTRAVENOUS

## 2024-04-18 MED ORDER — FENTANYL CITRATE (PF) 100 MCG/2ML IJ SOLN
100.0000 ug | Freq: Once | INTRAMUSCULAR | Status: AC
  Administered 2024-04-18: 50 ug via INTRAVENOUS

## 2024-04-18 MED ORDER — LIDOCAINE HCL 1 % IJ SOLN
INTRAMUSCULAR | Status: DC | PRN
  Administered 2024-04-18: 40 mg via INTRADERMAL

## 2024-04-18 MED ORDER — MIDAZOLAM HCL 2 MG/2ML IJ SOLN
INTRAMUSCULAR | Status: AC
  Filled 2024-04-18: qty 2

## 2024-04-18 MED ORDER — DEXAMETHASONE SODIUM PHOSPHATE 10 MG/ML IJ SOLN
INTRAMUSCULAR | Status: AC
  Filled 2024-04-18: qty 1

## 2024-04-18 MED ORDER — FENTANYL CITRATE (PF) 100 MCG/2ML IJ SOLN: 25.0000 ug | INTRAMUSCULAR | Status: DC | PRN

## 2024-04-18 MED ORDER — OXYCODONE HCL 5 MG PO TABS: 5.0000 mg | ORAL_TABLET | Freq: Once | ORAL | Status: DC | PRN

## 2024-04-18 MED ORDER — LACTATED RINGERS IV SOLN
INTRAVENOUS | Status: DC
Start: 2024-04-18 — End: 2024-04-18

## 2024-04-18 MED ORDER — PROPOFOL 500 MG/50ML IV EMUL
INTRAVENOUS | Status: DC | PRN
  Administered 2024-04-18: 100 ug/kg/min via INTRAVENOUS

## 2024-04-18 MED ORDER — BUPIVACAINE-EPINEPHRINE (PF) 0.5% -1:200000 IJ SOLN
INTRAMUSCULAR | Status: DC | PRN
  Administered 2024-04-18: 30 mL

## 2024-04-18 MED ORDER — ONDANSETRON HCL 4 MG/2ML IJ SOLN
INTRAMUSCULAR | Status: DC | PRN
  Administered 2024-04-18: 4 mg via INTRAVENOUS

## 2024-04-18 MED ORDER — PROPOFOL 1000 MG/100ML IV EMUL
INTRAVENOUS | Status: AC
  Filled 2024-04-18: qty 100

## 2024-04-18 SURGICAL SUPPLY — 59 items
ANCHOR REPAIR HAND WRIST (Anchor) IMPLANT
BLADE MINI RND TIP GREEN BEAV (BLADE) ×2 IMPLANT
BLADE SURG 15 STRL LF DISP TIS (BLADE) ×4 IMPLANT
BNDG COHESIVE 4X5 TAN STRL LF (GAUZE/BANDAGES/DRESSINGS) ×2 IMPLANT
BNDG COMPR ESMARK 4X3 LF (GAUZE/BANDAGES/DRESSINGS) ×2 IMPLANT
BNDG ELASTIC 4INX 5YD STR LF (GAUZE/BANDAGES/DRESSINGS) ×4 IMPLANT
BNDG GAUZE DERMACEA FLUFF 4 (GAUZE/BANDAGES/DRESSINGS) ×2 IMPLANT
BUR EGG 3PK/BX (BURR) IMPLANT
BUR OVAL CARBIDE 4.0 (BURR) IMPLANT
BUR PEAR (BURR) IMPLANT
CHLORAPREP W/TINT 26 (MISCELLANEOUS) ×2 IMPLANT
CORD BIPOLAR FORCEPS 12FT (ELECTRODE) ×2 IMPLANT
COVER BACK TABLE 60X90IN (DRAPES) ×2 IMPLANT
CUFF TOURN SGL QUICK 18X4 (TOURNIQUET CUFF) IMPLANT
CUFF TRNQT CYL 24X4X16.5-23 (TOURNIQUET CUFF) ×2 IMPLANT
DRAPE HAND 77X146 (DRAPES) ×2 IMPLANT
DRAPE OEC MINIVIEW 54X84 (DRAPES) ×2 IMPLANT
DRAPE SURG 17X23 STRL (DRAPES) ×2 IMPLANT
GAUZE PAD ABD 8X10 STRL (GAUZE/BANDAGES/DRESSINGS) ×2 IMPLANT
GAUZE SPONGE 4X4 12PLY STRL (GAUZE/BANDAGES/DRESSINGS) ×2 IMPLANT
GAUZE STRETCH 2X75IN STRL (MISCELLANEOUS) ×2 IMPLANT
GAUZE XEROFORM 1X8 LF (GAUZE/BANDAGES/DRESSINGS) ×2 IMPLANT
GLOVE BIO SURGEON STRL SZ7.5 (GLOVE) ×4 IMPLANT
GLOVE BIOGEL PI IND STRL 7.5 (GLOVE) ×4 IMPLANT
GOWN STRL REUS W/ TWL LRG LVL3 (GOWN DISPOSABLE) ×4 IMPLANT
GOWN STRL REUS W/TWL XL LVL3 (GOWN DISPOSABLE) ×2 IMPLANT
GOWN STRL SURGICAL XL XLNG (GOWN DISPOSABLE) ×4 IMPLANT
GUIDEWIRE TROC TIP .062X9.25 (WIRE) IMPLANT
MANIFOLD NEPTUNE II (INSTRUMENTS) ×2 IMPLANT
NDL HYPO 25X1 1.5 SAFETY (NEEDLE) IMPLANT
NDL HYPO 25X5/8 SAFETYGLIDE (NEEDLE) IMPLANT
NDL KEITH (NEEDLE) IMPLANT
NEEDLE HYPO 25X1 1.5 SAFETY (NEEDLE) IMPLANT
NEEDLE HYPO 25X5/8 SAFETYGLIDE (NEEDLE) IMPLANT
NEEDLE KEITH (NEEDLE) IMPLANT
NS IRRIG 1000ML POUR BTL (IV SOLUTION) IMPLANT
PACK BASIN DAY SURGERY FS (CUSTOM PROCEDURE TRAY) ×2 IMPLANT
PAD CAST 4YDX4 CTTN HI CHSV (CAST SUPPLIES) ×4 IMPLANT
SHEET MEDIUM DRAPE 40X70 STRL (DRAPES) ×2 IMPLANT
SLEEVE SCD COMPRESS KNEE MED (STOCKING) ×2 IMPLANT
SPIKE FLUID TRANSFER (MISCELLANEOUS) IMPLANT
SPLINT FIBERGLASS 4X30 (CAST SUPPLIES) IMPLANT
SPLINT PLASTER CAST XFAST 4X15 (CAST SUPPLIES) ×20 IMPLANT
SPONGE SURGIFOAM ABS GEL 12-7 (HEMOSTASIS) IMPLANT
STOCKINETTE IMPERVIOUS 9X36 MD (GAUZE/BANDAGES/DRESSINGS) ×2 IMPLANT
SUCTION TUBE FRAZIER 10FR DISP (SUCTIONS) IMPLANT
SUT ETHILON 4 0 PS 2 18 (SUTURE) ×2 IMPLANT
SUT MNCRL AB 3-0 PS2 27 (SUTURE) ×2 IMPLANT
SUT PDS AB 4-0 RB1 27 (SUTURE) IMPLANT
SUT SILK 4 0 PS 2 (SUTURE) IMPLANT
SUT VIC AB 3-0 FS2 27 (SUTURE) IMPLANT
SUT VIC AB 3-0 SH 27X BRD (SUTURE) IMPLANT
SUT VIC AB 4-0 PS2 18 (SUTURE) IMPLANT
SUTURE 0 FIBERLP 38 BLU TPR ND (SUTURE) IMPLANT
SYR BULB EAR ULCER 3OZ GRN STR (SYRINGE) ×4 IMPLANT
SYR CONTROL 10ML LL (SYRINGE) ×2 IMPLANT
TOWEL GREEN STERILE FF (TOWEL DISPOSABLE) ×4 IMPLANT
TUBE CONNECTING 20X1/4 (TUBING) IMPLANT
UNDERPAD 30X36 HEAVY ABSORB (UNDERPADS AND DIAPERS) ×2 IMPLANT

## 2024-04-18 NOTE — Anesthesia Procedure Notes (Signed)
 Anesthesia Regional Block: Axillary brachial plexus block   Pre-Anesthetic Checklist: , timeout performed,  Correct Patient, Correct Site, Correct Laterality,  Correct Procedure, Correct Position, site marked,  Risks and benefits discussed,  Surgical consent,  Pre-op evaluation,  At surgeon's request and post-op pain management  Laterality: Left  Prep: Maximum Sterile Barrier Precautions used, chloraprep       Needles:  Injection technique: Single-shot  Needle Type: Echogenic Needle     Needle Length: 5cm  Needle Gauge: 22     Additional Needles:   Procedures:,,,, ultrasound used (permanent image in chart),,    Narrative:  Start time: 04/18/2024 7:00 AM End time: 04/18/2024 7:10 AM Injection made incrementally with aspirations every 5 mL.  Performed by: Personally  Anesthesiologist: Keneth Lynwood POUR, MD

## 2024-04-18 NOTE — Discharge Instructions (Addendum)
 Hand Surgery Postop Instructions   Dressings: Maintain postoperative dressing until orthopedic follow-up.  Keep operative site clean and dry until orthopedic follow-up.  Wound Care: Keep your hand elevated above the level of your heart.  Do not allow it to dangle by your side. Moving your fingers is advised to stimulate circulation but will depend on the site of your surgery.  If you have a splint applied, your doctor will advise you regarding movement.  Activity: Do not drive or operate machinery until clearance given from physician. No heavy lifting with operative extremity.  Diet:  Drink liquids today or eat a light diet.  You may resume a regular diet tomorrow.    General expectations: Take prescribed medication if given, transition to over-the-counter medication as quickly as possible. Fingers may become slightly swollen.  Call your doctor if any of the following occur: Severe pain not relieved by pain medication. Elevated temperature. Dressing soaked with blood. Inability to move fingers. White or bluish color to fingers.   Per [facility] clinic policy, our goal is ensure optimal postoperative pain control with a multimodal pain management strategy. For all [facility] patients, our goal is to wean post-operative narcotic medications by 6 weeks post-operatively. If this is not possible due to utilization of pain medication prior to surgery, your [facility] doctor will support your acute post-operative pain control for the first 6 weeks postoperatively, with a plan to transition you back to your primary pain team following that. Cyndia Skeeters will work to ensure a Therapist, occupational.  Anshul Trevor Mace, M.D. Hand Surgery [facility] Anesthesia Blocks  1. You may not be able to move or feel the "blocked" extremity after a regional anesthetic block. This may last may last from 3-48 hours after placement, but it will go away. The length of time depends on the  medication injected and your individual response to the medication. As the nerves start to wake up, you may experience tingling as the movement and feeling returns to your extremity. If the numbness and inability to move your extremity has not gone away after 48 hours, please call your surgeon.   2. The extremity that is blocked will need to be protected until the numbness is gone and the strength has returned. Because you cannot feel it, you will need to take extra care to avoid injury. Because it may be weak, you may have difficulty moving it or using it. You may not know what position it is in without looking at it while the block is in effect.  3. For blocks in the legs and feet, returning to weight bearing and walking needs to be done carefully. You will need to wait until the numbness is entirely gone and the strength has returned. You should be able to move your leg and foot normally before you try and bear weight or walk. You will need someone to be with you when you first try to ensure you do not fall and possibly risk injury.  4. Bruising and tenderness at the needle site are common side effects and will resolve in a few days.  5. Persistent numbness or new problems with movement should be communicated to the surgeon or the [facility] [phone] [facility] [phone]).  Post Anesthesia Home Care Instructions  Activity: Get plenty of rest for the remainder of the day. A responsible individual must stay with you for 24 hours following the procedure.  For the next 24 hours, DO NOT: -  Drive a car -Advertising copywriter -Drink alcoholic beverages -Take any medication unless instructed by your physician -Make any legal decisions or sign important papers.  Meals: Start with liquid foods such as gelatin or soup. Progress to regular foods as tolerated. Avoid greasy, spicy, heavy foods. If nausea and/or vomiting occur, drink only clear liquids until the nausea  and/or vomiting subsides. Call your physician if vomiting continues.  Special Instructions/Symptoms: Your throat may feel dry or sore from the anesthesia or the breathing tube placed in your throat during surgery. If this causes discomfort, gargle with warm salt water. The discomfort should disappear within 24 hours.  If you had a scopolamine patch placed behind your ear for the management of post- operative nausea and/or vomiting:  1. The medication in the patch is effective for 72 hours, after which it should be removed.  Wrap patch in a tissue and discard in the trash. Wash hands thoroughly with soap and water. 2. You may remove the patch earlier than 72 hours if you experience unpleasant side effects which may include dry mouth, dizziness or visual disturbances. 3. Avoid touching the patch. Wash your hands with soap and water after contact with the patch.

## 2024-04-18 NOTE — Op Note (Signed)
 NAME: Beverly Wright MEDICAL RECORD NO: [id number] DATE OF BIRTH: [birth year] FACILITY: [facility] LOCATION:  [facility] PHYSICIAN: GILDARDO ALDERTON, MD   OPERATIVE REPORT   DATE OF PROCEDURE: 04/18/24    PREOPERATIVE DIAGNOSIS: Left thumb carpometacarpal arthritis with associated carpal tunnel syndrome   POSTOPERATIVE DIAGNOSIS: Left thumb carpometacarpal arthritis with associated carpal tunnel syndrome   PROCEDURE: Left thumb carpometacarpal arthroplasty with internal brace Left open carpal tunnel release   SURGEON:  GILDARDO ALDERTON, M.D.   ASSISTANT: Almeda Rummer, PA   ANESTHESIA:  Regional with sedation   INTRAVENOUS FLUIDS:  Per anesthesia flow sheet.   ESTIMATED BLOOD LOSS:  Minimal.   COMPLICATIONS:  None.   SPECIMENS:  none   TOURNIQUET TIME:    Total Tourniquet Time Documented: Upper Arm (Left) - 40 minutes Total: Upper Arm (Left) - 40 minutes    DISPOSITION:  Stable to PACU.   INDICATIONS: 53 year old female with clinical and radiographic history of left thumb CMC arthritis that was refractory to conservative care.  Patient was also found to have clinical electrodiagnostic evidence of left-sided carpal tunnel syndrome in association.  Patient was indicated for left thumb carpometacarpal arthroplasty with associated open carpal tunnel release.  Risks and benefits of surgery were discussed including the risks of infection, bleeding, scarring, stiffness, nerve injury, vascular injury, tendon injury, need for subsequent operation, subsidence, recurrence.  She voiced understanding of these risks and elected to proceed.  OPERATIVE COURSE: Patient was seen and identified in the preoperative area and marked appropriately.  Surgical consent had been signed. Preoperative IV antibiotic prophylaxis was given. She was transferred to the operating room and placed in supine position with the Left Left upper extremity on an arm board.  Sedation was induced by the  anesthesiologist. A regional block had been performed by anesthesia in preoperative holding.    Left upper extremity was prepped and draped in normal sterile orthopedic fashion.  A surgical pause was performed between the surgeons, anesthesia, and operating room staff and all were in agreement as to the patient, procedure, and site of procedure.  Tourniquet was placed and padded appropriately to the left upper arm.  The arms exsanguinated the tourniquet was inflated to 250 mmHg.  We first began with the thumb [facility] arthroplasty.  A straight line incision was made over the dorsum of the left thumb CMC joint at the glabrous/nonglabrous junction.  Crossing branches of the radial sensory nerve were identified and carefully protected.  The radial artery was identified and protected.  The first extensor compartment was first released.  The tendons of the first extensor compartment were identified within the incisional site.  The dorsal most aspect of the tendon sheath was then released and carried down over the radial styloid.  Following release of the first extensor compartment, CMC arthrotomy was completed.  Thick capsular flaps were elevated both radially and ulnarly.  The trapezium was identified and confirmed with biplanar fluoroscopy.  This was carefully freed and dissected, then removed utilizing a rongeur.  Care was taken to avoid injury to the underlying flexor carpi radialis tendon.  Following complete trapeziectomy, the scaphoid trapezoid joint was inspected.  There was noted to be cartilage loss and degenerative changes at the joint involving the articular surface of the trapezoid, consequently a partial excision of the trapezoid was performed.  At this point, the Arthrex internal brace was utilized.  Base of the index metacarpal was identified utilizing biplanar fluoroscopy, the K wire was inserted into the nonarticular portion followed by  drill and the first suture anchor.  Biplanar fluoroscopy was  utilized to confirm appropriate positioning of the wire prior to anchor placement.  Stability was noted to this suture anchor.  Subsequently, the thumb metacarpal base was identified followed by placement of the additional suture anchor and suspension of the internal brace.  Thumb was held in gently abducted position for internal brace placement.  Biplanar fluoroscopy was utilized to confirm appropriate thumb metacarpal positioning, no significant subsidence was noted with gentle stress testing.  At this stage in the procedure, the FCR tendon was identified and the depth of the wound.  The APL tendons were identified at their distal extent overlying the base the metacarpal.  An 0 FiberWire was utilized to create a tendon transfer between the APL tendon slips and the FCR with traction on the thumb.  Excellent stability was noted.  The void created by the trapeziectomy was filled with a Gelfoam.  The capsular flaps were repaired utilizing 3-0 Vicryl suture in figure-of-eight fashion.  We then turned our attention to the carpal tunnel.  Longitudinal incision was designed in the thenar crease in line with the radial border of the ring finger, from intersection of Kaplan's cardinal line down to level of the distal wrist crease. Incision was carried down utilizing 15 blade. Blunt dissection was performed, palmar fascia was identified and incised sharply utilizing a Beaver blade. Careful dissection was performed down, thenar musculature was bluntly elevated and the transcarpal ligament was identified.  A Beaver blade was then utilized to divide the transcarpal ligament in a distal to proximal fashion.  Fat surrounding the palmar arch was encountered to confirm appropriate distal release.  At the level of the wrist crease, skin flaps were elevated to allow for release of the proximal portion of transverse carpal ligament as well as the antebrachial fascia into the forearm. Appropriate decompression was noted of the  median nerve, care was taken to protect the nerve in its entirety throughout. Once we were satisfied with our proximal and distal release, tourniquet was deflated and bipolar electrocautery was utilized for hemostasis.   The tourniquet was deflated at 40 minutes.  Fingertips were pink with brisk capillary refill after deflation of tourniquet.  Copious irrigation was performed.  Incision for the thumb CMC was closed in layers utilizing 3-0 Vicryl for the subcutaneous tissue and a 4-0 nylon suture for skin surface.  4-0 nylon was utilized for the carpal tunnel incision as well.  Sterile dressings were provided followed by application of a thumb spica splint utilizing plaster.  The operative drapes were broken down.  Sling was applied.  The patient was awoken from anesthesia safely and taken to PACU in stable condition.  I will see her back in the office in 2 weeks for postoperative followup.    Post-operative plan: The patient will recover in the post-anesthesia care unit and then be discharged home.  The patient will be non weight bearing on the left upper extremity in a thumb spica splint.   I will see the patient back in the office in 2 weeks for postoperative followup.  Discharge instructions were provided for appropriate dressing maintenance and pain control.  Earlean Fidalgo, MD Electronically signed, 04/18/24

## 2024-04-18 NOTE — Progress Notes (Signed)
Assisted Dr Ace Gins with left, axillary, ultrasound guided block. Side rails up, monitors on throughout procedure. See vital signs in flow sheet. Tolerated Procedure well.

## 2024-04-18 NOTE — H&P (Signed)
 Beverly Wright - 53 y.o. female MRN [id number]  Date of birth: 1971/09/21   Office Visit Note: Visit Date: 03/04/2024 PCP: Gretta Comer POUR, NP Referred by: Gretta Comer POUR, NP   Subjective: No chief complaint on file.   HPI: Beverly Wright is a pleasant 53 y.o. female with left thumb CMC arthritis and associated left carpal tunnel syndrome.  Plan for L thumb CMC arthoplasty and L OCTR.      Assessment & Plan: Visit Diagnoses:  1. Arthritis of carpometacarpal (CMC) joint of left thumb   2. Carpal tunnel syndrome, left upper limb         Plan: Extensive discussion had been had with the patient regarding her ongoing left thumb CMC osteoarthritis.  X-rays were reviewed in detail today which do show significant degenerative change at the left thumb [facility] articulation.  We discussed treatment modalities ranging from conservative to surgical.  From a conservative standpoint we discussed bracing, injections, anti-inflammatory medications and activity modification.  From a surgical standpoint we discussed [facility] arthroplasty, risks and benefits as well as the postoperative protocol.   At this juncture, given the severity of symptoms that have remained refractory to conservative care, patient would like to move forward with surgical intervention.     Risks and benefits of the procedure were discussed, risks including but not limited to infection, bleeding, scarring, stiffness, nerve injury, tendon injury, vascular injury, hardware complication, recurrence of symptoms and need for subsequent operation.  We also discussed the specifics of the postoperative protocol and the appropriate timeline.  Patient expressed understanding.   In addition, we discussed the ongoing left sided carpal tunnel syndrome that is refractory to conservative care.  Patient has both clinical and electrodiagnostic evidence to confirm this diagnosis.  At this juncture, she is indicated for left open carpal tunnel  release to be performed in conjunction with the left thumb CMC arthroplasty.     Risks include but not limited to infection, bleeding, scarring, stiffness, nerve injury or vascular, tendon injury, risk of recurrence and need for subsequent operation were all discussed in detail.  Patient consented understanding the above.     We will proceed with surgery as planned.    Gen: Well-appearing, in no acute distress; non-toxic CV: Regular Rate. Well-perfused. Warm.  Resp: Breathing unlabored on room air; no wheezing. Psych: Fluid speech in conversation; appropriate affect; normal thought process   Ortho Exam General: Patient is well appearing and in no distress.    Skin and Muscle: No skin changes are apparent to upper extremities.  Muscle bulk and contour normal, no signs of atrophy.   Moderate swelling throughout the dorsal aspect of the left hand.   Range of Motion and Palpation Tests: Mobility is full about the elbows with flexion and extension.  Forearm supination and pronation are 85/85 bilaterally.  Wrist flexion/extension is 75/65 bilaterally.  Digital flexion is slightly limited at the left hand, approximately 0.5 cm from distal palmar crease actively with composite fist, improved passively with notable pain dorsally.   No cords or nodules are palpated.  No triggering is observed.     Significant tenderness over the left thumb CMC articulation is observed, positive grind for pain, positive crepitus.  MP hyperextension negative.    Finklestein test mildly positive   Neurologic, Vascular, Motor: Sensation is diminished to light touch in the median nerve distribution left hand, positive Tinel's left carpal tunnel Phalen's mildly positive left, Derkan's compression positive left.   Fingers pink and well  perfused.  Capillary refill is brisk.

## 2024-04-18 NOTE — Anesthesia Preprocedure Evaluation (Signed)
 Anesthesia Evaluation  Patient identified by MRN, date of birth, ID band Patient awake    Reviewed: Allergy & Precautions, NPO status , Patient's Chart, lab work & pertinent test results, reviewed documented beta blocker date and time   History of Anesthesia Complications (+) PONV and history of anesthetic complications  Airway Mallampati: III  TM Distance: >3 FB     Dental no notable dental hx.    Pulmonary asthma , former smoker   breath sounds clear to auscultation       Cardiovascular (-) angina (-) CAD and (-) Past MI  Rhythm:Regular Rate:Normal     Neuro/Psych  Headaches PSYCHIATRIC DISORDERS       Neuromuscular disease    GI/Hepatic ,GERD  ,,(+) neg Cirrhosis        Endo/Other    Renal/GU Renal disease     Musculoskeletal   Abdominal   Peds  Hematology  (+) Blood dyscrasia, anemia   Anesthesia Other Findings   Reproductive/Obstetrics                              Anesthesia Physical Anesthesia Plan  ASA: 2  Anesthesia Plan: Regional and MAC   Post-op Pain Management: Regional block*   Induction: Intravenous  PONV Risk Score and Plan: 3 and Ondansetron , Dexamethasone  and Propofol  infusion  Airway Management Planned: Natural Airway and Simple Face Mask  Additional Equipment:   Intra-op Plan:   Post-operative Plan:   Informed Consent: I have reviewed the patients History and Physical, chart, labs and discussed the procedure including the risks, benefits and alternatives for the proposed anesthesia with the patient or authorized representative who has indicated his/her understanding and acceptance.     Dental advisory given  Plan Discussed with: CRNA  Anesthesia Plan Comments:         Anesthesia Quick Evaluation

## 2024-04-18 NOTE — Transfer of Care (Signed)
 Immediate Anesthesia Transfer of Care Note  Patient: Beverly Wright  Procedure(s) Performed: ARTHROPLASTY, FINGER (Left: Thumb) CARPAL TUNNEL RELEASE (Left: Hand)  Patient Location: PACU  Anesthesia Type:MAC  Level of Consciousness: awake, alert , oriented, and patient cooperative  Airway & Oxygen Therapy: Patient Spontanous Breathing and Patient connected to face mask oxygen  Post-op Assessment: Report given to RN and Post -op Vital signs reviewed and stable  Post vital signs: Reviewed and stable  Last Vitals:  Vitals Value Taken Time  BP 142/62 04/18/24 08:59  Temp    Pulse 65 04/18/24 09:00  Resp 12 04/18/24 09:00  SpO2 95 % 04/18/24 09:00  Vitals shown include unfiled device data.  Last Pain:  Vitals:   04/18/24 0644  TempSrc: Temporal  PainSc: 8       Patients Stated Pain Goal: 4 (04/18/24 9355)  Complications: No notable events documented.

## 2024-04-18 NOTE — Anesthesia Postprocedure Evaluation (Signed)
 Anesthesia Post Note  Patient: Janell Keeling  Procedure(s) Performed: ARTHROPLASTY, FINGER (Left: Thumb) CARPAL TUNNEL RELEASE (Left: Hand)     Patient location during evaluation: PACU Anesthesia Type: Regional Level of consciousness: awake and alert Pain management: pain level controlled Vital Signs Assessment: post-procedure vital signs reviewed and stable Respiratory status: spontaneous breathing, nonlabored ventilation, respiratory function stable and patient connected to nasal cannula oxygen Cardiovascular status: blood pressure returned to baseline and stable Postop Assessment: no apparent nausea or vomiting Anesthetic complications: no   No notable events documented.  Last Vitals:  Vitals:   04/18/24 0915 04/18/24 0949  BP: 132/77 124/78  Pulse: 71 64  Resp: 14 18  Temp:  (!) 36.2 C  SpO2: 96% 97%    Last Pain:  Vitals:   04/18/24 0949  TempSrc: Temporal  PainSc: 0-No pain                 Lynwood MARLA Cornea

## 2024-04-19 ENCOUNTER — Encounter (HOSPITAL_BASED_OUTPATIENT_CLINIC_OR_DEPARTMENT_OTHER): Payer: Self-pay | Admitting: Orthopedic Surgery

## 2024-04-21 ENCOUNTER — Ambulatory Visit (INDEPENDENT_AMBULATORY_CARE_PROVIDER_SITE_OTHER): Admitting: Orthopedic Surgery

## 2024-04-21 DIAGNOSIS — G5602 Carpal tunnel syndrome, left upper limb: Secondary | ICD-10-CM

## 2024-04-21 DIAGNOSIS — M1812 Unilateral primary osteoarthritis of first carpometacarpal joint, left hand: Secondary | ICD-10-CM

## 2024-04-21 NOTE — Progress Notes (Signed)
   Yuko Coventry - 53 y.o. female MRN [id number]  Date of birth: Feb 25, 1971  Office Visit Note: Visit Date: 04/21/2024 PCP: Gretta Comer POUR, NP Referred by: Gretta Comer POUR, NP  Subjective:  HPI: Taisa Deloria is a 53 y.o. female who presents today for follow up 3 days status post left thumb carpometacarpal arthroplasty with associated open carpal tunnel release.  She returns today for placement of a new splint, given ongoing swelling in this region, there has been increased discomfort. .  Pertinent ROS were reviewed with the patient and found to be negative unless otherwise specified above in HPI.   Assessment & Plan: Visit Diagnoses:  1. Arthritis of carpometacarpal (CMC) joint of left thumb   2. Carpal tunnel syndrome, left upper limb     Plan: She does have some ongoing swelling in the surgical region, the thumb spica brace was creating some pressure on the carpal tunnel incision.  Incision sites look clean dry and intact, there is no evidence of infection, no erythema or drainage.  Sterile dressings were applied and new splint was fabricated today, attention was paid to prevent any pressure on the carpal tunnel region.  She will return in approximate 2 weeks for her standard 2-week postoperative check for suture removal and fabrication of a removable orthosis at that time.  She expressed full understanding.  Follow-up: No follow-ups on file.   Meds & Orders: No orders of the defined types were placed in this encounter.  No orders of the defined types were placed in this encounter.    Procedures: No procedures performed       Objective:   Vital Signs: LMP 11/10/2016   Ortho Exam Left thumb CMC incision and carpal tunnel incision with sutures in place, skin edges well-approximated without erythema or drainage  Imaging: No results found.   Leonila Speranza Afton Alderton, M.D. [facility], Hand Surgery

## 2024-04-30 ENCOUNTER — Encounter: Payer: Self-pay | Admitting: Occupational Therapy

## 2024-04-30 ENCOUNTER — Ambulatory Visit: Attending: Orthopedic Surgery | Admitting: Occupational Therapy

## 2024-04-30 DIAGNOSIS — M1812 Unilateral primary osteoarthritis of first carpometacarpal joint, left hand: Secondary | ICD-10-CM | POA: Diagnosis present

## 2024-04-30 DIAGNOSIS — R278 Other lack of coordination: Secondary | ICD-10-CM | POA: Diagnosis present

## 2024-04-30 DIAGNOSIS — M6281 Muscle weakness (generalized): Secondary | ICD-10-CM | POA: Diagnosis present

## 2024-04-30 DIAGNOSIS — M79642 Pain in left hand: Secondary | ICD-10-CM | POA: Insufficient documentation
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
## 2024-05-01 ENCOUNTER — Ambulatory Visit: Admitting: Orthopedic Surgery

## 2024-05-01 ENCOUNTER — Other Ambulatory Visit (INDEPENDENT_AMBULATORY_CARE_PROVIDER_SITE_OTHER): Payer: Self-pay

## 2024-05-01 DIAGNOSIS — M1812 Unilateral primary osteoarthritis of first carpometacarpal joint, left hand: Secondary | ICD-10-CM | POA: Diagnosis not present

## 2024-05-01 NOTE — Progress Notes (Unsigned)
   Beverly Wright - 53 y.o. female MRN [id number]  Date of birth: [birth year]  Office Visit Note: Visit Date: 05/01/2024 PCP: Gretta Comer POUR, NP Referred by: Gretta Comer POUR, NP  Subjective:  HPI: Beverly Wright is a 53 y.o. female who presents today for follow up 2 weeks status post left thumb carpometacarpal arthroplasty with associated open carpal tunnel release.  She is doing well overall, was seen by occupational therapy yesterday for fabrication of a removable orthosis.  Notes tingling continues to improve in the hand, does mention some potential triggering of the ring finger.  Pertinent ROS were reviewed with the patient and found to be negative unless otherwise specified above in HPI.   Assessment & Plan: Visit Diagnoses:  1. Arthritis of carpometacarpal (CMC) joint of left thumb     Plan: She is doing well overall.  Sutures removed today.  Glad to see that she has her fabricated removable orthosis by occupational therapy.  X-rays reviewed today show stable appearance of the thumb metacarpal status post trapeziectomy.  Continue with postoperative protocol with OT.  Gentle range of motion, progressive range of motion at week 4 and progress and strengthening at week 8.  I will plan on seeing her back approxi-1 month to track progress.  Will keep an eye on the trigger digit of the hand, can intervene with potential therapy versus injections in the near future.  Follow-up: No follow-ups on file.   Meds & Orders: No orders of the defined types were placed in this encounter.   Orders Placed This Encounter  Procedures   XR Hand Complete Left     Procedures: No procedures performed       Objective:   Vital Signs: LMP 11/10/2016   Ortho Exam Left wrist: - Well-healed incision at the glabrous/nonglabrous juncture over the [facility] region of the thumb, skin is well-approximated without erythema or drainage - Palmar incision is also well-healed, Steri-Strips applied, sutures  removed - Gentle thumb circumduction without significant pain - Sensation remains slightly diminished in the median nerve distribution at the distal aspects of the index and long finger - Hand is warm well-perfused, sensation slightly diminished in the DRSN distribution - Thumb pinch strength not tested today   Imaging: XR Hand Complete Left Result Date: 05/02/2024 X-rays left hand demonstrate stable appearance of the thumb metacarpal status post trapeziectomy    Kaisa Wofford Estela) Nevaen Tredway, M.D. [facility], Hand Surgery

## 2024-05-02 ENCOUNTER — Encounter: Payer: Self-pay | Admitting: Neurosurgery

## 2024-05-07 ENCOUNTER — Ambulatory Visit: Admitting: Occupational Therapy

## 2024-05-07 DIAGNOSIS — R278 Other lack of coordination: Secondary | ICD-10-CM

## 2024-05-07 DIAGNOSIS — M6281 Muscle weakness (generalized): Secondary | ICD-10-CM | POA: Diagnosis not present

## 2024-05-07 DIAGNOSIS — M79642 Pain in left hand: Secondary | ICD-10-CM

## 2024-05-07 DIAGNOSIS — M1812 Unilateral primary osteoarthritis of first carpometacarpal joint, left hand: Secondary | ICD-10-CM
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
## 2024-05-12 ENCOUNTER — Ambulatory Visit: Admitting: Occupational Therapy

## 2024-05-12 DIAGNOSIS — K5903 Drug induced constipation: Secondary | ICD-10-CM | POA: Diagnosis not present

## 2024-05-12 DIAGNOSIS — M5010 Cervical disc disorder with radiculopathy, unspecified cervical region: Secondary | ICD-10-CM | POA: Diagnosis not present

## 2024-05-12 DIAGNOSIS — M47812 Spondylosis without myelopathy or radiculopathy, cervical region: Secondary | ICD-10-CM | POA: Diagnosis not present

## 2024-05-12 DIAGNOSIS — R519 Headache, unspecified: Secondary | ICD-10-CM | POA: Diagnosis not present

## 2024-05-12 DIAGNOSIS — G5602 Carpal tunnel syndrome, left upper limb: Secondary | ICD-10-CM | POA: Diagnosis not present

## 2024-05-12 DIAGNOSIS — M1990 Unspecified osteoarthritis, unspecified site: Secondary | ICD-10-CM | POA: Diagnosis not present

## 2024-05-12 DIAGNOSIS — G894 Chronic pain syndrome: Secondary | ICD-10-CM | POA: Diagnosis not present

## 2024-05-12 DIAGNOSIS — Z981 Arthrodesis status: Secondary | ICD-10-CM | POA: Diagnosis not present

## 2024-05-12 DIAGNOSIS — M791 Myalgia, unspecified site: Secondary | ICD-10-CM | POA: Diagnosis not present

## 2024-05-15 ENCOUNTER — Ambulatory Visit: Attending: Orthopedic Surgery | Admitting: Occupational Therapy

## 2024-05-15 DIAGNOSIS — M6281 Muscle weakness (generalized): Secondary | ICD-10-CM | POA: Insufficient documentation

## 2024-05-15 DIAGNOSIS — M1812 Unilateral primary osteoarthritis of first carpometacarpal joint, left hand: Secondary | ICD-10-CM | POA: Diagnosis present

## 2024-05-15 DIAGNOSIS — M79642 Pain in left hand: Secondary | ICD-10-CM | POA: Insufficient documentation

## 2024-05-15 DIAGNOSIS — R278 Other lack of coordination: Secondary | ICD-10-CM | POA: Insufficient documentation
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
## 2024-05-19 ENCOUNTER — Encounter: Payer: Self-pay | Admitting: Orthopedic Surgery

## 2024-05-19 ENCOUNTER — Ambulatory Visit: Admitting: Occupational Therapy

## 2024-05-19 DIAGNOSIS — R278 Other lack of coordination: Secondary | ICD-10-CM

## 2024-05-19 DIAGNOSIS — M1812 Unilateral primary osteoarthritis of first carpometacarpal joint, left hand: Secondary | ICD-10-CM

## 2024-05-19 DIAGNOSIS — M6281 Muscle weakness (generalized): Secondary | ICD-10-CM

## 2024-05-19 DIAGNOSIS — M79642 Pain in left hand: Secondary | ICD-10-CM

## 2024-05-19 NOTE — Telephone Encounter (Signed)
 Please review
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
## 2024-05-22 ENCOUNTER — Ambulatory Visit: Admitting: Occupational Therapy

## 2024-05-22 DIAGNOSIS — M1812 Unilateral primary osteoarthritis of first carpometacarpal joint, left hand: Secondary | ICD-10-CM

## 2024-05-22 DIAGNOSIS — M79642 Pain in left hand: Secondary | ICD-10-CM

## 2024-05-22 DIAGNOSIS — R278 Other lack of coordination: Secondary | ICD-10-CM

## 2024-05-22 DIAGNOSIS — M6281 Muscle weakness (generalized): Secondary | ICD-10-CM | POA: Diagnosis not present
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
## 2024-05-26 ENCOUNTER — Ambulatory Visit: Admitting: Occupational Therapy

## 2024-05-26 DIAGNOSIS — M6281 Muscle weakness (generalized): Secondary | ICD-10-CM | POA: Diagnosis not present

## 2024-05-26 DIAGNOSIS — M1812 Unilateral primary osteoarthritis of first carpometacarpal joint, left hand: Secondary | ICD-10-CM

## 2024-05-26 DIAGNOSIS — M79642 Pain in left hand: Secondary | ICD-10-CM
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
## 2024-05-28 ENCOUNTER — Ambulatory Visit: Admitting: Occupational Therapy

## 2024-05-29 ENCOUNTER — Ambulatory Visit: Admitting: Occupational Therapy

## 2024-05-29 DIAGNOSIS — M6281 Muscle weakness (generalized): Secondary | ICD-10-CM | POA: Diagnosis not present

## 2024-05-29 DIAGNOSIS — M79642 Pain in left hand: Secondary | ICD-10-CM

## 2024-05-29 DIAGNOSIS — R278 Other lack of coordination: Secondary | ICD-10-CM

## 2024-05-29 DIAGNOSIS — M1812 Unilateral primary osteoarthritis of first carpometacarpal joint, left hand: Secondary | ICD-10-CM
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
## 2024-06-02 ENCOUNTER — Ambulatory Visit: Admitting: Occupational Therapy

## 2024-06-02 DIAGNOSIS — M6281 Muscle weakness (generalized): Secondary | ICD-10-CM

## 2024-06-02 DIAGNOSIS — R278 Other lack of coordination: Secondary | ICD-10-CM

## 2024-06-02 DIAGNOSIS — M1812 Unilateral primary osteoarthritis of first carpometacarpal joint, left hand: Secondary | ICD-10-CM

## 2024-06-02 DIAGNOSIS — M79642 Pain in left hand: Secondary | ICD-10-CM
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
## 2024-06-03 ENCOUNTER — Ambulatory Visit: Admitting: Orthopedic Surgery

## 2024-06-03 ENCOUNTER — Other Ambulatory Visit: Payer: Self-pay

## 2024-06-03 DIAGNOSIS — M1812 Unilateral primary osteoarthritis of first carpometacarpal joint, left hand: Secondary | ICD-10-CM

## 2024-06-03 DIAGNOSIS — M65352 Trigger finger, left little finger: Secondary | ICD-10-CM | POA: Diagnosis not present

## 2024-06-03 MED ORDER — BETAMETHASONE SOD PHOS & ACET 6 (3-3) MG/ML IJ SUSP
6.0000 mg | INTRAMUSCULAR | Status: AC | PRN
  Administered 2024-06-03: 6 mg via INTRA_ARTICULAR

## 2024-06-03 MED ORDER — LIDOCAINE HCL 1 % IJ SOLN
1.0000 mL | INTRAMUSCULAR | Status: AC | PRN
  Administered 2024-06-03: 1 mL

## 2024-06-03 NOTE — Progress Notes (Signed)
   Beverly Wright - 53 y.o. female MRN [id number]  Date of birth: 02-Nov-1970  Office Visit Note: Visit Date: 06/03/2024 PCP: Gretta Comer POUR, NP Referred by: Gretta Comer POUR, NP  Subjective:  HPI: Beverly Wright is a 53 y.o. female who presents today for follow up 6 weeks status post left thumb carpometacarpal arthroplasty with associated open carpal tunnel release.  She is having ongoing swelling and soreness throughout the hand and wrist region which is limiting her therapeutic progress.  Is also describing fixed flexed position of the small finger with notable locking.  Pertinent ROS were reviewed with the patient and found to be negative unless otherwise specified above in HPI.   Assessment & Plan: Visit Diagnoses:  1. Trigger finger, left little finger   2. Arthritis of carpometacarpal (CMC) joint of left thumb     Plan: She is having some slow progress with healing at this time with ongoing inflammation throughout the hand and wrist region.  Hand does remain persistently swollen.  I did explain to her that this is okay, we will simply slow down her therapy at this time and allow for further healing.  Will communicate this to our occupational therapy team.    As far as the small finger, this appears to be an early stage trigger digit.  Cortisone injection was administered today to the right small finger A1 pulley for symptom relief.  I will have her follow-up with myself in 2 weeks for recheck.  Follow-up: No follow-ups on file.   Meds & Orders: No orders of the defined types were placed in this encounter.   Orders Placed This Encounter  Procedures   Hand/UE Inj     Procedures: Hand/UE Inj: L small A1 for trigger finger on 06/03/2024 3:55 PM Indications: pain Details: 25 G needle, volar approach Medications: 1 mL lidocaine  1 %; 6 mg betamethasone  acetate-betamethasone  sodium phosphate  6 (3-3) MG/ML Outcome: tolerated well, no immediate complications Consent was  given by the patient. Patient was prepped and draped in the usual sterile fashion.           Objective:   Vital Signs: LMP 11/10/2016   Ortho Exam Left wrist: - Well-healed incision at the glabrous/nonglabrous juncture over the [facility] region of the thumb, skin is well-approximated without erythema or drainage - Palmar incision is also well-healed, Steri-Strips applied, sutures removed - Gentle thumb circumduction without significant pain - Sensation remains slightly diminished in the median nerve distribution at the distal aspects of the index and long finger - Hand is warm well-perfused, sensation slightly diminished in the DRSN distribution - Thumb pinch strength not tested today   Imaging: No results found.    Aloise Copus Afton Alderton, M.D. [facility], Hand Surgery

## 2024-06-04 DIAGNOSIS — M791 Myalgia, unspecified site: Secondary | ICD-10-CM | POA: Diagnosis not present

## 2024-06-04 DIAGNOSIS — Z981 Arthrodesis status: Secondary | ICD-10-CM | POA: Diagnosis not present

## 2024-06-04 DIAGNOSIS — M1990 Unspecified osteoarthritis, unspecified site: Secondary | ICD-10-CM | POA: Diagnosis not present

## 2024-06-04 DIAGNOSIS — M5010 Cervical disc disorder with radiculopathy, unspecified cervical region: Secondary | ICD-10-CM | POA: Diagnosis not present

## 2024-06-04 DIAGNOSIS — G5602 Carpal tunnel syndrome, left upper limb: Secondary | ICD-10-CM | POA: Diagnosis not present

## 2024-06-04 DIAGNOSIS — R519 Headache, unspecified: Secondary | ICD-10-CM | POA: Diagnosis not present

## 2024-06-04 DIAGNOSIS — K5903 Drug induced constipation: Secondary | ICD-10-CM | POA: Diagnosis not present

## 2024-06-04 DIAGNOSIS — G894 Chronic pain syndrome: Secondary | ICD-10-CM | POA: Diagnosis not present

## 2024-06-04 DIAGNOSIS — M47812 Spondylosis without myelopathy or radiculopathy, cervical region: Secondary | ICD-10-CM | POA: Diagnosis not present

## 2024-06-05 ENCOUNTER — Ambulatory Visit: Admitting: Occupational Therapy

## 2024-06-05 DIAGNOSIS — R278 Other lack of coordination: Secondary | ICD-10-CM

## 2024-06-05 DIAGNOSIS — M79642 Pain in left hand: Secondary | ICD-10-CM

## 2024-06-05 DIAGNOSIS — M6281 Muscle weakness (generalized): Secondary | ICD-10-CM

## 2024-06-05 DIAGNOSIS — M1812 Unilateral primary osteoarthritis of first carpometacarpal joint, left hand: Secondary | ICD-10-CM
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
## 2024-06-10 ENCOUNTER — Ambulatory Visit: Attending: Orthopedic Surgery | Admitting: Occupational Therapy

## 2024-06-10 DIAGNOSIS — M79642 Pain in left hand: Secondary | ICD-10-CM | POA: Insufficient documentation

## 2024-06-10 DIAGNOSIS — M1812 Unilateral primary osteoarthritis of first carpometacarpal joint, left hand: Secondary | ICD-10-CM | POA: Diagnosis present

## 2024-06-10 DIAGNOSIS — R278 Other lack of coordination: Secondary | ICD-10-CM | POA: Insufficient documentation

## 2024-06-10 DIAGNOSIS — M6281 Muscle weakness (generalized): Secondary | ICD-10-CM | POA: Diagnosis present

## 2024-06-10 DIAGNOSIS — R208 Other disturbances of skin sensation: Secondary | ICD-10-CM | POA: Insufficient documentation
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
## 2024-06-12 ENCOUNTER — Encounter: Payer: Self-pay | Admitting: Occupational Therapy

## 2024-06-12 ENCOUNTER — Ambulatory Visit: Admitting: Occupational Therapy

## 2024-06-12 DIAGNOSIS — M1812 Unilateral primary osteoarthritis of first carpometacarpal joint, left hand: Secondary | ICD-10-CM

## 2024-06-12 DIAGNOSIS — M79642 Pain in left hand: Secondary | ICD-10-CM

## 2024-06-12 DIAGNOSIS — R278 Other lack of coordination: Secondary | ICD-10-CM

## 2024-06-12 DIAGNOSIS — M6281 Muscle weakness (generalized): Secondary | ICD-10-CM
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
## 2024-06-16 ENCOUNTER — Ambulatory Visit: Admitting: Occupational Therapy

## 2024-06-16 DIAGNOSIS — M6281 Muscle weakness (generalized): Secondary | ICD-10-CM

## 2024-06-16 DIAGNOSIS — M1812 Unilateral primary osteoarthritis of first carpometacarpal joint, left hand: Secondary | ICD-10-CM

## 2024-06-16 DIAGNOSIS — R278 Other lack of coordination: Secondary | ICD-10-CM

## 2024-06-16 DIAGNOSIS — M79642 Pain in left hand: Secondary | ICD-10-CM | POA: Diagnosis not present
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
## 2024-06-16 NOTE — Patient Instructions (Addendum)
 Desensitization Techniques  The goal of desensitization and resensitization is to inhibit or interrupt the body's interpretation of routine stimuli as painful. It does not ensure that these stimuli will become pleasant or enjoyable or that your feeling will be completely normal, but that they will no longer provoke an extreme pain response, that you are more sensory aware and that you are safe despite any residual deficits.    One way to desensitize a painful incision or tingly area OR to resensitize a numb area is by rubbing it with different textures. This will make your limb more tolerant to touch and pressure. Before you begin, make sure your hands and the materials you're using are clean.  To rub your painful/sensitive/numb/tingly area with different textures:  Sit in a comfortable position with the painful/sensitive/numb/tingly area uncovered. Start with a material that is soft, such as a cotton ball, kleenex, silk soft towel q-tips, sponge, or shaving cream,. Rub your painful/sensitive/tingly area in all directions. Start with a light pressure and gradually increase the pressure. Vary the textures you use as you are able to tolerate them. Start with soft materials like cotton balls or a makeup brush. Progress to materials that are rougher. Examples include a brush, rough towel, warm wash clothes, velcro, toothbrush, erasers, gloves, tennis ball, paint brush, wool, cotton balls, leather, rubber, etc. As you progress, gradually increase the pressure and roughness of the texture you use. Be careful not to rub over an incision if you still have staples or sutures in place, or if there are any open areas. Rub your limb for at least 30 seconds progressing up to a minute or two as often as you can tolerate, or as recommended by your healthcare provider.  Other methods for desensitization include: Brushing: use a hair brush or combing in circular or sweeping motions over the area Tapping: use your  hand to tap or pat the area Towel rubs: use a dry towel and rub the area in circular or sweeping motions Massage: massage the area with your hands, may use lotion Vibration: apply home massager or other vibration tool over area Light touch: gently "tickle" the area with your fingertips Allow cool or warm water to run over the area. Dipping your affected area into a bowl of dry rice, sand, kidney beans, cold water or warm water. If you are able to dip your affected area into a medium such as rice, sand, or water - make sure to move the affected area around in the bowl until you cannot tolerate it or you reach one to two minutes, whichever comes first.  Use mental imagery and verbal/mental feedback ie) look at the object and tell yourself what the surface property/texture is while exploring the different objects/textures with affected digits.  Remember to apply regular stimulus to the affected area of the body for short durations, frequently throughout the day to increase sensory input to the brain ie)  use a combination of these methods for 10 to 15 minutes, 3-4 times per day.  Vary the objects you use, the difficulty of tasks (with and without vision) and try to incorporate sensory stimulation during different activities throughout the day/week. With time, the body will acclimate to the sensation, leading to a decrease in the body's pain response or hypersensitivity to the stimulus.   Desensitization program  The most effective way to diminish hypersensitivity is by repeated touching of the sensitive area.  The following program will assist in the desensitization process and should be performed  5 to 10 minutes of each hour you are awake:  1.  Beginning with the texture which causes the least discomfort, rub the sensitive area lightly for 10 minutes or until area feels numb and no longer sensitive.  2.  In an hour, return to the same texture and rub as before.  However, if this texture seems to  cause no abnormal feelings, it is time to progress to a rougher texture.  Do not return to the softer texture; continue to progress through the list until you complete it.  It is important to be very consistent with this treatment.  The closer the program is followed, the faster you will find relief of your symptoms.  1.  Fur/cotton balls 2.  Flannel cloth 3.  Cotton fabric 4.  Denim fabric 5.  Burlap 6.  Raw peas/beans   7.  Raw rice 8.  Uncooked macaroni 9.  Metal, i.e. paperclips 10.  Tapping on edge of table 11.  Vibration

## 2024-06-19 ENCOUNTER — Ambulatory Visit: Admitting: Occupational Therapy

## 2024-06-19 DIAGNOSIS — M79642 Pain in left hand: Secondary | ICD-10-CM

## 2024-06-19 DIAGNOSIS — R278 Other lack of coordination: Secondary | ICD-10-CM

## 2024-06-19 DIAGNOSIS — M6281 Muscle weakness (generalized): Secondary | ICD-10-CM

## 2024-06-19 DIAGNOSIS — R208 Other disturbances of skin sensation: Secondary | ICD-10-CM

## 2024-06-19 NOTE — Patient Instructions (Addendum)
    Desensitization Techniques  One way to desensitize a painful incision or area is by rubbing it with different textures. This will make your limb more tolerant to touch and pressure. Before you begin, make sure your hands and the materials you're using are clean.  To rub your painful/sensitive area with different textures:  Sit in a comfortable position with the painful/sensitive area uncovered. Start with a material that is soft, such as a cotton ball, silk or soft towel. Rub your painful/sensitive area in all directions. Start with a light pressure and gradually increase the pressure. Vary the textures you use as you are able to tolerate them. Start with soft materials like cotton balls or a makeup brush. Progress to materials that are rougher. Examples include a paper towel, cloth towel, wool, or velcro. As you progress, gradually increase the pressure and roughness of the texture you use. Be careful not to rub over your incision if you still have staples or sutures in place, or if there are any open areas. Rub your limb for at least 30 seconds progressing up to a minute or two as often as you can tolerate, or as recommended by your healthcare provider.  Other methods for desensitization include:  Dipping your affected area into a bowl of dry rice, sand, kidney beans, cold water or warm water. Allow cool or warm water to run over the area. Using a TENS unit - make sure to ask your physician or physical therapist prior to using one. If you choose to start with the method of dipping your affected area into a medium such as rice, sand, or water make sure to move the affected area around in the bowl until you cannot tolerate it or you reach one to two minutes, whichever comes first. Use a combination of these methods for 10 to 15 minutes, 3-4 times per day.  The goal of desensitization is to inhibit or interrupt the body's interpretation of routine stimuli as painful. It does not assure that  these stimuli will become pleasant or enjoyable, but that they will no longer provoke an extreme pain response.   If you choose to start with the method of dipping your affected area into a medium such as rice, sand, or water make sure to move the affected area around in the bowl until you cannot tolerate it or you reach one to two minutes, whichever comes first. You can use a combination of these methods for 10 to 15 minutes, 3-4 times per day.
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
## 2024-06-23 ENCOUNTER — Ambulatory Visit: Admitting: Occupational Therapy

## 2024-06-23 DIAGNOSIS — M79642 Pain in left hand: Secondary | ICD-10-CM | POA: Diagnosis not present

## 2024-06-23 DIAGNOSIS — R278 Other lack of coordination: Secondary | ICD-10-CM

## 2024-06-23 DIAGNOSIS — M6281 Muscle weakness (generalized): Secondary | ICD-10-CM

## 2024-06-23 DIAGNOSIS — R208 Other disturbances of skin sensation: Secondary | ICD-10-CM
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
## 2024-06-24 ENCOUNTER — Ambulatory Visit: Admitting: Orthopedic Surgery

## 2024-06-24 ENCOUNTER — Other Ambulatory Visit: Payer: Self-pay | Admitting: Orthopedic Surgery

## 2024-06-24 DIAGNOSIS — M1812 Unilateral primary osteoarthritis of first carpometacarpal joint, left hand: Secondary | ICD-10-CM

## 2024-06-24 DIAGNOSIS — G5602 Carpal tunnel syndrome, left upper limb: Secondary | ICD-10-CM

## 2024-06-24 MED ORDER — GABAPENTIN 100 MG PO CAPS: 100.0000 mg | ORAL_CAPSULE | Freq: Every day | ORAL | 0 refills | Status: DC

## 2024-06-24 NOTE — Progress Notes (Signed)
   Beverly Wright - 53 y.o. female MRN [id number]  Date of birth: 30-Sep-1971  Office Visit Note: Visit Date: 06/24/2024 PCP: Gretta Comer POUR, NP Referred by: Gretta Comer POUR, NP  Subjective:  HPI: Beverly Wright is a 53 y.o. female who presents today for follow up 66-month status post left thumb carpometacarpal arthroplasty with associated open carpal tunnel release.  She is having ongoing swelling and soreness throughout the hand and wrist region which is slowly improving.  Was having some triggering of the left small finger which has responded to recent cortisone injection at the A1 pulley region.  Pertinent ROS were reviewed with the patient and found to be negative unless otherwise specified above in HPI.   Assessment & Plan: Visit Diagnoses:  1. Arthritis of carpometacarpal (CMC) joint of left thumb   2. Carpal tunnel syndrome, left upper limb      Plan: She does demonstrate some interval improvement today on examination from both the swelling and range of motion perspective.  Continue with occupational therapy moving forward.  As previously mentioned, we will put her on a more slow gradual progression before moving to significant strengthening activities.  Follow-up with myself in approximate 1 month.  Follow-up: No follow-ups on file.   Meds & Orders: No orders of the defined types were placed in this encounter.   No orders of the defined types were placed in this encounter.    Procedures: No procedures performed       Objective:   Vital Signs: LMP 11/10/2016   Ortho Exam Left wrist: - Well-healed incision at the glabrous/nonglabrous juncture over the [facility] region of the thumb, skin is well-approximated without erythema or drainage - Palmar incision is also well-healed, Steri-Strips applied, sutures removed - Gentle thumb circumduction without significant pain - Sensation remains slightly diminished in the median nerve distribution at the distal aspects of the  index and long finger - Hand is warm well-perfused, sensation slightly diminished in the DRSN distribution - Thumb pinch strength not tested today   Imaging: No results found.    Jove Beyl Afton Alderton, M.D. [facility], Hand Surgery

## 2024-06-26 ENCOUNTER — Ambulatory Visit: Admitting: Occupational Therapy

## 2024-06-26 DIAGNOSIS — R278 Other lack of coordination: Secondary | ICD-10-CM

## 2024-06-26 DIAGNOSIS — M79642 Pain in left hand: Secondary | ICD-10-CM | POA: Diagnosis not present

## 2024-06-26 DIAGNOSIS — M1812 Unilateral primary osteoarthritis of first carpometacarpal joint, left hand: Secondary | ICD-10-CM

## 2024-06-26 DIAGNOSIS — M6281 Muscle weakness (generalized): Secondary | ICD-10-CM
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
## 2024-06-30 ENCOUNTER — Ambulatory Visit: Admitting: Occupational Therapy

## 2024-07-03 ENCOUNTER — Ambulatory Visit: Admitting: Occupational Therapy

## 2024-07-03 DIAGNOSIS — M1812 Unilateral primary osteoarthritis of first carpometacarpal joint, left hand: Secondary | ICD-10-CM

## 2024-07-03 DIAGNOSIS — M79642 Pain in left hand: Secondary | ICD-10-CM | POA: Diagnosis not present

## 2024-07-03 DIAGNOSIS — R208 Other disturbances of skin sensation: Secondary | ICD-10-CM

## 2024-07-03 DIAGNOSIS — R278 Other lack of coordination: Secondary | ICD-10-CM

## 2024-07-03 DIAGNOSIS — M6281 Muscle weakness (generalized): Secondary | ICD-10-CM
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
## 2024-07-08 DIAGNOSIS — Z79891 Long term (current) use of opiate analgesic: Secondary | ICD-10-CM | POA: Diagnosis not present

## 2024-07-09 DIAGNOSIS — G894 Chronic pain syndrome: Secondary | ICD-10-CM | POA: Diagnosis not present

## 2024-07-09 DIAGNOSIS — M1990 Unspecified osteoarthritis, unspecified site: Secondary | ICD-10-CM | POA: Diagnosis not present

## 2024-07-09 DIAGNOSIS — M5010 Cervical disc disorder with radiculopathy, unspecified cervical region: Secondary | ICD-10-CM | POA: Diagnosis not present

## 2024-07-09 DIAGNOSIS — Z981 Arthrodesis status: Secondary | ICD-10-CM | POA: Diagnosis not present

## 2024-07-09 DIAGNOSIS — K5903 Drug induced constipation: Secondary | ICD-10-CM | POA: Diagnosis not present

## 2024-07-09 DIAGNOSIS — R519 Headache, unspecified: Secondary | ICD-10-CM | POA: Diagnosis not present

## 2024-07-09 DIAGNOSIS — M791 Myalgia, unspecified site: Secondary | ICD-10-CM | POA: Diagnosis not present

## 2024-07-09 DIAGNOSIS — M47812 Spondylosis without myelopathy or radiculopathy, cervical region: Secondary | ICD-10-CM | POA: Diagnosis not present

## 2024-07-09 DIAGNOSIS — G5602 Carpal tunnel syndrome, left upper limb: Secondary | ICD-10-CM | POA: Diagnosis not present

## 2024-07-10 ENCOUNTER — Ambulatory Visit: Attending: Orthopedic Surgery | Admitting: Occupational Therapy

## 2024-07-10 DIAGNOSIS — R278 Other lack of coordination: Secondary | ICD-10-CM | POA: Insufficient documentation

## 2024-07-10 DIAGNOSIS — R600 Localized edema: Secondary | ICD-10-CM | POA: Insufficient documentation

## 2024-07-10 DIAGNOSIS — M6281 Muscle weakness (generalized): Secondary | ICD-10-CM | POA: Diagnosis present

## 2024-07-10 DIAGNOSIS — M79642 Pain in left hand: Secondary | ICD-10-CM | POA: Insufficient documentation

## 2024-07-10 DIAGNOSIS — R208 Other disturbances of skin sensation: Secondary | ICD-10-CM | POA: Diagnosis present

## 2024-07-10 DIAGNOSIS — M1812 Unilateral primary osteoarthritis of first carpometacarpal joint, left hand: Secondary | ICD-10-CM | POA: Insufficient documentation
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
## 2024-07-16 ENCOUNTER — Ambulatory Visit: Admitting: Occupational Therapy

## 2024-07-16 DIAGNOSIS — M1812 Unilateral primary osteoarthritis of first carpometacarpal joint, left hand: Secondary | ICD-10-CM

## 2024-07-16 DIAGNOSIS — R600 Localized edema: Secondary | ICD-10-CM

## 2024-07-16 DIAGNOSIS — M79642 Pain in left hand: Secondary | ICD-10-CM | POA: Diagnosis not present

## 2024-07-16 DIAGNOSIS — M6281 Muscle weakness (generalized): Secondary | ICD-10-CM

## 2024-07-16 DIAGNOSIS — R278 Other lack of coordination: Secondary | ICD-10-CM

## 2024-07-16 DIAGNOSIS — R208 Other disturbances of skin sensation: Secondary | ICD-10-CM
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
## 2024-07-22 ENCOUNTER — Ambulatory Visit (INDEPENDENT_AMBULATORY_CARE_PROVIDER_SITE_OTHER): Admitting: Orthopedic Surgery

## 2024-07-22 DIAGNOSIS — R200 Anesthesia of skin: Secondary | ICD-10-CM

## 2024-07-22 DIAGNOSIS — M1812 Unilateral primary osteoarthritis of first carpometacarpal joint, left hand: Secondary | ICD-10-CM

## 2024-07-22 DIAGNOSIS — R202 Paresthesia of skin: Secondary | ICD-10-CM

## 2024-07-22 NOTE — Progress Notes (Signed)
   Riyah Bardon - 53 y.o. female MRN [id number]  Date of birth: 1971/03/03  Office Visit Note: Visit Date: 07/22/2024 PCP: Gretta Comer POUR, NP Referred by: Gretta Comer POUR, NP  Subjective:  HPI: Arnette Driggs is a 53 y.o. female who presents today for follow up 13-month status post left thumb carpometacarpal arthroplasty with associated open carpal tunnel release.  Continues to have some swelling and soreness throughout the basilar aspect of the left thumb.  Was having some triggering of the left small finger which has responded to recent cortisone injection at the A1 pulley region.  Also feels that the left ring finger may be beginning to trigger somewhat as well.  She is also developing some ongoing numbness and tingling in the right sided median nerve distribution which has become progressive.  Has not undergone prior electrodiagnostic study of the right side.  Pertinent ROS were reviewed with the patient and found to be negative unless otherwise specified above in HPI.   Assessment & Plan: Visit Diagnoses:  1. Numbness and tingling in right hand   2. Arthritis of carpometacarpal (CMC) joint of left thumb       Plan: Continue with occupational therapy exercises of the left hand and wrist as instructed.  We did discuss the possibility of a CRPS type picture in her case, particular given her history this may be playing an element in her slow recovery.    As for the right hand, we will proceed with electrodiagnostic study in order to better determine potential for ongoing nerve compression in this region.  She expressed understanding, will return to me in approximate 6 weeks to review results of electrodiagnostic study and to continue to follow the left hand and wrist.  Follow-up: No follow-ups on file.   Meds & Orders: No orders of the defined types were placed in this encounter.   Orders Placed This Encounter  Procedures   Ambulatory referral to Physical Medicine Rehab      Procedures: No procedures performed       Objective:   Vital Signs: LMP 11/10/2016   Ortho Exam Left wrist: - Well-healed incision at the glabrous/nonglabrous juncture over the [facility] region of the thumb, skin is well-approximated without erythema or drainage - Palmar incision is also well-healed - Gentle thumb circumduction without significant pain, thumb opposition to the long finger PIP - Sensation remains slightly diminished in the median nerve distribution at the distal aspects of the index and long finger - Hand is warm well-perfused, sensation diminished in the DRSN distribution at the distal aspect     Imaging: No results found.    Saniyya Gau Afton Alderton, M.D. [facility], Hand Surgery

## 2024-07-24 ENCOUNTER — Ambulatory Visit: Admitting: Occupational Therapy

## 2024-07-24 DIAGNOSIS — M1812 Unilateral primary osteoarthritis of first carpometacarpal joint, left hand: Secondary | ICD-10-CM

## 2024-07-24 DIAGNOSIS — R208 Other disturbances of skin sensation: Secondary | ICD-10-CM

## 2024-07-24 DIAGNOSIS — M6281 Muscle weakness (generalized): Secondary | ICD-10-CM

## 2024-07-24 DIAGNOSIS — R600 Localized edema: Secondary | ICD-10-CM

## 2024-07-24 DIAGNOSIS — M79642 Pain in left hand: Secondary | ICD-10-CM

## 2024-07-24 DIAGNOSIS — R278 Other lack of coordination: Secondary | ICD-10-CM
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
## 2024-07-30 DIAGNOSIS — R519 Headache, unspecified: Secondary | ICD-10-CM | POA: Diagnosis not present

## 2024-07-30 DIAGNOSIS — G894 Chronic pain syndrome: Secondary | ICD-10-CM | POA: Diagnosis not present

## 2024-07-30 DIAGNOSIS — Z981 Arthrodesis status: Secondary | ICD-10-CM | POA: Diagnosis not present

## 2024-07-30 DIAGNOSIS — Z79891 Long term (current) use of opiate analgesic: Secondary | ICD-10-CM | POA: Diagnosis not present

## 2024-07-30 DIAGNOSIS — M5010 Cervical disc disorder with radiculopathy, unspecified cervical region: Secondary | ICD-10-CM | POA: Diagnosis not present

## 2024-07-30 DIAGNOSIS — M1990 Unspecified osteoarthritis, unspecified site: Secondary | ICD-10-CM | POA: Diagnosis not present

## 2024-07-30 DIAGNOSIS — G5602 Carpal tunnel syndrome, left upper limb: Secondary | ICD-10-CM | POA: Diagnosis not present

## 2024-07-30 DIAGNOSIS — M791 Myalgia, unspecified site: Secondary | ICD-10-CM | POA: Diagnosis not present

## 2024-07-30 DIAGNOSIS — K5903 Drug induced constipation: Secondary | ICD-10-CM | POA: Diagnosis not present

## 2024-07-30 DIAGNOSIS — M47812 Spondylosis without myelopathy or radiculopathy, cervical region: Secondary | ICD-10-CM | POA: Diagnosis not present

## 2024-08-01 ENCOUNTER — Ambulatory Visit: Admitting: Occupational Therapy

## 2024-08-07 ENCOUNTER — Ambulatory Visit: Admitting: Occupational Therapy

## 2024-08-07 DIAGNOSIS — R278 Other lack of coordination: Secondary | ICD-10-CM

## 2024-08-07 DIAGNOSIS — M79642 Pain in left hand: Secondary | ICD-10-CM | POA: Diagnosis not present

## 2024-08-07 DIAGNOSIS — R600 Localized edema: Secondary | ICD-10-CM

## 2024-08-07 DIAGNOSIS — R208 Other disturbances of skin sensation: Secondary | ICD-10-CM

## 2024-08-07 DIAGNOSIS — M6281 Muscle weakness (generalized): Secondary | ICD-10-CM
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
## 2024-08-08 ENCOUNTER — Ambulatory Visit (INDEPENDENT_AMBULATORY_CARE_PROVIDER_SITE_OTHER): Admitting: Physical Medicine and Rehabilitation

## 2024-08-08 DIAGNOSIS — R29898 Other symptoms and signs involving the musculoskeletal system: Secondary | ICD-10-CM

## 2024-08-08 DIAGNOSIS — M79641 Pain in right hand: Secondary | ICD-10-CM | POA: Diagnosis not present

## 2024-08-08 DIAGNOSIS — R202 Paresthesia of skin: Secondary | ICD-10-CM

## 2024-08-08 DIAGNOSIS — M4322 Fusion of spine, cervical region: Secondary | ICD-10-CM | POA: Diagnosis not present

## 2024-08-08 NOTE — Progress Notes (Signed)
 Pain Scale   Average Pain 0 Patient advising she has chronic right hand numbness and tingling with some noted weakness. Patient is Right hand dominate.        +Driver, -BT, -Dye Allergies.

## 2024-08-08 NOTE — Procedures (Signed)
 EMG & NCV Findings: Evaluation of the right median motor nerve showed prolonged distal onset latency (5.7 ms), reduced amplitude (4.4 mV), and decreased conduction velocity (Elbow-Wrist, 43 m/s).  The right median (across palm) sensory nerve showed prolonged distal peak latency (Wrist, 4.8 ms).  All remaining nerves (as indicated in the following tables) were within normal limits.    All examined muscles (as indicated in the following table) showed no evidence of electrical instability.    Impression: The above electrodiagnostic study is ABNORMAL and reveals evidence of a moderate to severe right median nerve entrapment at the wrist (carpal tunnel syndrome) affecting sensory and motor components.   There is no significant electrodiagnostic evidence of any other focal nerve entrapment, brachial plexopathy or cervical radiculopathy.   Recommendations: 1.  Follow-up with referring physician. 2.  Continue current management of symptoms. 3.  Suggest surgical evaluation.  ___________________________ Prentice Masters FAAPMR Board Certified, American Board of Physical Medicine and Rehabilitation    Nerve Conduction Studies Anti Sensory Summary Table   Stim Site NR Peak (ms) Norm Peak (ms) P-T Amp (V) Norm P-T Amp Site1 Site2 Delta-P (ms) Dist (cm) Vel (m/s) Norm Vel (m/s)  Right Median Acr Palm Anti Sensory (2nd Digit)  30.8C  Wrist    *4.8 <3.6 15.7 >10 Wrist Palm 2.8 0.0    Palm    2.0 <2.0 28.1         Right Radial Anti Sensory (Base 1st Digit)  31C  Wrist    2.1 <3.1 15.6  Wrist Base 1st Digit 2.1 0.0    Right Ulnar Anti Sensory (5th Digit)  31.3C  Wrist    3.4 <3.7 19.9 >15.0 Wrist 5th Digit 3.4 14.0 41 >38   Motor Summary Table   Stim Site NR Onset (ms) Norm Onset (ms) O-P Amp (mV) Norm O-P Amp Site1 Site2 Delta-0 (ms) Dist (cm) Vel (m/s) Norm Vel (m/s)  Right Median Motor (Abd Poll Brev)  31.3C    martin-gruber  Wrist    *5.7 <4.2 *4.4 >5 Elbow Wrist 4.6 20.0 *43 >50  Elbow     10.3  3.3         Right Ulnar Motor (Abd Dig Min)  31.8C  Wrist    2.9 <4.2 9.2 >3 B Elbow Wrist 3.3 18.5 56 >53  B Elbow    6.2  6.8  A Elbow B Elbow 1.2 11.0 92 >53  A Elbow    7.4  8.4          EMG   Side Muscle Nerve Root Ins Act Fibs Psw Amp Dur Poly Recrt Int Bruna Comment  Right Abd Poll Brev Median C8-T1 Nml Nml Nml Nml Nml 0 Nml Nml   Right 1stDorInt Ulnar C8-T1 Nml Nml Nml Nml Nml 0 Nml Nml   Right PronatorTeres Median C6-7 Nml Nml Nml Nml Nml 0 Nml Nml   Right Biceps Musculocut C5-6 Nml Nml Nml Nml Nml 0 Nml Nml   Right Deltoid Axillary C5-6 Nml Nml Nml Nml Nml 0 Nml Nml     Nerve Conduction Studies Anti Sensory Left/Right Comparison   Stim Site L Lat (ms) R Lat (ms) L-R Lat (ms) L Amp (V) R Amp (V) L-R Amp (%) Site1 Site2 L Vel (m/s) R Vel (m/s) L-R Vel (m/s)  Median Acr Palm Anti Sensory (2nd Digit)  30.8C  Wrist  *4.8   15.7  Wrist Palm     Palm  2.0   28.1  Radial Anti Sensory (Base 1st Digit)  31C  Wrist  2.1   15.6  Wrist Base 1st Digit     Ulnar Anti Sensory (5th Digit)  31.3C  Wrist  3.4   19.9  Wrist 5th Digit  41    Motor Left/Right Comparison   Stim Site L Lat (ms) R Lat (ms) L-R Lat (ms) L Amp (mV) R Amp (mV) L-R Amp (%) Site1 Site2 L Vel (m/s) R Vel (m/s) L-R Vel (m/s)  Median Motor (Abd Poll Brev)  31.3C    martin-gruber  Wrist  *5.7   *4.4  Elbow Wrist  *43   Elbow  10.3   3.3        Ulnar Motor (Abd Dig Min)  31.8C  Wrist  2.9   9.2  B Elbow Wrist  56   B Elbow  6.2   6.8  A Elbow B Elbow  92   A Elbow  7.4   8.4           Waveforms:
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
## 2024-08-09 ENCOUNTER — Encounter: Payer: Self-pay | Admitting: Physical Medicine and Rehabilitation

## 2024-08-11 ENCOUNTER — Encounter: Payer: Self-pay | Admitting: Radiology

## 2024-08-14 ENCOUNTER — Ambulatory Visit: Attending: Orthopedic Surgery | Admitting: Occupational Therapy

## 2024-08-14 DIAGNOSIS — R208 Other disturbances of skin sensation: Secondary | ICD-10-CM | POA: Diagnosis present

## 2024-08-14 DIAGNOSIS — M6281 Muscle weakness (generalized): Secondary | ICD-10-CM | POA: Insufficient documentation

## 2024-08-14 DIAGNOSIS — M79642 Pain in left hand: Secondary | ICD-10-CM | POA: Diagnosis present

## 2024-08-14 DIAGNOSIS — R600 Localized edema: Secondary | ICD-10-CM | POA: Diagnosis present

## 2024-08-14 DIAGNOSIS — M1812 Unilateral primary osteoarthritis of first carpometacarpal joint, left hand: Secondary | ICD-10-CM | POA: Diagnosis present

## 2024-08-14 DIAGNOSIS — R278 Other lack of coordination: Secondary | ICD-10-CM | POA: Diagnosis present
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
## 2024-08-21 ENCOUNTER — Ambulatory Visit: Admitting: Occupational Therapy

## 2024-08-21 DIAGNOSIS — M6281 Muscle weakness (generalized): Secondary | ICD-10-CM

## 2024-08-21 DIAGNOSIS — R278 Other lack of coordination: Secondary | ICD-10-CM

## 2024-08-21 DIAGNOSIS — M79642 Pain in left hand: Secondary | ICD-10-CM

## 2024-08-21 DIAGNOSIS — R208 Other disturbances of skin sensation: Secondary | ICD-10-CM

## 2024-08-21 DIAGNOSIS — R600 Localized edema: Secondary | ICD-10-CM
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
## 2024-08-26 ENCOUNTER — Telehealth: Payer: Self-pay

## 2024-08-26 DIAGNOSIS — Z7189 Other specified counseling: Secondary | ICD-10-CM

## 2024-08-26 NOTE — Progress Notes (Signed)
 Complex Care Management Note  Care Guide Note 08/26/2024 Name: Beverly Wright MRN: [id number] DOB: Feb 17, 1971  Beverly Wright is a 54 y.o. year old female who sees Gretta Comer POUR, NP for primary care. I reached out to Maeola Donald by phone today to offer complex care management services.  Ms. Pewitt was given information about Complex Care Management services today including:   The Complex Care Management services include support from the care team which includes your Nurse Care Manager, Clinical Social Worker, or Pharmacist.  The Complex Care Management team is here to help remove barriers to the health concerns and goals most important to you. Complex Care Management services are voluntary, and the patient may decline or stop services at any time by request to their care team member.   Complex Care Management Consent Status: Patient agreed to services and verbal consent obtained.   Follow up plan:  Telephone appointment with complex care management team member scheduled for:  09/17/2024  Encounter Outcome:  Patient Scheduled  Jeoffrey Buffalo , RMA     [place]  [facility], [facility] Guide  Direct Dial: [phone]  Website: [email or web address]

## 2024-08-27 DIAGNOSIS — G5602 Carpal tunnel syndrome, left upper limb: Secondary | ICD-10-CM | POA: Diagnosis not present

## 2024-08-27 DIAGNOSIS — Z79891 Long term (current) use of opiate analgesic: Secondary | ICD-10-CM | POA: Diagnosis not present

## 2024-08-27 DIAGNOSIS — Z981 Arthrodesis status: Secondary | ICD-10-CM | POA: Diagnosis not present

## 2024-08-27 DIAGNOSIS — K5903 Drug induced constipation: Secondary | ICD-10-CM | POA: Diagnosis not present

## 2024-08-27 DIAGNOSIS — M5010 Cervical disc disorder with radiculopathy, unspecified cervical region: Secondary | ICD-10-CM | POA: Diagnosis not present

## 2024-08-27 DIAGNOSIS — M791 Myalgia, unspecified site: Secondary | ICD-10-CM | POA: Diagnosis not present

## 2024-08-27 DIAGNOSIS — M47812 Spondylosis without myelopathy or radiculopathy, cervical region: Secondary | ICD-10-CM | POA: Diagnosis not present

## 2024-08-27 DIAGNOSIS — M1990 Unspecified osteoarthritis, unspecified site: Secondary | ICD-10-CM | POA: Diagnosis not present

## 2024-08-27 DIAGNOSIS — G894 Chronic pain syndrome: Secondary | ICD-10-CM | POA: Diagnosis not present

## 2024-08-27 DIAGNOSIS — R519 Headache, unspecified: Secondary | ICD-10-CM | POA: Diagnosis not present

## 2024-08-29 ENCOUNTER — Ambulatory Visit: Admitting: Occupational Therapy

## 2024-08-29 DIAGNOSIS — R278 Other lack of coordination: Secondary | ICD-10-CM

## 2024-08-29 DIAGNOSIS — M6281 Muscle weakness (generalized): Secondary | ICD-10-CM

## 2024-08-29 DIAGNOSIS — R208 Other disturbances of skin sensation: Secondary | ICD-10-CM

## 2024-08-29 DIAGNOSIS — R600 Localized edema: Secondary | ICD-10-CM

## 2024-08-29 DIAGNOSIS — M79642 Pain in left hand: Secondary | ICD-10-CM | POA: Diagnosis not present

## 2024-08-29 DIAGNOSIS — M1812 Unilateral primary osteoarthritis of first carpometacarpal joint, left hand: Secondary | ICD-10-CM
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
## 2024-09-02 ENCOUNTER — Ambulatory Visit: Admitting: Orthopedic Surgery

## 2024-09-02 DIAGNOSIS — G5601 Carpal tunnel syndrome, right upper limb: Secondary | ICD-10-CM | POA: Diagnosis not present

## 2024-09-02 NOTE — Progress Notes (Signed)
   Beverly Wright - 53 y.o. female MRN [id number]  Date of birth: 04/13/1971  Office Visit Note: Visit Date: 09/02/2024 PCP: Gretta Comer POUR, NP Referred by: Gretta Comer POUR, NP  Subjective:  HPI: Beverly Wright is a 53 y.o. female who presents today for follow up 46-month status post left thumb carpometacarpal arthroplasty with associated open carpal tunnel release.  Continues to have some swelling and soreness throughout the basilar aspect of the left thumb.  Was having some triggering of the left small finger which has responded to recent cortisone injection at the A1 pulley region.  Also feels that the left ring finger may be beginning to trigger somewhat as well.  She is also developing some ongoing numbness and tingling in the right sided median nerve distribution which has become progressive.  She underwent recent electrodiagnostic testing of the right upper extremity which did confirm moderate to severe carpal tunnel syndrome.  Pertinent ROS were reviewed with the patient and found to be negative unless otherwise specified above in HPI.   Assessment & Plan: Visit Diagnoses:  1. Carpal tunnel syndrome, right upper limb      Plan: Extensive discussion was had with the patient today about her ongoing right sided carpal tunnel syndrome that is refractory to conservative care.  Patient has both clinical and electrodiagnostic evidence to confirm this diagnosis.  At this juncture, she is indicated for right open versus endoscopic carpal tunnel release.  Risks and benefits of both operations were discussed in detail today.  Understanding all risks and benefits, patient would like to have surgery done in the form of right endoscopic carpal tunnel release at the next available date.  Risks include but not limited to infection, bleeding, scarring, stiffness, nerve injury or vascular, tendon injury, risk of recurrence and need for subsequent operation were all discussed in detail.  Patient  consented understanding the above.  Will move forward surgical scheduling.   Follow-up: No follow-ups on file.   Meds & Orders: No orders of the defined types were placed in this encounter.   No orders of the defined types were placed in this encounter.    Procedures: No procedures performed       Objective:   Vital Signs: LMP 11/10/2016   Ortho Exam Left wrist: - Well-healed incision at the glabrous/nonglabrous juncture over the [facility] region of the thumb, skin is well-approximated without erythema or drainage - Palmar incision is also well-healed - Gentle thumb circumduction without significant pain, thumb opposition to the long finger PIP - Sensation remains slightly diminished in the median nerve distribution at the distal aspects of the index and long finger - Hand is warm well-perfused, sensation diminished in the DRSN distribution at the distal aspect  Right wrist: - Positive Tinel's carpal tunnel region, positive Phalen's, positive Durkan's compression - Sensation diminished in the median nerve distribution - Mild thenar atrophy, thumb opposition is well-preserved to the small finger DPC, APB 5/5   Imaging: No results found.    Beverly Wright Afton Alderton, M.D. [facility], Hand Surgery

## 2024-09-10 DIAGNOSIS — M791 Myalgia, unspecified site: Secondary | ICD-10-CM | POA: Diagnosis not present

## 2024-09-10 DIAGNOSIS — K5903 Drug induced constipation: Secondary | ICD-10-CM | POA: Diagnosis not present

## 2024-09-10 DIAGNOSIS — M1990 Unspecified osteoarthritis, unspecified site: Secondary | ICD-10-CM | POA: Diagnosis not present

## 2024-09-10 DIAGNOSIS — G894 Chronic pain syndrome: Secondary | ICD-10-CM | POA: Diagnosis not present

## 2024-09-10 DIAGNOSIS — Z79891 Long term (current) use of opiate analgesic: Secondary | ICD-10-CM | POA: Diagnosis not present

## 2024-09-10 DIAGNOSIS — R519 Headache, unspecified: Secondary | ICD-10-CM | POA: Diagnosis not present

## 2024-09-10 DIAGNOSIS — Z981 Arthrodesis status: Secondary | ICD-10-CM | POA: Diagnosis not present

## 2024-09-10 DIAGNOSIS — M47812 Spondylosis without myelopathy or radiculopathy, cervical region: Secondary | ICD-10-CM | POA: Diagnosis not present

## 2024-09-10 DIAGNOSIS — G5602 Carpal tunnel syndrome, left upper limb: Secondary | ICD-10-CM | POA: Diagnosis not present

## 2024-09-10 DIAGNOSIS — M5010 Cervical disc disorder with radiculopathy, unspecified cervical region: Secondary | ICD-10-CM | POA: Diagnosis not present

## 2024-09-17 ENCOUNTER — Other Ambulatory Visit: Payer: Self-pay
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
## 2024-09-17 NOTE — Patient Instructions (Signed)
 Visit Information  Beverly Wright was given information about Medicaid Managed Care team care coordination services as a part of their [place] [employer] Medicaid benefit.   If you would like to schedule transportation through your [place] [employer] plan, please call the following number at least 2 days in advance of your appointment: [phone].   There is no limit to the number of trips during the year between medical appointments, healthcare facilities, or pharmacies. Transportation must be scheduled at least 2 business days before but not more than thirty 30 days before of your appointment.  Call the Behavioral Health Crisis Line at [phone], at any time, 24 hours a day, 7 days a week. If you are in danger or need immediate medical attention call 911.   Please see education materials related to Asthma, Diet, HTN Advance Directives provided by MyChart link.  Patient verbalizes understanding of instructions and care plan provided today and agrees to view in MyChart. Active MyChart status and patient understanding of how to access instructions and care plan via MyChart confirmed with patient.     Telephone follow up appointment with Managed Medicaid care management team member scheduled for: 09/25/2024 at 10:00 am  Nestora Duos, MSN, RN [facility]  [facility], [facility] RN Care Manager Direct Dial: [phone] Fax: [phone]   Following is a copy of your plan of care:   Goals Addressed             This Visit's Progress    [facility] RN Care Plan - Weight Loss/Asthma       Problems:  Chronic Disease Management support and education needs related to Asthma and Weight loss Goals  Goal: Over the next 90 days the Patient will attend all scheduled medical appointments: Patient will attend all appointments as evidenced by chart review        collaborate with the care management team towards completion of advanced directives LW/HCPOA as  evidenced by patient reporting completion  continue to work with RN Care Manager and/or Social Worker to address care management and care coordination needs related to Asthma and Weight Loss Goals as evidenced by adherence to care management team scheduled appointments     take all medications exactly as prescribed and will call provider for medication related questions as evidenced by patient report    verbalize understanding of plan for management of Asthma and Weight loss goals as evidenced by discussing Wegovy and other options with PCP, make healthy food choices within financial constraints (heart healthy, low carb/low salt), report increased use of inhaler without improvement and/or respiratory illness not improving/worsening promptly to provider, take all medications as ordered,   Interventions:   Asthma: Provided patient with basic written and verbal Asthma education on self care/management/and exacerbation prevention Advised patient to track and manage Asthma triggers Provided instruction about proper use of medications used for management of Asthma including inhalers Advised patient to self assesses Asthma action plan zone and make appointment with provider if in the yellow zone for 48 hours without improvement Advised patient to engage in light exercise as tolerated 3-5 days a week to aid in the the management of Asthma Provided education about and advised patient to utilize infection prevention strategies to reduce risk of respiratory infection Discussed the importance of adequate rest and management of fatigue with Asthma Screening for signs and symptoms of depression related to chronic disease state - aware LCSW available if needed Assessed social determinant of health barriers - reports currently stable, previous issues under  control and aware of a variety of community resources and BSW through CCM   Weight Loss Interventions: Advised patient to discuss with primary care provider  options regarding weight management Provided verbal and/or written education to patient re: provider recommended life style modifications  Screening for signs and symptoms of depression Offered to connect patient with psychology or social work support for counseling and supportive care Reviewed recommended dietary changes: avoid fad diets, make small/incremental dietary and exercise changes, eat at the table and avoid eating in front of the TV, plan management of cravings, monitor snacking and cravings in food diary Advised patient to discuss BP and HR with provider  Advised patient to call [place] [employer] re benefits available and dental providers Patient Self-Care Activities:  Attend all scheduled provider appointments Attend church or other social activities Call pharmacy for medication refills 3-7 days in advance of running out of medications Call provider office for new concerns or questions  Take medications as prescribed   Work with the social worker to address care coordination needs and will continue to work with the clinical team to address health care and disease management related needs  Plan:  Telephone follow up appointment with care management team member scheduled for:  09/25/2024 at 10:00 am              Heart-Healthy Eating Plan Eating a healthy diet is important for the health of your heart. A heart-healthy eating plan includes: Eating less unhealthy fats. Eating more healthy fats. Eating less salt in your food. Salt is also called sodium. Making other changes in your diet. Talk with your doctor or a diet specialist (dietitian) to create an eating plan that is right for you. What is my plan? Your doctor may recommend an eating plan that includes: Total fat: ______% or less of total calories a day. Saturated fat: ______% or less of total calories a day. Cholesterol: less than _________mg a day. Sodium: less than _________mg a day. What are tips for following  this plan? Cooking Avoid frying your food. Try to bake, boil, grill, or broil it instead. You can also reduce fat by: Removing the skin from poultry. Removing all visible fats from meats. Steaming vegetables in water or broth. Meal planning  At meals, divide your plate into four equal parts: Fill one-half of your plate with vegetables and green salads. Fill one-fourth of your plate with whole grains. Fill one-fourth of your plate with lean protein foods. Eat 2-4 cups of vegetables per day. One cup of vegetables is: 1 cup (91 g) broccoli or cauliflower florets. 2 medium carrots. 1 large bell pepper. 1 large sweet potato. 1 large tomato. 1 medium white potato. 2 cups (150 g) raw leafy greens. Eat 1-2 cups of fruit per day. One cup of fruit is: 1 small apple 1 large banana 1 cup (237 g) mixed fruit, 1 large orange,  cup (82 g) dried fruit, 1 cup (240 mL) 100% fruit juice. Eat more foods that have soluble fiber. These are apples, broccoli, carrots, beans, peas, and barley. Try to get 20-30 g of fiber per day. Eat 4-5 servings of nuts, legumes, and seeds per week: 1 serving of dried beans or legumes equals  cup (90 g) cooked. 1 serving of nuts is  oz (12 almonds, 24 pistachios, or 7 walnut halves). 1 serving of seeds equals  oz (8 g). General information Eat more home-cooked food. Eat less restaurant, buffet, and fast food. Limit or avoid alcohol. Limit foods that are  high in starch and sugar. Avoid fried foods. Lose weight if you are overweight. Keep track of how much salt (sodium) you eat. This is important if you have high blood pressure. Ask your doctor to tell you more about this. Try to add vegetarian meals each week. Fats Choose healthy fats. These include olive oil and canola oil, flaxseeds, walnuts, almonds, and seeds. Eat more omega-3 fats. These include salmon, mackerel, sardines, tuna, flaxseed oil, and ground flaxseeds. Try to eat fish at least 2 times each  week. Check food labels. Avoid foods with trans fats or high amounts of saturated fat. Limit saturated fats. These are often found in animal products, such as meats, butter, and cream. These are also found in plant foods, such as palm oil, palm kernel oil, and coconut oil. Avoid foods with partially hydrogenated oils in them. These have trans fats. Examples are stick margarine, some tub margarines, cookies, crackers, and other baked goods. What foods should I eat? Fruits All fresh, canned (in natural juice), or frozen fruits. Vegetables Fresh or frozen vegetables (raw, steamed, roasted, or grilled). Green salads. Grains Most grains. Choose whole wheat and whole grains most of the time. Rice and pasta, including brown rice and pastas made with whole wheat. Meats and other proteins Lean, well-trimmed beef, veal, pork, and lamb. Chicken and turkey without skin. All fish and shellfish. Wild duck, rabbit, pheasant, and venison. Egg whites or low-cholesterol egg substitutes. Dried beans, peas, lentils, and tofu. Seeds and most nuts. Dairy Low-fat or nonfat cheeses, including ricotta and mozzarella. Skim or 1% milk that is liquid, powdered, or evaporated. Buttermilk that is made with low-fat milk. Nonfat or low-fat yogurt. Fats and oils Non-hydrogenated (trans-free) margarines. Vegetable oils, including soybean, sesame, sunflower, olive, peanut, safflower, corn, canola, and cottonseed. Salad dressings or mayonnaise made with a vegetable oil. Beverages Mineral water. Coffee and tea. Diet carbonated beverages. Sweets and desserts Sherbet, gelatin, and fruit ice. Small amounts of dark chocolate. Limit all sweets and desserts. Seasonings and condiments All seasonings and condiments. The items listed above may not be a complete list of foods and drinks you can eat. Contact a dietitian for more options. What foods should I avoid? Fruits Canned fruit in heavy syrup. Fruit in cream or butter sauce.  Fried fruit. Limit coconut. Vegetables Vegetables cooked in cheese, cream, or butter sauce. Fried vegetables. Grains Breads that are made with saturated or trans fats, oils, or whole milk. Croissants. Sweet rolls. Donuts. High-fat crackers, such as cheese crackers. Meats and other proteins Fatty meats, such as hot dogs, ribs, sausage, bacon, rib-eye roast or steak. High-fat deli meats, such as salami and bologna. Caviar. Domestic duck and goose. Organ meats, such as liver. Dairy Cream, sour cream, cream cheese, and creamed cottage cheese. Whole-milk cheeses. Whole or 2% milk that is liquid, evaporated, or condensed. Whole buttermilk. Cream sauce or high-fat cheese sauce. Yogurt that is made from whole milk. Fats and oils Meat fat, or shortening. Cocoa butter, hydrogenated oils, palm oil, coconut oil, palm kernel oil. Solid fats and shortenings, including bacon fat, salt pork, lard, and butter. Nondairy cream substitutes. Salad dressings with cheese or sour cream. Beverages Regular sodas and juice drinks with added sugar. Sweets and desserts Frosting. Pudding. Cookies. Cakes. Pies. Milk chocolate or white chocolate. Buttered syrups. Full-fat ice cream or ice cream drinks. The items listed above may not be a complete list of foods and drinks to avoid. Contact a dietitian for more information. Summary Heart-healthy meal planning includes eating less  unhealthy fats, eating more healthy fats, and making other changes in your diet. Eat a balanced diet. This includes fruits and vegetables, low-fat or nonfat dairy, lean protein, nuts and legumes, whole grains, and heart-healthy oils and fats. This information is not intended to replace advice given to you by your health care provider. Make sure you discuss any questions you have with your health care provider. Document Revised: 10/31/2021 Document Reviewed: 10/31/2021 Elsevier Patient Education  2024 [employer].  Advance Directive  Advance  directives are legal papers that state your wishes about health care decisions. They let your wishes be known to family, friends, and health care providers if you become unable to speak for yourself.  You should write these papers out over time rather than all at once. They can be changed and updated at any time. The types of advance directives include: Medical power of attorney (POA). Living wills. Do not resuscitate (DNR) or do not attempt resuscitation (DNAR) orders. What are a health care proxy and medical POA? A health care proxy is also called a health care agent. It's a person you choose to make medical decisions for you when you can't make them for yourself. In most cases, a proxy is a trusted friend or family member. A medical POA is legal paperwork that names your proxy. It may need to be: Signed. Notarized. Dated. Copied. Witnessed. Added to your medical record. You may also want to choose someone to handle your money if you can't do so. This is called a durable POA for finances. It's separate from a medical POA. You may choose your health care proxy or someone else to act as your agent in money matters. If you don't have a proxy, or if the proxy may not be acting in your best interest, a court may choose a guardian to act on your behalf. What is a living will? A living will is legal paperwork that states your wishes about medical care. Providers should keep a copy of it in your medical record. You may want to give a copy to family members or friends. You can also keep a card in your wallet to let loved ones know you have a living will and where they can find it. A living will may be used if: You're very sick with something that will end your life. You become disabled. You can't make decisions or speak for yourself. Your living will should include whether: To use or not use life support equipment. This may include machines to filter your blood or to help you breathe. You want a DNR  or DNAR order. This tells providers not to use CPR if your heart or breathing stops. To use or not use tube feeding. You want to be given foods and fluids. You want a type of comfort care called palliative care. This may be given when the goal for treatment becomes comfort rather than a cure. You want to donate your organs and tissues. A living will doesn't say what to do with your money and property if you pass away. What is a DNR or DNAR? A DNR or DNAR order is a request not to have CPR. If you don't have one of these orders, a provider will try to help you if your heart stops or you stop breathing.  If you plan to have surgery, talk with your provider about your DNR or DNAR order. What happens if I don't have an advance directive? Each state has its own laws about advance  directives. Some states assign family decision makers to act on your behalf if you don't have an advance directive.  Check with your provider, attorney, or state representative about the laws in your state. Where to find more information Each state has its own laws about advance directives. You can look up these laws at: https://rodriguez-phillips.com/ This information is not intended to replace advice given to you by your health care provider. Make sure you discuss any questions you have with your health care provider. Document Revised: 02/12/2023 Document Reviewed: 02/12/2023 Elsevier Patient Education  2024 Elsevier Inc.   Preventing Type 2 Diabetes Mellitus Type 2 diabetes, also called type 2 diabetes mellitus, is a long-term (chronic) disease that affects sugar (glucose) levels in your blood. Normally, a hormone called insulin allows glucose to enter cells in your body. The cells use glucose for energy. With type 2 diabetes, you will have one or both of these problems: Your pancreas does not make enough insulin. Cells in your body do not respond properly to insulin that your body makes (insulin resistance). Insulin resistance or lack of  insulin causes extra glucose to build up in the blood instead of going into cells. As a result, high blood glucose (hyperglycemia) develops. That can cause many complications. Being overweight or obese and having an inactive (sedentary) lifestyle can increase your risk for diabetes. Type 2 diabetes can be delayed or prevented by making certain nutrition and lifestyle changes. How can this condition affect me? If you do not take steps to prevent diabetes, your blood glucose levels may keep increasing over time. Too much glucose in your blood for a long time can damage your blood vessels, heart, kidneys, nerves, and eyes. Type 2 diabetes can lead to chronic health problems and complications, such as: Heart disease. Stroke. Blindness. Kidney disease. Depression. Poor circulation in your feet and legs. In severe cases, a foot or leg may need to be surgically removed (amputated). What can increase my risk? You may be more likely to develop type 2 diabetes if you: Have type 2 diabetes in your family. Are overweight or obese. Have a sedentary lifestyle. Have insulin resistance or a history of prediabetes. Have a history of pregnancy-related (gestational) diabetes or polycystic ovary syndrome (PCOS). What actions can I take to prevent this? It can be difficult to recognize signs of type 2 diabetes. Taking action to prevent the disease before you develop symptoms is the best way to avoid possible damage to your body. Making certain nutrition and lifestyle changes may prevent or delay the disease and related health problems. Nutrition  Eat healthy meals and snacks regularly. Do not skip meals. Fruit or a handful of nuts is a healthy snack between meals. Drink water throughout the day. Avoid drinks that contain added sugar, such as soda or sweetened tea. Drink enough fluid to keep your urine pale yellow. Follow instructions from your health care provider about eating or drinking restrictions. Limit the  amount of food you eat by: Managing how much you eat at a time (portion size). Checking food labels for the serving sizes of food. Using a kitchen scale to weigh amounts of food. Saut or steam food instead of frying it. Cook with water or broth instead of oils or butter. Limit saturated fat and salt (sodium) in your diet. Have no more than 1 tsp (2,400 mg) of sodium a day. If you have heart disease or high blood pressure, use less than ? tsp (1,500 mg) of sodium a day. Lifestyle  Lose weight if needed and as told. Your health care provider can determine how much weight loss is best for you and can help you lose weight safely. If you are overweight or obese, you may be told to lose at least 5?7% of your body weight. Manage blood pressure, cholesterol, and stress. Your health care provider will help determine the best treatment for you. Do not use any products that contain nicotine or tobacco. These products include cigarettes, chewing tobacco, and vaping devices, such as e-cigarettes. If you need help quitting, ask your health care provider. Activity  Do physical activity that makes your heart beat faster and makes you sweat (moderate intensity). Do this for at least 30 minutes on at least 5 days of the week, or as much as told by your health care provider. Ask your health care provider what activities are safe for you. A mix of activities may be best, such as walking, swimming, cycling, and strength training. Try to add physical activity into your day. For example: Park your car farther away than usual so that you walk more. Take a walk during your lunch break. Use stairs instead of elevators or escalators. Walk or bike to work instead of driving. Alcohol use If you drink alcohol: Limit how much you have to: 0?1 drink a day for women who are not pregnant. 0?2 drinks a day for men. Know how much alcohol is in your drink. In the [place] one drink equals one 12 oz bottle of beer (355 mL), one  5 oz glass of wine (148 mL), or one 1 oz glass of hard liquor (44 mL). General information Talk with your health care provider about your risk factors and how you can reduce your risk for diabetes. Have your blood glucose tested regularly, as told by your health care provider. Get screening tests as told by your health care provider. You may have these regularly, especially if you have certain risk factors for type 2 diabetes. Make an appointment with a registered dietitian. This diet and nutrition specialist can help you make a healthy eating plan and help you understand portion sizes and food labels. Where to find support Ask your health care provider to recommend a registered dietitian, a certified diabetes care and education specialist, or a weight loss program. Look for local or online weight loss groups. Join a gym, fitness club, or outdoor activity group, such as a walking club. Where to find more information For help and guidance and to learn more about diabetes and diabetes prevention, visit: American Diabetes Association (ADA): www.diabetes.[employer] of Diabetes and Digestive and Kidney Diseases: carflippers.tn To learn more about healthy eating, visit: U.S. Department of Agriculture ARCHITECT): https://ball-collins.biz/ Office of Disease Prevention and Health Promotion (ODPHP): researchname.uy Summary You can delay or prevent type 2 diabetes by eating healthy foods, losing weight if needed, and increasing your physical activity. Talk with your health care provider about your risk factors for type 2 diabetes and how you can reduce your risk. It can be difficult to recognize the signs of type 2 diabetes. The best way to avoid possible damage to your body is to take action to prevent the disease before you develop symptoms. Get screening tests as told by your health care provider. This information is not intended to replace advice given to you by your health care provider. Make sure  you discuss any questions you have with your health care provider. Document Revised: 12/20/2020 Document Reviewed: 12/20/2020 Elsevier Patient Education  2024 Elsevier  Inc.    Managing Your Hypertension Hypertension, also called high blood pressure, is when the force of the blood pressing against the walls of the arteries is too strong. Arteries are blood vessels that carry blood from your heart throughout your body. Hypertension forces the heart to work harder to pump blood and may cause the arteries to become narrow or stiff. Understanding blood pressure readings A blood pressure reading includes a higher number over a lower number: The first, or top, number is called the systolic pressure. It is a measure of the pressure in your arteries as your heart beats. The second, or bottom number, is called the diastolic pressure. It is a measure of the pressure in your arteries as the heart relaxes. For most people, a normal blood pressure is below 120/80. Your personal target blood pressure may vary depending on your medical conditions, your age, and other factors. Blood pressure is classified into four stages. Based on your blood pressure reading, your health care provider may use the following stages to determine what type of treatment you need, if any. Systolic pressure and diastolic pressure are measured in a unit called millimeters of mercury (mmHg). Normal Systolic pressure: below 120. Diastolic pressure: below 80. Elevated Systolic pressure: 120-129. Diastolic pressure: below 80. Hypertension stage 1 Systolic pressure: 130-139. Diastolic pressure: 80-89. Hypertension stage 2 Systolic pressure: 140 or above. Diastolic pressure: 90 or above. How can this condition affect me? Managing your hypertension is very important. Over time, hypertension can damage the arteries and decrease blood flow to parts of the body, including the brain, heart, and kidneys. Having untreated or uncontrolled  hypertension can lead to: A heart attack. A stroke. A weakened blood vessel (aneurysm). Heart failure. Kidney damage. Eye damage. Memory and concentration problems. Vascular dementia. What actions can I take to manage this condition? Hypertension can be managed by making lifestyle changes and possibly by taking medicines. Your health care provider will help you make a plan to bring your blood pressure within a normal range. You may be referred for counseling on a healthy diet and physical activity. Nutrition  Eat a diet that is high in fiber and potassium, and low in salt (sodium), added sugar, and fat. An example eating plan is called the DASH diet. DASH stands for Dietary Approaches to Stop Hypertension. To eat this way: Eat plenty of fresh fruits and vegetables. Try to fill one-half of your plate at each meal with fruits and vegetables. Eat whole grains, such as whole-wheat pasta, brown rice, or whole-grain bread. Fill about one-fourth of your plate with whole grains. Eat low-fat dairy products. Avoid fatty cuts of meat, processed or cured meats, and poultry with skin. Fill about one-fourth of your plate with lean proteins such as fish, chicken without skin, beans, eggs, and tofu. Avoid pre-made and processed foods. These tend to be higher in sodium, added sugar, and fat. Reduce your daily sodium intake. Many people with hypertension should eat less than 1,500 mg of sodium a day. Lifestyle  Work with your health care provider to maintain a healthy body weight or to lose weight. Ask what an ideal weight is for you. Get at least 30 minutes of exercise that causes your heart to beat faster (aerobic exercise) most days of the week. Activities may include walking, swimming, or biking. Include exercise to strengthen your muscles (resistance exercise), such as weight lifting, as part of your weekly exercise routine. Try to do these types of exercises for 30 minutes at least 3  days a week. Do not  use any products that contain nicotine or tobacco. These products include cigarettes, chewing tobacco, and vaping devices, such as e-cigarettes. If you need help quitting, ask your health care provider. Control any long-term (chronic) conditions you have, such as high cholesterol or diabetes. Identify your sources of stress and find ways to manage stress. This may include meditation, deep breathing, or making time for fun activities. Alcohol use Do not drink alcohol if: Your health care provider tells you not to drink. You are pregnant, may be pregnant, or are planning to become pregnant. If you drink alcohol: Limit how much you have to: 0-1 drink a day for women. 0-2 drinks a day for men. Know how much alcohol is in your drink. In the [place] one drink equals one 12 oz bottle of beer (355 mL), one 5 oz glass of wine (148 mL), or one 1 oz glass of hard liquor (44 mL). Medicines Your health care provider may prescribe medicine if lifestyle changes are not enough to get your blood pressure under control and if: Your systolic blood pressure is 130 or higher. Your diastolic blood pressure is 80 or higher. Take medicines only as told by your health care provider. Follow the directions carefully. Blood pressure medicines must be taken as told by your health care provider. The medicine does not work as well when you skip doses. Skipping doses also puts you at risk for problems. Monitoring Before you monitor your blood pressure: Do not smoke, drink caffeinated beverages, or exercise within 30 minutes before taking a measurement. Use the bathroom and empty your bladder (urinate). Sit quietly for at least 5 minutes before taking measurements. Monitor your blood pressure at home as told by your health care provider. To do this: Sit with your back straight and supported. Place your feet flat on the floor. Do not cross your legs. Support your arm on a flat surface, such as a table. Make sure your upper  arm is at heart level. Each time you measure, take two or three readings one minute apart and record the results. You may also need to have your blood pressure checked regularly by your health care provider. General information Talk with your health care provider about your diet, exercise habits, and other lifestyle factors that may be contributing to hypertension. Review all the medicines you take with your health care provider because there may be side effects or interactions. Keep all follow-up visits. Your health care provider can help you create and adjust your plan for managing your high blood pressure. Where to find more information National Heart, Lung, and Blood Institute: popsteam.is American Heart Association: www.heart.org Contact a health care provider if: You think you are having a reaction to medicines you have taken. You have repeated (recurrent) headaches. You feel dizzy. You have swelling in your ankles. You have trouble with your vision. Get help right away if: You develop a severe headache or confusion. You have unusual weakness or numbness, or you feel faint. You have severe pain in your chest or abdomen. You vomit repeatedly. You have trouble breathing. These symptoms may be an emergency. Get help right away. Call 911. Do not wait to see if the symptoms will go away. Do not drive yourself to the hospital. Summary Hypertension is when the force of blood pumping through your arteries is too strong. If this condition is not controlled, it may put you at risk for serious complications. Your personal target blood pressure may vary depending  on your medical conditions, your age, and other factors. For most people, a normal blood pressure is less than 120/80. Hypertension is managed by lifestyle changes, medicines, or both. Lifestyle changes to help manage hypertension include losing weight, eating a healthy, low-sodium diet, exercising more, stopping smoking, and  limiting alcohol. This information is not intended to replace advice given to you by your health care provider. Make sure you discuss any questions you have with your health care provider. Document Revised: 06/09/2021 Document Reviewed: 06/09/2021 Elsevier Patient Education  2024 [employer].

## 2024-09-23 ENCOUNTER — Ambulatory Visit: Admitting: Primary Care

## 2024-09-23 ENCOUNTER — Encounter: Payer: Self-pay | Admitting: Primary Care

## 2024-09-23 VITALS — BP 136/66 | HR 96 | Temp 97.9°F | Ht 66.5 in | Wt 263.5 lb

## 2024-09-23 DIAGNOSIS — R519 Headache, unspecified: Secondary | ICD-10-CM | POA: Insufficient documentation

## 2024-09-23 DIAGNOSIS — M7989 Other specified soft tissue disorders: Secondary | ICD-10-CM | POA: Insufficient documentation

## 2024-09-23 MED ORDER — PROPRANOLOL HCL ER 80 MG PO CP24: 80.0000 mg | ORAL_CAPSULE | Freq: Every day | ORAL | 0 refills | Status: AC

## 2024-09-23 NOTE — Patient Instructions (Signed)
 Start propranolol  ER 80 mg once daily for headache prevention.  You will receive a phone call regarding the ultrasounds.  Schedule your physical for early January 2026  It was a pleasure to see you today!

## 2024-09-23 NOTE — Assessment & Plan Note (Signed)
 Will obtain soft tissue ultrasound for further evaluation.  She agrees. Orders placed
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
## 2024-09-23 NOTE — Assessment & Plan Note (Signed)
 Blood pressure is mildly elevated here, high readings are higher. We discussed that if headaches are secondary to hypertension and we need to treat.  Will resume propranolol  ER 80 mg daily as this was effective previously.  If no improvement then we will proceed with antihypertensive treatment.  She will continue to monitor blood pressure at home.  Close follow-up in early January 2026

## 2024-09-24 ENCOUNTER — Other Ambulatory Visit: Payer: Self-pay

## 2024-09-24 DIAGNOSIS — G5601 Carpal tunnel syndrome, right upper limb: Secondary | ICD-10-CM

## 2024-09-25 ENCOUNTER — Telehealth: Payer: Self-pay

## 2024-09-25 NOTE — Patient Outreach (Signed)
 RNCM - patient currently at store with children, requesting reschedule after holidays and surgery 10/10/24. Agreed I will reschedule, will check appointment in MyChart and call to reschedule if needed.

## 2024-09-26 ENCOUNTER — Other Ambulatory Visit: Payer: Self-pay

## 2024-09-29 ENCOUNTER — Ambulatory Visit: Admission: RE | Admit: 2024-09-29 | Discharge: 2024-09-29 | Attending: Primary Care

## 2024-09-29 ENCOUNTER — Ambulatory Visit
Admission: RE | Admit: 2024-09-29 | Discharge: 2024-09-29 | Disposition: A | Discharge status: Home / Self Care | Source: Ambulatory Visit | Attending: Primary Care | Admitting: Primary Care

## 2024-09-29 DIAGNOSIS — M7989 Other specified soft tissue disorders: Secondary | ICD-10-CM

## 2024-09-29 DIAGNOSIS — R2231 Localized swelling, mass and lump, right upper limb: Secondary | ICD-10-CM | POA: Diagnosis not present

## 2024-09-29 DIAGNOSIS — R1900 Intra-abdominal and pelvic swelling, mass and lump, unspecified site: Secondary | ICD-10-CM | POA: Diagnosis not present

## 2024-09-29 DIAGNOSIS — J4531 Mild persistent asthma with (acute) exacerbation: Secondary | ICD-10-CM

## 2024-09-29 MED ORDER — PREDNISONE 20 MG PO TABS: ORAL_TABLET | ORAL | 0 refills | Status: DC

## 2024-09-29 NOTE — Telephone Encounter (Signed)
 Called patient to get more information she didn't answer. You will need her to be seen in office right?

## 2024-09-29 NOTE — Addendum Note (Signed)
 Addended by: Spencer Peterkin K on: 09/29/2024 01:33 PM   Modules accepted: Orders

## 2024-09-29 NOTE — Telephone Encounter (Addendum)
 Patient called back she states that she had left over prednisone  she started and it is starting to help. She only had 2 doses. Prednisone  was 20mg . She has taken a nebulizer of albuterol  last night and today. She has been using inhalers as well but when rescue inhaler didn't help she changed to breathing treatment. She would like sent to [facility] on [address]. She wanted to let you know she had ultrasounds that you ordered this morning.

## 2024-09-30 ENCOUNTER — Encounter (HOSPITAL_BASED_OUTPATIENT_CLINIC_OR_DEPARTMENT_OTHER): Payer: Self-pay | Admitting: Orthopedic Surgery

## 2024-10-07 ENCOUNTER — Ambulatory Visit: Payer: Self-pay | Admitting: Primary Care

## 2024-10-07 DIAGNOSIS — M7989 Other specified soft tissue disorders: Secondary | ICD-10-CM

## 2024-10-08 ENCOUNTER — Ambulatory Visit: Payer: Self-pay

## 2024-10-08 ENCOUNTER — Telehealth: Payer: Self-pay | Admitting: Orthopedic Surgery

## 2024-10-08 NOTE — Telephone Encounter (Signed)
 Agree that she needs an office visit.   Please call to check on her on 10/10/24.

## 2024-10-08 NOTE — Telephone Encounter (Signed)
 Pt reports green sputum, wheezing, SOB at rest. Encouraged to go to ED. Pt refused. Encouraged to go to UC. Pt refused. Pt states can Mallie just call something in for me? I have two sick kids at home. Pt advised the clinic will be sent a message.   FYI Only or Action Required?: Action required by provider: update on patient condition.  Patient was last seen in primary care on 09/23/2024 by Gretta Comer POUR, NP.  Called Nurse Triage reporting URI.  Symptoms began several days ago.  Interventions attempted: Prescription medications: inhaler.  Symptoms are: gradually worsening.  Triage Disposition: Go to ED Now (or PCP Triage)  Patient/caregiver understands and will follow disposition?: No, wishes to speak with PCP  Copied from CRM #[id number]. Topic: Clinical - Red Word Triage >> Oct 08, 2024  8:55 AM Viola F wrote: Patient has upper respiratory infection, wheezing, shortness of breath,  congestion, sinuses burning and swollen - Comer Gretta prescribed Prednisone  09/29/24 and patient symptoms are worsening Reason for Disposition  Patient sounds very sick or weak to the triager  Answer Assessment - Initial Assessment Questions 1. ONSET: When did the nasal discharge start?      Few days ago, following a recent asthma flare for which she took a course of prednisone  and briefly improved  2. AMOUNT: How much discharge is there?      A good bit  3. COUGH: Do you have a cough? If Yes, ask: Describe the color of your mucus. (e.g., clear, white, yellow, green)     Yes, with dark green sputum. I can taste the infection pt states.   4. RESPIRATORY DISTRESS: Describe your breathing.      Pt reports wheezing that improves with use of inhaler, history of asthma, reports she does not have albuterol  to use nebulizer. Reports constant SOB, worse with cough.   5. FEVER: Do you have a fever? If Yes, ask: What is your temperature, how was it measured, and when did it start?      Denies  6. SEVERITY: Overall, how bad are you feeling right now? (e.g., doesn't interfere with normal activities, staying home from school/work, staying in bed)      Oh yeah when asked if her symptoms interfere with her normal activities.   7. OTHER SYMPTOMS: Do you have any other symptoms? (e.g., earache, mouth sores, sore throat, wheezing) Sinuses burning, dark green sputum, cough  Answer Assessment - Initial Assessment Questions 10. O2 SATURATION MONITOR:  Do you use an oxygen saturation monitor (pulse oximeter) at home? If Yes, ask: What is your reading (oxygen level) today? What is your usual oxygen saturation reading? (e.g., 95%)       98% O2       83 HR  Protocols used: Common Cold-A-AH, Breathing Difficulty-A-AH

## 2024-10-08 NOTE — Telephone Encounter (Signed)
 Patient called to cancel surgery at [facility] for Friday 10-10-24.  Surgery case removed from grid and post op appointment has been cancelled. She has an upper respiratory infection.  Patient will reschedule when feeling better.

## 2024-10-10 ENCOUNTER — Encounter (HOSPITAL_BASED_OUTPATIENT_CLINIC_OR_DEPARTMENT_OTHER): Admission: RE | Payer: Self-pay | Source: Home / Self Care

## 2024-10-10 ENCOUNTER — Ambulatory Visit (HOSPITAL_BASED_OUTPATIENT_CLINIC_OR_DEPARTMENT_OTHER): Admission: RE | Admit: 2024-10-10 | Source: Home / Self Care | Admitting: Orthopedic Surgery

## 2024-10-10 ENCOUNTER — Ambulatory Visit: Admitting: Primary Care

## 2024-10-10 DIAGNOSIS — Z01818 Encounter for other preprocedural examination: Secondary | ICD-10-CM

## 2024-10-10 SURGERY — RELEASE, CARPAL TUNNEL, ENDOSCOPIC
Anesthesia: General | Laterality: Right

## 2024-10-10 NOTE — Telephone Encounter (Signed)
 Noted

## 2024-10-10 NOTE — Telephone Encounter (Signed)
 Called patient no change in symptoms refused UC have sent up for first available. Patient did not want to go to [facility] I have looked at all offices in [place]. Nothing available until Monday.  Reminded patient she should go to UC she declined and will be seen next week.

## 2024-10-12 NOTE — Progress Notes (Unsigned)
" ° ° ° °  Beverly Wright T. Cherylyn Sundby, MD, CAQ Sports Medicine [place] [facility] [address] [place], [address]  Phone: [phone]  FAX: [phone]  Beverly Wright - 54 y.o. female  MRN [id number]  Date of Birth: 1971-09-16  Date: 10/13/2024  PCP: Gretta Comer POUR, NP  Referral: Gretta Comer POUR, NP  No chief complaint on file.  Subjective:   Beverly Wright is a 54 y.o. very pleasant female patient with There is no height or weight on file to calculate BMI. who presents with the following:  Discussed the use of AI scribe software for clinical note transcription with the patient, who gave verbal consent to proceed.  [name] is here for an acute illness.  I recall her well, having seen her a number of times for postconcussive syndrome and neck pain last year.  Currently has some sputum production, wheezing, shortness of breath.  She does have 2 household members who are currently ill.  Dr. Gretta did prescribe prednisone  on September 29, 2024. History of Present Illness     Review of Systems is noted in the HPI, as appropriate  Objective:   LMP 11/10/2016   GEN: No acute distress; alert,appropriate. PULM: Breathing comfortably in no respiratory distress PSYCH: Normally interactive.   Laboratory and Imaging Data:  Assessment and Plan:   No diagnosis found. Assessment & Plan   Medication Management during today's office visit: No orders of the defined types were placed in this encounter.  There are no discontinued medications.  Orders placed today for conditions managed today: No orders of the defined types were placed in this encounter.   Disposition: No follow-ups on file.  [employer] speech-to-text software was used for transcription in this dictation.  Possible transcriptional errors can occur using Animal nutritionist.   Signed,  Jacques DASEN. Nyja Westbrook, MD   Outpatient Encounter Medications as of 10/13/2024  Medication  Sig   Cholecalciferol (VITAMIN D  PO) Take 1 capsule by mouth daily.   cyanocobalamin  500 MCG tablet Take 500 mcg by mouth daily.   famotidine  (PEPCID ) 20 MG tablet Take 20 mg by mouth every morning.   fluticasone  (FLOVENT  HFA) 110 MCG/ACT inhaler Inhale 1 puff into the lungs 2 (two) times daily. Rinse mouth after each use.   omeprazole (PRILOSEC) 10 MG capsule Take 10 mg by mouth daily.   oxycodone  (OXY-IR) 5 MG capsule Take 5 mg by mouth as needed for pain. Every 12hrs   predniSONE  (DELTASONE ) 20 MG tablet Take 2 tablets by mouth once daily in the morning for 5 days.   propranolol  ER (INDERAL  LA) 80 MG 24 hr capsule Take 1 capsule (80 mg total) by mouth daily. For headache prevention   senna (SENOKOT) 8.6 MG TABS tablet Take 1 tablet (8.6 mg total) by mouth 2 (two) times daily as needed for mild constipation.   VENTOLIN  HFA 108 (90 Base) MCG/ACT inhaler INHALE 1 TO 2 PUFFS BY MOUTH EVERY 6 HOURS AS NEEDED FOR WHEEZING AND FOR SHORTNESS OF BREATH   No facility-administered encounter medications on file as of 10/13/2024.   "

## 2024-10-13 ENCOUNTER — Ambulatory Visit: Admitting: Family Medicine

## 2024-10-13 ENCOUNTER — Encounter: Payer: Self-pay | Admitting: Family Medicine

## 2024-10-13 VITALS — BP 100/60 | HR 79 | Temp 97.3°F | Ht 66.5 in | Wt 259.2 lb

## 2024-10-13 DIAGNOSIS — J4520 Mild intermittent asthma, uncomplicated: Secondary | ICD-10-CM | POA: Diagnosis not present

## 2024-10-13 DIAGNOSIS — J208 Acute bronchitis due to other specified organisms: Secondary | ICD-10-CM | POA: Diagnosis not present

## 2024-10-13 DIAGNOSIS — J4531 Mild persistent asthma with (acute) exacerbation: Secondary | ICD-10-CM | POA: Diagnosis not present

## 2024-10-13 MED ORDER — VENTOLIN HFA 108 (90 BASE) MCG/ACT IN AERS: 2.0000 | INHALATION_SPRAY | RESPIRATORY_TRACT | 3 refills | Status: AC | PRN

## 2024-10-13 MED ORDER — PREDNISONE 20 MG PO TABS: ORAL_TABLET | ORAL | 0 refills | Status: DC

## 2024-10-13 MED ORDER — DOXYCYCLINE HYCLATE 100 MG PO TABS: 100.0000 mg | ORAL_TABLET | Freq: Two times a day (BID) | ORAL | 0 refills | Status: DC

## 2024-10-13 MED ORDER — FLUTICASONE PROPIONATE HFA 110 MCG/ACT IN AERO: 1.0000 | INHALATION_SPRAY | Freq: Two times a day (BID) | RESPIRATORY_TRACT | 3 refills | Status: AC

## 2024-10-15 ENCOUNTER — Ambulatory Visit: Admitting: Occupational Therapy

## 2024-10-15 ENCOUNTER — Telehealth: Payer: Self-pay

## 2024-10-22 ENCOUNTER — Ambulatory Visit: Attending: Orthopedic Surgery | Admitting: Occupational Therapy

## 2024-10-22 ENCOUNTER — Encounter: Payer: Self-pay | Admitting: Occupational Therapy

## 2024-10-22 ENCOUNTER — Other Ambulatory Visit: Payer: Self-pay

## 2024-10-22 DIAGNOSIS — M6281 Muscle weakness (generalized): Secondary | ICD-10-CM | POA: Diagnosis present

## 2024-10-22 DIAGNOSIS — M79642 Pain in left hand: Secondary | ICD-10-CM | POA: Diagnosis present

## 2024-10-22 DIAGNOSIS — R278 Other lack of coordination: Secondary | ICD-10-CM | POA: Diagnosis present

## 2024-10-22 DIAGNOSIS — R600 Localized edema: Secondary | ICD-10-CM | POA: Insufficient documentation

## 2024-10-22 DIAGNOSIS — G5601 Carpal tunnel syndrome, right upper limb: Secondary | ICD-10-CM | POA: Diagnosis present

## 2024-10-22 DIAGNOSIS — M1812 Unilateral primary osteoarthritis of first carpometacarpal joint, left hand: Secondary | ICD-10-CM | POA: Diagnosis present

## 2024-10-22 DIAGNOSIS — R208 Other disturbances of skin sensation: Secondary | ICD-10-CM | POA: Insufficient documentation
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
## 2024-10-23 ENCOUNTER — Encounter: Admitting: Orthopedic Surgery

## 2024-10-29 ENCOUNTER — Ambulatory Visit: Admitting: Occupational Therapy

## 2024-10-29 ENCOUNTER — Encounter: Payer: Self-pay | Admitting: Occupational Therapy

## 2024-10-29 DIAGNOSIS — R278 Other lack of coordination: Secondary | ICD-10-CM

## 2024-10-29 DIAGNOSIS — R208 Other disturbances of skin sensation: Secondary | ICD-10-CM

## 2024-10-29 DIAGNOSIS — M79642 Pain in left hand: Secondary | ICD-10-CM | POA: Diagnosis not present

## 2024-10-29 DIAGNOSIS — R600 Localized edema: Secondary | ICD-10-CM

## 2024-10-29 DIAGNOSIS — M6281 Muscle weakness (generalized): Secondary | ICD-10-CM
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
## 2024-11-04 ENCOUNTER — Ambulatory Visit: Payer: Self-pay | Admitting: General Surgery

## 2024-11-05 ENCOUNTER — Ambulatory Visit: Admitting: Occupational Therapy

## 2024-11-06 ENCOUNTER — Ambulatory Visit: Admitting: Occupational Therapy

## 2024-11-06 ENCOUNTER — Telehealth: Payer: Self-pay | Admitting: Occupational Therapy

## 2024-11-06 NOTE — Telephone Encounter (Signed)
 Called and spoke to patient about yesterday's and today's missed O.T. appointment. Pt said she was unaware of both appointments given the weather and her grandchildren being out of school. Pt was reminded of next appointment on 11/13/24. She was also reminded of no show policy and to call and cancel if unable to attend a session. (Pt's husband also a patient here and this therapist consulted with his therapist to remind them of his next appointment on 11/13/24 as well)

## 2024-11-12 ENCOUNTER — Telehealth: Payer: Self-pay | Admitting: Orthopedic Surgery

## 2024-11-12 NOTE — Telephone Encounter (Signed)
 I have received a vm from patient regarding a form from [facility] for LTD. Before I call her back, please advise it that is something we could do for her. Thank you!

## 2024-11-13 ENCOUNTER — Encounter: Payer: Self-pay | Admitting: General Surgery

## 2024-11-13 ENCOUNTER — Ambulatory Visit: Payer: Self-pay | Admitting: General Surgery

## 2024-11-13 ENCOUNTER — Ambulatory Visit: Attending: Orthopedic Surgery | Admitting: Occupational Therapy

## 2024-11-13 VITALS — BP 128/70 | HR 82 | Ht 66.0 in | Wt 258.0 lb

## 2024-11-13 DIAGNOSIS — R208 Other disturbances of skin sensation: Secondary | ICD-10-CM

## 2024-11-13 DIAGNOSIS — M6281 Muscle weakness (generalized): Secondary | ICD-10-CM

## 2024-11-13 DIAGNOSIS — M7989 Other specified soft tissue disorders: Secondary | ICD-10-CM

## 2024-11-13 DIAGNOSIS — R278 Other lack of coordination: Secondary | ICD-10-CM

## 2024-11-13 DIAGNOSIS — M1812 Unilateral primary osteoarthritis of first carpometacarpal joint, left hand: Secondary | ICD-10-CM

## 2024-11-13 DIAGNOSIS — R600 Localized edema: Secondary | ICD-10-CM

## 2024-11-13 DIAGNOSIS — M79642 Pain in left hand: Secondary | ICD-10-CM

## 2024-11-13 DIAGNOSIS — G5601 Carpal tunnel syndrome, right upper limb: Secondary | ICD-10-CM

## 2024-11-13 NOTE — Patient Instructions (Addendum)
 We have spoken today about removing a Lipoma. This will be done by Dr. Marinda at [facility].  If you are on any injectable weight loss medication, you will need to stop taking your GLP-1 injectable (weight loss) medications 8 days before your surgery to avoid any complications with anesthesia.   You will most likely be able to leave the hospital several hours after your surgery. You may need to have a drain placed after surgery, this depends on the size of your lipoma.  Plan to tentatively be off work for up to1 week following the surgery.  Please see your Blue surgery sheet for more information. Our surgery scheduler will call you to look at surgery dates and to go over information.   If you have FMLA or Disability paperwork that needs to be filled out, please have your company fax your paperwork to [phone] or you may drop this by either office. This paperwork will be filled out within 3 days after your surgery has been completed.  Lipoma Removal  Lipoma removal is a surgical procedure to remove a lipoma, which is a noncancerous (benign) tumor that is made up of fat cells. Most lipomas are small and painless and do not require treatment. They can form in many areas of the body but are most common under the skin of the back, arms, shoulders, buttocks, and thighs. You may need lipoma removal if you have a lipoma that is large, growing, or causing discomfort. Lipoma removal may also be done for cosmetic reasons. Tell a health care provider about: Any allergies you have. All medicines you are taking, including vitamins, herbs, eye drops, creams, and over-the-counter medicines. Any problems you or family members have had with anesthetic medicines. Any bleeding problems you have. Any surgeries you have had. Any medical conditions you have. Whether you are pregnant or may be pregnant. What are the risks? Generally, this is a safe procedure. However, problems may occur,  including: Infection. Bleeding. Scarring. Allergic reactions to medicines. Damage to nearby structures or organs, such as damage to nerves or blood vessels near the lipoma. What happens before the procedure? Medicines Ask your health care provider about: Changing or stopping your regular medicines. This is especially important if you are taking diabetes medicines or blood thinners. Taking medicines such as aspirin and ibuprofen. These medicines can thin your blood. Do not take these medicines unless your health care provider tells you to take them. Taking over-the-counter medicines, vitamins, herbs, and supplements. General instructions You will have a physical exam. Your health care provider will check the size of the lipoma and whether it can be removed easily. You may have a biopsy and imaging tests, such as X-rays, a CT scan, and an MRI. Do not use any products that contain nicotine or tobacco for at least 4 weeks before the procedure. These products include cigarettes, chewing tobacco, and vaping devices, such as e-cigarettes. If you need help quitting, ask your health care provider. Ask your health care provider: How your surgery site will be marked. What steps will be taken to help prevent infection. These may include: Washing skin with a germ-killing soap. Taking antibiotic medicine. If you will be going home right after the procedure, plan to have a responsible adult: Take you home from the hospital or clinic. You will not be allowed to drive. Care for you for the time you are told. What happens during the procedure?  An IV will be inserted into one of your veins. You will  be given one or more of the following: A medicine to help you relax (sedative). A medicine to numb the area (local anesthetic). A medicine to make you fall asleep (general anesthetic). A medicine that is injected into an area of your body to numb everything below the injection site (regional anesthetic). An  incision will be made into the skin over the lipoma or very near the lipoma. The incision may be made in a natural skin line or crease. Tissues, nerves, and blood vessels near the lipoma will be moved out of the way. The lipoma and the capsule that surrounds it will be separated from the surrounding tissues. The lipoma will be removed. You may have a drain placed depending on the size of your lipoma The incision may be closed with stitches (sutures). A bandage (dressing) will be placed over the incision. The procedure may vary among health care providers and hospitals. What happens after the procedure? Your blood pressure, heart rate, breathing rate, and blood oxygen level will be monitored until you leave the hospital or clinic. If you were prescribed an antibiotic medicine, use it as told by your health care provider. Do not stop using the antibiotic even if you start to feel better. If you were given a sedative during the procedure, it can affect you for several hours. Do not drive or operate machinery until your health care provider says that it is safe. Where to find more information OrthoInfo: orthoinfo.aaos.org Summary Before the procedure, follow instructions from your health care provider about eating and drinking, and changing or stopping your regular medicines. This is especially important if you are taking diabetes medicines or blood thinners. After the lipoma is removed, the incision may be closed with stitches (sutures) and covered with a bandage (dressing). If you were given a sedative during the procedure, it can affect you for several hours. Do not drive or operate machinery until your health care provider says that it is safe.
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
## 2024-11-14 ENCOUNTER — Telehealth: Payer: Self-pay | Admitting: General Surgery

## 2024-11-14 NOTE — Telephone Encounter (Signed)
 Patient has been advised of Pre-Admission date/time, and Surgery date at [facility].  Surgery Date: 12/12/24 Preadmission Testing Date: Preadmissions to call patient.    Patient informed of the scheduling process and surgery information given at time of office visit.   Patient has been made aware to call [phone], between 1-3:00pm the day before surgery, to find out what time to arrive for surgery.

## 2024-11-20 ENCOUNTER — Ambulatory Visit: Admitting: Occupational Therapy

## 2024-11-24 ENCOUNTER — Ambulatory Visit: Admitting: Orthopedic Surgery

## 2024-11-27 ENCOUNTER — Ambulatory Visit: Admitting: Occupational Therapy

## 2024-11-28 ENCOUNTER — Encounter (HOSPITAL_BASED_OUTPATIENT_CLINIC_OR_DEPARTMENT_OTHER): Payer: Self-pay

## 2024-11-28 ENCOUNTER — Ambulatory Visit (HOSPITAL_BASED_OUTPATIENT_CLINIC_OR_DEPARTMENT_OTHER): Admit: 2024-11-28 | Admitting: Orthopedic Surgery

## 2024-12-04 ENCOUNTER — Ambulatory Visit: Admitting: Occupational Therapy

## 2024-12-11 ENCOUNTER — Encounter: Admitting: Orthopedic Surgery

## 2024-12-12 ENCOUNTER — Ambulatory Visit: Admit: 2024-12-12 | Admitting: General Surgery
